# Patient Record
Sex: Male | Born: 1935 | Race: White | Hispanic: No | Smoking: Former smoker
Health system: Southern US, Community
[De-identification: ages and names within clinical notes are randomized; demographics above are authoritative.]

## PROBLEM LIST (undated history)

## (undated) DIAGNOSIS — Z8719 Personal history of other diseases of the digestive system: Secondary | ICD-10-CM

## (undated) DIAGNOSIS — Z87442 Personal history of urinary calculi: Secondary | ICD-10-CM

## (undated) DIAGNOSIS — E7800 Pure hypercholesterolemia, unspecified: Secondary | ICD-10-CM

## (undated) DIAGNOSIS — Z972 Presence of dental prosthetic device (complete) (partial): Secondary | ICD-10-CM

## (undated) DIAGNOSIS — J189 Pneumonia, unspecified organism: Secondary | ICD-10-CM

## (undated) DIAGNOSIS — IMO0002 Reserved for concepts with insufficient information to code with codable children: Secondary | ICD-10-CM

## (undated) DIAGNOSIS — I739 Peripheral vascular disease, unspecified: Secondary | ICD-10-CM

## (undated) DIAGNOSIS — E119 Type 2 diabetes mellitus without complications: Secondary | ICD-10-CM

## (undated) DIAGNOSIS — R519 Headache, unspecified: Secondary | ICD-10-CM

## (undated) DIAGNOSIS — R51 Headache: Secondary | ICD-10-CM

## (undated) DIAGNOSIS — I10 Essential (primary) hypertension: Secondary | ICD-10-CM

## (undated) DIAGNOSIS — K219 Gastro-esophageal reflux disease without esophagitis: Secondary | ICD-10-CM

## (undated) DIAGNOSIS — R42 Dizziness and giddiness: Secondary | ICD-10-CM

## (undated) HISTORY — PX: EYE SURGERY: SHX253

## (undated) HISTORY — PX: CHOLECYSTECTOMY: SHX55

## (undated) HISTORY — DX: Pure hypercholesterolemia, unspecified: E78.00

## (undated) HISTORY — PX: SKIN GRAFT: SHX250

## (undated) HISTORY — PX: DIAGNOSTIC LAPAROSCOPY: SUR761

## (undated) HISTORY — PX: VASCULAR SURGERY: SHX849

## (undated) HISTORY — PX: VEIN SURGERY: SHX48

## (undated) HISTORY — PX: MULTIPLE TOOTH EXTRACTIONS: SHX2053

---

## 2001-03-05 ENCOUNTER — Encounter: Admission: RE | Admit: 2001-03-05 | Discharge: 2001-06-03 | Payer: Self-pay | Admitting: Endocrinology

## 2001-05-15 ENCOUNTER — Ambulatory Visit (HOSPITAL_COMMUNITY): Admission: RE | Admit: 2001-05-15 | Discharge: 2001-05-15 | Payer: Self-pay | Admitting: Internal Medicine

## 2002-07-09 ENCOUNTER — Ambulatory Visit (HOSPITAL_COMMUNITY): Admission: RE | Admit: 2002-07-09 | Discharge: 2002-07-09 | Payer: Self-pay | Admitting: Family Medicine

## 2002-07-09 ENCOUNTER — Encounter: Payer: Self-pay | Admitting: Family Medicine

## 2002-07-14 ENCOUNTER — Observation Stay (HOSPITAL_COMMUNITY): Admission: RE | Admit: 2002-07-14 | Discharge: 2002-07-15 | Payer: Self-pay | Admitting: General Surgery

## 2002-08-07 ENCOUNTER — Ambulatory Visit (HOSPITAL_COMMUNITY): Admission: RE | Admit: 2002-08-07 | Discharge: 2002-08-07 | Payer: Self-pay | Admitting: Family Medicine

## 2002-08-07 ENCOUNTER — Encounter: Payer: Self-pay | Admitting: Family Medicine

## 2003-05-11 ENCOUNTER — Ambulatory Visit (HOSPITAL_COMMUNITY): Admission: RE | Admit: 2003-05-11 | Discharge: 2003-05-11 | Payer: Self-pay | Admitting: Family Medicine

## 2003-05-11 ENCOUNTER — Encounter: Payer: Self-pay | Admitting: Family Medicine

## 2003-05-19 ENCOUNTER — Ambulatory Visit (HOSPITAL_COMMUNITY): Admission: RE | Admit: 2003-05-19 | Discharge: 2003-05-19 | Payer: Self-pay | Admitting: Family Medicine

## 2003-05-19 ENCOUNTER — Encounter: Payer: Self-pay | Admitting: Family Medicine

## 2004-03-22 ENCOUNTER — Ambulatory Visit (HOSPITAL_COMMUNITY): Admission: RE | Admit: 2004-03-22 | Discharge: 2004-03-22 | Payer: Self-pay | Admitting: Family Medicine

## 2004-07-14 ENCOUNTER — Ambulatory Visit (HOSPITAL_COMMUNITY): Admission: RE | Admit: 2004-07-14 | Discharge: 2004-07-14 | Payer: Self-pay | Admitting: Family Medicine

## 2004-07-18 ENCOUNTER — Ambulatory Visit (HOSPITAL_COMMUNITY): Admission: RE | Admit: 2004-07-18 | Discharge: 2004-07-18 | Payer: Self-pay | Admitting: Family Medicine

## 2005-11-07 ENCOUNTER — Ambulatory Visit (HOSPITAL_COMMUNITY): Admission: RE | Admit: 2005-11-07 | Discharge: 2005-11-07 | Payer: Self-pay | Admitting: Family Medicine

## 2006-05-16 ENCOUNTER — Ambulatory Visit (HOSPITAL_COMMUNITY): Admission: RE | Admit: 2006-05-16 | Discharge: 2006-05-16 | Payer: Self-pay | Admitting: Family Medicine

## 2006-06-15 ENCOUNTER — Ambulatory Visit (HOSPITAL_COMMUNITY): Admission: RE | Admit: 2006-06-15 | Discharge: 2006-06-15 | Payer: Self-pay | Admitting: Urology

## 2007-01-30 ENCOUNTER — Ambulatory Visit (HOSPITAL_COMMUNITY): Admission: RE | Admit: 2007-01-30 | Discharge: 2007-01-30 | Payer: Self-pay | Admitting: Family Medicine

## 2008-12-16 ENCOUNTER — Ambulatory Visit (HOSPITAL_COMMUNITY): Admission: RE | Admit: 2008-12-16 | Discharge: 2008-12-16 | Payer: Self-pay | Admitting: Family Medicine

## 2011-05-05 NOTE — H&P (Signed)
[facility]  Patient:    [name], [name] Visit Number: [id number] MRN: [id number]          Service Type: OUT Location: RAD Attending Physician:  [name] Dictated by:   Franky Macho, M.D. Admit Date:  07/09/2002 Discharge Date: 07/09/2002   CC:         [name], M.D.   History and Physical  AGE:  77 years old.  CHIEF COMPLAINT:  Biliary colic secondary to cholelithiasis.  HISTORY OF PRESENT ILLNESS:  The patient is a 77 year old white male who is referred for evaluation and treatment of biliary colic secondary to cholelithiasis.  He has been having right upper quadrant abdominal pain with radiation to the back, nausea, vomiting, and bloating for several months.  It recently has worsened.  No fever, chills, jaundice, or history of peptic ulcer disease are noted.  PAST MEDICAL HISTORY:  Hypertension, non-insulin-dependent diabetes mellitus, psoriasis, coronary artery disease.  PAST SURGICAL HISTORY:  Colonoscopy one year ago which was unremarkable.  CURRENT MEDICATIONS: 1. Naprosyn 500 mg twice a day. 2. Methotrexate. 3. Altace 5 mg p.o. q.d. 4. Propranolol ER 80 mg p.o. q.d. 5. Hydrochlorothiazide 25 mg p.o. q.d. 6. Glucovance 2.5/500 mg p.o. q.d.  ALLERGIES:  PENICILLIN.  REVIEW OF SYSTEMS:  The patient denies any recent chest pain, shortness of breath, leg swelling, CVA, or bleeding disorder.  He does have a recent history of tachycardia, which is being handled by [facility]. He was noted to be in sinus tachycardia at 120 to 130.  PHYSICAL EXAMINATION:  GENERAL:  On physical examination, the patient is a well-developed, well-nourished white male in no acute distress.  VITAL SIGNS:  He is afebrile and pulse is noted to be 120 and regular.  HEENT:  No scleral icterus.  LUNGS:  Lungs are clear to auscultation with equal breath sounds bilaterally.  HEART:  Examination reveals a tachycardic rhythm  and without S3, S4, or murmurs.  ABDOMEN:  The abdomen is soft with tenderness noted in the right upper quadrant to palpation.  No masses or rigidity are noted.  LABORATORY AND ACCESSORY DATA:  Ultrasound of the gallbladder reveals cholelithiasis with gallbladder wall thickening.  A normal common bile duct is noted.  IMPRESSION:  Biliary colic, cholelithiasis.  PLAN:  The patient is scheduled to undergo a laparoscopic cholecystectomy on July 14, 2002; this will be done after his beta blocker medication is adjusted by [facility].  The risks and benefits of the procedure including bleeding, infection, hepatobiliary injury, and the possibility of an open procedure were fully explained to the patient, who gave informed consent. Dictated by:   Franky Macho, M.D. Attending Physician:  [name] DD:  07/10/02 TD:  07/11/02 Job: [id number]

## 2011-05-05 NOTE — Op Note (Signed)
[name], [name]                        ACCOUNT NO.:  [id number]   MEDICAL RECORD NO.:  [id number]                   PATIENT TYPE:  OBV   LOCATION:  A207                                 FACILITY:  [facility]   PHYSICIAN:  Dalia Heading, M.D.               DATE OF BIRTH:  1930-03-02   DATE OF PROCEDURE:  07/14/2002  DATE OF DISCHARGE:  07/15/2002                                 OPERATIVE REPORT   PREOPERATIVE DIAGNOSES:  Cholecystitis, cholelithiasis, methotrexate usage.   POSTOPERATIVE DIAGNOSES:  Cholecystitis, cholelithiasis, methotrexate usage.   PROCEDURE:  Laparoscopic cholecystectomy, liver biopsy.   SURGEON:  Dalia Heading, M.D.   ASSISTANT:  [name]. Leona Carry, M.D.   ANESTHESIA:  General endotracheal.   INDICATIONS FOR PROCEDURE:  The patient is a 77 year old white male who  presents with biliary colic secondary to cholelithiasis.  He also has been  on methotrexate for psoriasis.  The patient now comes to the operating room  for laparoscopic cholecystectomy with liver biopsy.  The risks and benefits  of the procedure including bleeding, infection, hepatobiliary injury, the  possibility of an open procedure were fully explained to the patient, who  gave informed consent.   DESCRIPTION OF PROCEDURE:  The patient was placed in the supine position.  After induction of general endotracheal anesthesia, the abdomen was prepped  and draped using the usual sterile technique with Betadine.  A  supraumbilical incision was made down to the fascia.  The Veress needle was  introduced into the abdominal cavity and confirmation of placement was done  using the saline drop test.  The abdomen was then insufflated to 16 mmHg  pressure.  An 11 mm trocar was introduced into the abdominal cavity under  direct visualization without difficulty.  The patient was placed in reversed  Trendelenburg position.  In addition, the 11 mm trocar was placed in the  epigastric region and 5 mm  trocars were placed in the right upper quadrant  and right flank regions.  The liver was inspected and noted to be within  normal limits.  Two TruCut needle biopsies of the liver were taken around  the gallbladder fossa.  These were sent off to pathology for further  examination due to his methotrexate usage.  Any bleeding was controlled  using Bovie electrocautery.  The gallbladder was then retracted superiorly  and laterally.  It was noted to be distended.  The cystic duct was first  identified.  The junction to the infundibulum fully identified.  Endoclips  were placed proximally and distally on the cystic duct and the cystic duct  was divided.  This was likewise done on the cystic artery.  The gallbladder  was then freed away from the gallbladder fossa using Bovie electrocautery.  The gallbladder was delivered through the epigastric trocar site using an  EndoCatch bag.  The gallbladder fossa was inspected and no abnormal bleeding  or bile leakage  was noted.  Surgicel was placed in the gallbladder fossa.  The subhepatic spaces as well as right hepatic gutter were irrigated with  normal saline.  All fluid and air were then evacuated from the abdominal  cavity prior to removal of the trocars.   All wounds were irrigated with normal saline.  All wounds were injected with  0.5% Sensorcaine.  The supraumbilical fascia was reapproximated using an 0  Vicryl interrupted suture.  All skin incisions were closed using staples.  Betadine ointment and dry sterile dressings were applied.  Needle, sponge,  and instrument count correct at the end of the procedure.  The patient  extubated in the operating room and went back to the recovery room awake and  in stable condition.  Complications were none.   SPECIMENS:  Gallbladder, liver biopsy.   BLOOD LOSS:  Minimal.                                               Dalia Heading, M.D.    MAJ/MEDQ  D:  07/14/2002  T:  07/17/2002  Job:  [id number]   cc:    Jonell Cluck, M.D.

## 2013-06-27 ENCOUNTER — Emergency Department (HOSPITAL_COMMUNITY): Payer: Medicare Other

## 2013-06-27 ENCOUNTER — Encounter (HOSPITAL_COMMUNITY): Payer: Self-pay

## 2013-06-27 ENCOUNTER — Emergency Department (HOSPITAL_COMMUNITY)
Admission: EM | Admit: 2013-06-27 | Discharge: 2013-06-27 | Disposition: A | Discharge status: Home / Self Care | Payer: Medicare Other | Attending: Emergency Medicine | Admitting: Emergency Medicine

## 2013-06-27 DIAGNOSIS — I10 Essential (primary) hypertension: Secondary | ICD-10-CM | POA: Insufficient documentation

## 2013-06-27 DIAGNOSIS — S0003XA Contusion of scalp, initial encounter: Secondary | ICD-10-CM | POA: Insufficient documentation

## 2013-06-27 DIAGNOSIS — S0181XA Laceration without foreign body of other part of head, initial encounter: Secondary | ICD-10-CM

## 2013-06-27 DIAGNOSIS — S0230XA Fracture of orbital floor, unspecified side, initial encounter for closed fracture: Secondary | ICD-10-CM | POA: Insufficient documentation

## 2013-06-27 DIAGNOSIS — Z8739 Personal history of other diseases of the musculoskeletal system and connective tissue: Secondary | ICD-10-CM | POA: Insufficient documentation

## 2013-06-27 DIAGNOSIS — R55 Syncope and collapse: Secondary | ICD-10-CM | POA: Insufficient documentation

## 2013-06-27 DIAGNOSIS — E119 Type 2 diabetes mellitus without complications: Secondary | ICD-10-CM | POA: Insufficient documentation

## 2013-06-27 DIAGNOSIS — R42 Dizziness and giddiness: Secondary | ICD-10-CM | POA: Insufficient documentation

## 2013-06-27 DIAGNOSIS — Z87891 Personal history of nicotine dependence: Secondary | ICD-10-CM | POA: Insufficient documentation

## 2013-06-27 DIAGNOSIS — W19XXXA Unspecified fall, initial encounter: Secondary | ICD-10-CM

## 2013-06-27 DIAGNOSIS — Z7982 Long term (current) use of aspirin: Secondary | ICD-10-CM | POA: Insufficient documentation

## 2013-06-27 DIAGNOSIS — S023XXA Fracture of orbital floor, initial encounter for closed fracture: Secondary | ICD-10-CM

## 2013-06-27 DIAGNOSIS — Z23 Encounter for immunization: Secondary | ICD-10-CM | POA: Insufficient documentation

## 2013-06-27 DIAGNOSIS — E785 Hyperlipidemia, unspecified: Secondary | ICD-10-CM | POA: Insufficient documentation

## 2013-06-27 DIAGNOSIS — Z79899 Other long term (current) drug therapy: Secondary | ICD-10-CM | POA: Insufficient documentation

## 2013-06-27 DIAGNOSIS — Y92009 Unspecified place in unspecified non-institutional (private) residence as the place of occurrence of the external cause: Secondary | ICD-10-CM | POA: Insufficient documentation

## 2013-06-27 DIAGNOSIS — W1809XA Striking against other object with subsequent fall, initial encounter: Secondary | ICD-10-CM | POA: Insufficient documentation

## 2013-06-27 DIAGNOSIS — Y9389 Activity, other specified: Secondary | ICD-10-CM | POA: Insufficient documentation

## 2013-06-27 DIAGNOSIS — S0180XA Unspecified open wound of other part of head, initial encounter: Secondary | ICD-10-CM | POA: Insufficient documentation

## 2013-06-27 DIAGNOSIS — Z880 Allergy status to penicillin: Secondary | ICD-10-CM | POA: Insufficient documentation

## 2013-06-27 HISTORY — DX: Type 2 diabetes mellitus without complications: E11.9

## 2013-06-27 HISTORY — DX: Dizziness and giddiness: R42

## 2013-06-27 HISTORY — DX: Essential (primary) hypertension: I10

## 2013-06-27 HISTORY — DX: Reserved for concepts with insufficient information to code with codable children: IMO0002

## 2013-06-27 MED ORDER — BACITRACIN ZINC 500 UNIT/GM EX OINT
TOPICAL_OINTMENT | CUTANEOUS | Status: AC
  Administered 2013-06-27: 1
  Filled 2013-06-27: qty 0.9

## 2013-06-27 MED ORDER — TETANUS-DIPHTH-ACELL PERTUSSIS 5-2.5-18.5 LF-MCG/0.5 IM SUSP
0.5000 mL | Freq: Once | INTRAMUSCULAR | Status: AC
  Administered 2013-06-27: 0.5 mL via INTRAMUSCULAR
  Filled 2013-06-27: qty 0.5

## 2013-06-27 MED ORDER — LIDOCAINE-EPINEPHRINE (PF) 1 %-1:200000 IJ SOLN
INTRAMUSCULAR | Status: AC
  Administered 2013-06-27: 10 mL
  Filled 2013-06-27: qty 10

## 2013-06-27 NOTE — ED Notes (Signed)
Pt skin cleaned with moist saline soaked gauze. Pt tolerated well. Pt site dressed with telfa and micropore tape.

## 2013-06-27 NOTE — ED Provider Notes (Signed)
History    This chart was scribed for Donnetta Hutching, MD, by Frederik Pear, ED scribe. The patient was seen in room AP[facility]2/AP[facility] and the patient's care was started at 1814.   CSN: [id number] Arrival date & time 06/27/13  1800  First MD Initiated Contact with Patient 06/27/13 1814     Chief Complaint  Patient presents with  . Head Injury  . Fall  . Dizziness  . Near Syncope   (Consider location/radiation/quality/duration/timing/severity/associated sxs/prior Treatment) The history is provided by the patient, medical records and a relative. No language interpreter was used.    HPI Comments: [name] is a 79 y.o. male with a h/o of Vertigo who presents to the Emergency Department complaining of a head injury that resulted in a left forehead laceration with bruising and swelling over the left eye that began at 17:30 while he was working in his yard and bent over to pull a patch of grass and suddenly became dizzy, which caused him to fall and hit his head on the concrete. Denies LOC. He denies neck pain, extremity pain, buccal pain. In ED, the laceration is hemostatic and wrapped with a bandage. Denies taking blood thinners. Reports he takes an aspirin daily.  H/o of DM and hyperlipidemia.   Past Medical History  Diagnosis Date  . Hypertension   . Diabetes mellitus without complication   . DDD (degenerative disc disease)   . Vertigo    Past Surgical History  Procedure Laterality Date  . Cholecystectomy     History reviewed. No pertinent family history. History  Substance Use Topics  . Smoking status: Former Games developer  . Smokeless tobacco: Not on file  . Alcohol Use: No    Review of Systems A complete 10 system review of systems was obtained and all systems are negative except as noted in the HPI and PMH.  Allergies  Penicillins  Home Medications   Current Outpatient Rx  Name  Route  Sig  Dispense  Refill  . Choline Fenofibrate (TRILIPIX PO)   Oral   Take 1 tablet by  mouth daily.         . metFORMIN (GLUCOPHAGE) 500 MG tablet   Oral   Take 500 mg by mouth 2 (two) times daily.         . methotrexate 2.5 MG tablet   Oral   Take 10 mg by mouth once a week. Takes every saturday         . OVER THE COUNTER MEDICATION   Oral   Take 1 tablet by mouth daily.         Marland Kitchen oxyCODONE-acetaminophen (PERCOCET/ROXICET) 5-325 MG per tablet   Oral   Take 1 tablet by mouth every 4 (four) hours as needed for pain.         Marland Kitchen propranolol (INDERAL) 80 MG tablet   Oral   Take 80 mg by mouth daily.         . rosuvastatin (CRESTOR) 10 MG tablet   Oral   Take 10 mg by mouth daily.         Marland Kitchen telmisartan (MICARDIS) 80 MG tablet   Oral   Take 80 mg by mouth daily.          BP 167/79  Pulse 89  Temp(Src) 98.1 F (36.7 C) (Oral)  Resp 20  Ht 6' (1.829 m)  Wt 196 lb (88.905 kg)  BMI 26.58 kg/m2  SpO2 95% Physical Exam  Nursing note and vitals reviewed. Constitutional:  He is oriented to person, place, and time. He appears well-developed and well-nourished.  HENT:  Head: Normocephalic and atraumatic.  5cm Y-shaped laceration to left forehead. Ecchymosis and swelling around the left eye. No left buccal tenderness.  Eyes: Conjunctivae and EOM are normal. Pupils are equal, round, and reactive to light.  Neck: Normal range of motion. Neck supple. No muscular tenderness present.  Neck is non-tender.  Cardiovascular: Normal rate, regular rhythm and normal heart sounds.   Pulmonary/Chest: Effort normal and breath sounds normal.  Abdominal: Soft. Bowel sounds are normal.  Musculoskeletal: Normal range of motion. He exhibits no tenderness.  No extremity tenderness.  Neurological: He is alert and oriented to person, place, and time.  Skin: Skin is warm and dry.  Psychiatric: He has a normal mood and affect.   ED Course  Procedures (including critical care time)  LACERATION REPAIR PROCEDURE NOTE The patient's identification was confirmed and consent  was obtained. This procedure was performed by Donnetta Hutching, MD at 20:03. Site: left forehead Sterile procedures observed: irrigated with normal saline Anesthetic used (type and amt): 7 cc of 1% xylocaine with epinephrine.  Suture type/size: 5/0 nylon Length: 5 cm # of Sutures: 7 Technique: simple interrupted Complexity: complex Antibx ointment applied: Neosporin Tetanus UTD or ordered: Ordered Site anesthetized, irrigated with NS, explored without evidence of foreign body, wound well approximated, site covered with dry, sterile dressing.  Patient tolerated procedure well without complications. Instructions for care discussed verbally and patient provided with additional written instructions for homecare and f/u.  DIAGNOSTIC STUDIES: Oxygen Saturation is 95% on room air, adequate by my interpretation.    COORDINATION OF CARE:  18:18- Discussed planned course of treatment with the patient, including repairing the laceration and maxillofacial and head CTs , who is agreeable at this time.  20:03- Repaired laceration. Pt reports his tetanus is not UTD.  Will order.  20:22- Discussed plan for discharge including suture removal in 7 days and taking NSAIDS as needed for pain.   Labs Reviewed - No data to display Ct Head Wo Contrast  06/27/2013   *RADIOLOGY REPORT*  Clinical Data:  Fall.  Head injury  CT HEAD WITHOUT CONTRAST CT CERVICAL SPINE WITHOUT CONTRAST  Technique:  Multidetector CT imaging of the head and cervical spine was performed following the standard protocol without intravenous contrast.  Multiplanar CT image reconstructions of the cervical spine were also generated.  Comparison:  None  CT HEAD  Findings: Generalized atrophy.  No acute infarct.  Negative for hemorrhage or mass.  Fracture of the left orbital floor.  See separate CT face report. There is fluid in the left maxillary sinus.  IMPRESSION: Generalized atrophy.  No acute intracranial abnormality.  CT CERVICAL SPINE   Findings: There is a markedly depressed fracture of the orbital floor on the left.  There is herniation of orbital fat and a portion of the inferior rectus into the fracture defect.  There is blood filling most of the left maxillary sinus.  There is soft tissue swelling overlying the left eye.  No other fracture identified in the face.  Degenerative changes present in the TMJ bilaterally.  IMPRESSION: Markedly depressed fracture of the left orbital floor with herniation of orbital fat and inferior rectus into the fracture defect.   Original Report Authenticated By: Janeece Riggers, M.D.   Ct Maxillofacial Wo Cm  06/27/2013   *RADIOLOGY REPORT*  Clinical Data:  Fall.  Head injury  CT HEAD WITHOUT CONTRAST CT CERVICAL SPINE WITHOUT CONTRAST  Technique:  Multidetector CT imaging of the head and cervical spine was performed following the standard protocol without intravenous contrast.  Multiplanar CT image reconstructions of the cervical spine were also generated.  Comparison:  None  CT HEAD  Findings: Generalized atrophy.  No acute infarct.  Negative for hemorrhage or mass.  Fracture of the left orbital floor.  See separate CT face report. There is fluid in the left maxillary sinus.  IMPRESSION: Generalized atrophy.  No acute intracranial abnormality.  CT CERVICAL SPINE  Findings: There is a markedly depressed fracture of the orbital floor on the left.  There is herniation of orbital fat and a portion of the inferior rectus into the fracture defect.  There is blood filling most of the left maxillary sinus.  There is soft tissue swelling overlying the left eye.  No other fracture identified in the face.  Degenerative changes present in the TMJ bilaterally.  IMPRESSION: Markedly depressed fracture of the left orbital floor with herniation of orbital fat and inferior rectus into the fracture defect.   Original Report Authenticated By: Janeece Riggers, M.D.   No diagnosis found.  MDM   CT scans of head face and cervical  spine reveal a depressed fracture of the left orbital floor.  There is also herniation of orbital fat and a portion of the inferior rectus into the fracture defect.   Patient has normal extraocular movements. I discussed this scenario with the otolaryngologist on call Dr. Jearld Fenton.  His opinion was that the patient could be seen as an outpatient next week.   Laceration was repaired.  CT findings were discussed with the patient and his son and grandson. He understands to get ENT followup.   Sutures out one week.    I personally performed the services described in this documentation, which was scribed in my presence. The recorded information has been reviewed and is accurate.    Donnetta Hutching, MD 06/28/13 [phone]

## 2013-06-27 NOTE — ED Notes (Signed)
Was outside in the driveway and reached over to pick up a piece of glass, everything started spinning and down I went per pt. Laceration above left eye, bandaged and bleeding controlled. Left eye swollen and black, abrasions to left cheek.

## 2015-07-26 ENCOUNTER — Ambulatory Visit (INDEPENDENT_AMBULATORY_CARE_PROVIDER_SITE_OTHER): Payer: Medicare Other | Admitting: Neurology

## 2015-07-26 ENCOUNTER — Ambulatory Visit (INDEPENDENT_AMBULATORY_CARE_PROVIDER_SITE_OTHER): Payer: Self-pay | Admitting: Neurology

## 2015-07-26 DIAGNOSIS — R200 Anesthesia of skin: Secondary | ICD-10-CM

## 2015-07-26 DIAGNOSIS — M5417 Radiculopathy, lumbosacral region: Secondary | ICD-10-CM | POA: Diagnosis not present

## 2015-07-26 DIAGNOSIS — R269 Unspecified abnormalities of gait and mobility: Secondary | ICD-10-CM

## 2015-07-26 DIAGNOSIS — Z0289 Encounter for other administrative examinations: Secondary | ICD-10-CM

## 2015-07-26 DIAGNOSIS — M5412 Radiculopathy, cervical region: Secondary | ICD-10-CM

## 2015-07-26 DIAGNOSIS — R2689 Other abnormalities of gait and mobility: Secondary | ICD-10-CM | POA: Diagnosis not present

## 2015-07-26 NOTE — Progress Notes (Signed)
Electrodiagnostic study today showed mild length dependent axonal peripheral neuropathy, evidence of chronic bilateral lumbosacral radiculopathy, chronic mild left cervical radiculopathy.

## 2015-07-26 NOTE — Procedures (Signed)
   NCS (NERVE CONDUCTION STUDY) WITH EMG (ELECTROMYOGRAPHY) REPORT   STUDY DATE: August 8th 2016 PATIENT NAME: [name] DOB: 29-Aug-1930 MRN: [id number]    TECHNOLOGIST: Gearldine Shown ELECTROMYOGRAPHER: Levert Feinstein M.D.  CLINICAL INFORMATION:  81 years old male, with gradual onset gait difficulty, sudden worsening since May 2016, known history of cervical myelopathy  FINDINGS: NERVE CONDUCTION STUDY: Right sural, peroneal sensory responses were absent.  Right tibial motor response was absent. Right peroneal to EDB motor response showed severely decreased C map amplitude, with normal distal latency, conduction velocity.  Bilateral ulnar sensory and motor responses were normal. Bilateral median sensory response showed moderately prolonged distal latency, with well-preserved snap amplitude.  Bilateral median motor responses showed moderately prolonged distal latency, was normal C map amplitude, conduction velocity, mild to moderately prolonged F-wave latency.    NEEDLE ELECTROMYOGRAPHY: Selected needle examination was performed at bilateral lower extremity muscles, bilateral lumbar sacral paraspinal muscles, left upper extremities, left cervical paraspinal muscles.   Bilateral tibialis anterior: Increased insertional activity, 1 plus spontaneous activity, enlarge complex motor unit potential, with mildly decreased recruitment patterns.  Left peroneal longus, medial gastrocnemius, tibialis posterior, vastus lateralis: Normal insertion activity, no spontaneous activity, enlarge complex motor unit potential, with mildly decreased recruitment patterns.  Right tibialis posterior, right vastus lateralis: Normal insertion activity, no spontaneous activity, mildly enlarged motor unit potential, with mildly decreased recruitment patterns.  He has increased insertional activity at bilateral lumbosacral paraspinal muscles, at L4, L5, S1. There was 1 plus spontaneous activity at right  L5.  Left pronator teres, biceps, triceps, deltoid, extensor digitorum communis: Normal insertion activity, no spontaneous activity, mildly enlarged complex motor unit potential, with mildly decreased recruitment patterns.      There was no spontaneous activity at left cervical paraspinal muscles, left C5, C6, C7.   IMPRESSION:   This is an abnormal study. 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 There was also evidence of chronic neuropathic changes involving bilateral lumbosacral myotomes, suggestive of chronic bilateral lumbosacral radiculopathy involving bilateral L4-5 S1 myotomes.  There was evidence of chronic left cervical radiculopathy, involving left C5, C6, 7 myotomes.    INTERPRETING PHYSICIAN:   Levert Feinstein M.D. Ph.D. [facility] [address], [address] [place], [place] [address] [phone]

## 2015-07-28 ENCOUNTER — Encounter: Payer: Self-pay | Admitting: Neurology

## 2015-07-28 ENCOUNTER — Ambulatory Visit (INDEPENDENT_AMBULATORY_CARE_PROVIDER_SITE_OTHER): Payer: Medicare Other | Admitting: Neurology

## 2015-07-28 VITALS — BP 169/76 | HR 68 | Ht 72.0 in | Wt 169.5 lb

## 2015-07-28 DIAGNOSIS — R32 Unspecified urinary incontinence: Secondary | ICD-10-CM | POA: Diagnosis not present

## 2015-07-28 DIAGNOSIS — R200 Anesthesia of skin: Secondary | ICD-10-CM | POA: Diagnosis not present

## 2015-07-28 DIAGNOSIS — R269 Unspecified abnormalities of gait and mobility: Secondary | ICD-10-CM

## 2015-07-28 NOTE — Progress Notes (Signed)
PATIENT: [name] DOB: 1930-06-23  Chief Complaint  Patient presents with  . Peripheral Neuropathy    He is here with his grandson, [name].  He is having numbness in his hands and feet.  He is unable to use his hands and has difficulty walking.  They would like to discuss his EMG/NCV results.     HISTORICAL  Quaran [name] is 81 years old ambidextrous male, accompanied by his grandson [name], seen in refer by Dy. Kohut for evaluation of difficulty walking, bilateral hands clumsiness, urinary incontinence   He has past medical history of hypertension, psoriatic arthritis,on chronic methotrexate treatment, hyperlipidemia, diabetes, known history of cervical spondylitic myelopathy, lumbar radiculopathy, was previously evaluated by neurosurgeon Dr. Trey Sailors, surgical decompression was offered  without dedicated ago, but patient decided to wait and see, he presented with gait difficulty severe low back pain then, which has gradually improved,  He was highly functional until around 2 years ago in 2014, he had a gradual onset gait difficulty, much worsened since June of 2016, he noticed dense numbness from waist down, spreading caudally to involve bilateral lower extremity, bilateral foot, worsening low back pain, to the point that he had fell down multiple times, numbness and clumsiness of his hands, dropping things from both hands, worsening urinary urgency, occasionally bowel and bladder incontinence, Electrodiagnostic study in August 2016 showed evidence of chronic bilateral lumbar radiculopathy, evidence of left cervical radiculopathy  He denies swallowing difficulty, mild slurred speech he attributed to his denture, no visual loss no memory loss  Review of system: 14 system review of the system was performed, only abnormality was numbness, weakness, dizziness, unsteady gait frequent falling     Allergies  Allergen Reactions  . Penicillins Other (See Comments)    unknown     HOME MEDICATIONS: Current Outpatient Prescriptions  Medication Sig Dispense Refill  . aspirin 81 MG tablet Take 81 mg by mouth daily.    . folic acid (FOLVITE) 1 MG tablet daily.    . hydrochlorothiazide (HYDRODIURIL) 25 MG tablet as needed.    . metFORMIN (GLUCOPHAGE) 500 MG tablet Take 500 mg by mouth 2 (two) times daily.    . methotrexate 2.5 MG tablet Take 10 mg by mouth once a week. Takes every saturday    . oxyCODONE-acetaminophen (PERCOCET/ROXICET) 5-325 MG per tablet Take 1 tablet by mouth every 4 (four) hours as needed for pain.    Marland Kitchen propranolol (INDERAL) 80 MG tablet Take 80 mg by mouth daily.    . rosuvastatin (CRESTOR) 10 MG tablet Take 10 mg by mouth daily.    Marland Kitchen telmisartan (MICARDIS) 80 MG tablet Take 80 mg by mouth daily.    . TRILIPIX 135 MG capsule      No current facility-administered medications for this visit.    PAST MEDICAL HISTORY: Past Medical History  Diagnosis Date  . Hypertension   . Diabetes mellitus without complication   . DDD (degenerative disc disease)   . Vertigo   . High cholesterol     PAST SURGICAL HISTORY: Past Surgical History  Procedure Laterality Date  . Cholecystectomy    . Skin graft      Left leg  . Vein surgery      FAMILY HISTORY: No family history on file.  SOCIAL HISTORY:  Social History   Social History  . Marital Status: Widowed    Spouse Name: N/A  . Number of Children: 1  . Years of Education: GED   Occupational  History  . Retired    Social History Main Topics  . Smoking status: Former Games developer  . Smokeless tobacco: Not on file     Comment: Quit smoking in 1979  . Alcohol Use: No  . Drug Use: No  . Sexual Activity: Not on file   Other Topics Concern  . Not on file   Social History Narrative   Lives at home with a caregiver.   Right-handed.   Occasional caffeine use.     PHYSICAL EXAM   Filed Vitals:   07/28/15 1216  BP: 169/76  Pulse: 68  Height: 6' (1.829 m)  Weight: 169 lb 8 oz  (76.885 kg)    Not recorded      Body mass index is 22.98 kg/(m^2).  PHYSICAL EXAMNIATION:  Gen: NAD, conversant, well nourised, obese, well groomed                     Cardiovascular: Regular rate rhythm, no peripheral edema, warm, nontender. Eyes: Conjunctivae clear without exudates or hemorrhage Neck: Supple, no carotid bruise. Pulmonary: Clear to auscultation bilaterally   NEUROLOGICAL EXAM:  MENTAL STATUS: Speech:    Speech is normal; fluent and spontaneous with normal comprehension.  Cognition:     Orientation to time, place and person     Normal recent and remote memory     Normal Attention span and concentration     Normal Language, naming, repeating,spontaneous speech     Fund of knowledge   CRANIAL NERVES: CN II: Visual fields are full to confrontation. Pupil equal round reactive to light. CN V: Facial sensation is intact to pinprick in all 3 divisions bilaterally. Corneal responses are intact.  CN VII: Face is symmetric with normal eye closure and smile. CN VIII: Hearing is normal to rubbing fingers CN IX, X: Palate elevates symmetrically. Phonation is normal. CN XI: Head turning and shoulder shrug are intact CN XII: Tongue is midline with normal movements and no atrophy.  MOTOR: Mild upper extremity spasticity, proximal muscle strength is normal, mild bilateral hand grip weakness, 4 plus out of 5, mild to moderate bilateral lower extremity spasticity, mild bilateral hip flexion, mild to moderate bilateral ankle dorsiflexion weakness  REFLEXES: Reflexes are  to  symmetric at the biceps, triceps,  2 at knees, and ankles. Plantar responses areextensor bilaterally.  SENSORY: Intact to light touch, pinprick, position sense, and vibration sense are intact in fingers and toes.  COORDINATION:  There is no dysmetria on finger-to-nose and heel-knee-shin.    GAIT/STANCE: He needs assistance to get up from seated position, wide-based, stiff, very unsteady  gait  DIAGNOSTIC DATA (LABS, IMAGING, TESTING) - I reviewed patient records, labs, notes, testing and imaging myself where available.   ASSESSMENT AND PLAN  [name] is a 81 y.o. male with numbness from with down, progressive gait difficulty, clumsiness of bilateral hands  Potential localization to cervical spondylitic myelopathy, thoracic myelopathy, lumbar radiculopathy  MRI of cervical, thoracic, lumbar spine  Return to clinic in one week   Levert Feinstein, M.D. Ph.D.  [facility] [address] [place], [place] [address] Ph: [phone] Fax: [phone]  CC: To Dr. Darci Needle

## 2015-08-12 ENCOUNTER — Ambulatory Visit
Admission: RE | Admit: 2015-08-12 | Discharge: 2015-08-12 | Disposition: A | Discharge status: Home / Self Care | Payer: Medicare Other | Source: Ambulatory Visit | Attending: Neurology | Admitting: Neurology

## 2015-08-12 DIAGNOSIS — R32 Unspecified urinary incontinence: Secondary | ICD-10-CM

## 2015-08-12 DIAGNOSIS — R200 Anesthesia of skin: Secondary | ICD-10-CM

## 2015-08-12 DIAGNOSIS — R269 Unspecified abnormalities of gait and mobility: Secondary | ICD-10-CM

## 2015-08-16 ENCOUNTER — Other Ambulatory Visit: Payer: Self-pay | Admitting: *Deleted

## 2015-08-16 ENCOUNTER — Telehealth: Payer: Self-pay | Admitting: Neurology

## 2015-08-16 ENCOUNTER — Other Ambulatory Visit: Payer: Self-pay | Admitting: Neurology

## 2015-08-16 DIAGNOSIS — M4802 Spinal stenosis, cervical region: Secondary | ICD-10-CM

## 2015-08-16 DIAGNOSIS — M48061 Spinal stenosis, lumbar region without neurogenic claudication: Secondary | ICD-10-CM

## 2015-08-16 DIAGNOSIS — G9520 Unspecified cord compression: Secondary | ICD-10-CM

## 2015-08-16 NOTE — Telephone Encounter (Signed)
Left message for [name] [phone]) to return my call.  Per Dr. Terrace Arabia, his grandfather will need to see a neurosurgeon.  Need to see if they have someone particular in mind or would [facility] be ok?  Dr. Terrace Arabia still wants him to keep his appoinment here to discuss him MRI on 08/24/15.

## 2015-08-16 NOTE — Telephone Encounter (Signed)
Spoke to [facility] - they would like a referral to [facility].  Faxed over an urgent request.

## 2015-08-16 NOTE — Telephone Encounter (Signed)
I have called, left message at both numbers for discussion.  Please tried to reach patient again, Significant abnormalities at MRI cervical and Lumbar spines. MRI-C, severe canal stenosis with cord compression at C2-3, C4-5. MRI-L, severe spinal stenosis at L3-4, L4-5 level with compression of nerve roots.  Degenerative changes to thoracic spines, no cord compression there.  IMPRESSION: This is an abnormal MRI of the lumbar spine showing the following: 1. Very severe spinal stenosis at L3-L4 could lead to a cauda equina syndrome or compression of any of the exiting or traversing nerve roots 2. Severe spinal stenosis at L4-L5 level compression of the exiting L4 nerve roots and the traversing left L5 nerve root 3. Moderate degenerative changes at L5-S1 with less potential for nerve root compression.  This is an abnormal MRI of the cervical spine without contrast showing the following: 1. There is severe spinal stenosis leading to spinal cord compression at C2-C3 and C4-C5. At C4-C5, there is hyperintense signal within the spinal cord superior to the point of maximum stenosis. At C2-C3, there is a hint of hyperintense signal within the spinal cord adjacent to the point of maximum stenosis. 2. There is pannus at the atlantoaxial joint adjacent to C1-C2 of a degenerative or inflammatory nature. 3. There is moderate to severe degenerative change at every cervical level. There could be right C4 nerve root compression at C3-C4, compression of either of the C5 nerve roots at C4-C5 and the right C6 nerve root at C5-C6.  4. There are extensive bridging osteophytes anteriorly and there appears to be fusion from C4-T1 of a degenerative nature.

## 2015-08-16 NOTE — Progress Notes (Signed)
Urgent neurosurgery referral placed on behalf of Dr. Terrace Arabia for patient with severe cervical spinal stenosis and cord compression at multiple levels. Also, severe lumbar spinal stenosis. MRI of the entire spine from 08/12/2015.

## 2015-08-16 NOTE — Telephone Encounter (Signed)
Spoke to his grandson, [name] (on HIP[employer] at [phone]), who will be bringing him to his appointment on 08/24/15 to further discuss his MRI findings and treatment options.  He is aware of results and verbalized understanding.

## 2015-08-18 NOTE — Telephone Encounter (Signed)
[facility] - patient has an appt with Dr. Dutch Quint on 08/19/15 at 10:45am.

## 2015-08-19 ENCOUNTER — Telehealth: Payer: Self-pay | Admitting: *Deleted

## 2015-08-19 ENCOUNTER — Other Ambulatory Visit: Payer: Self-pay | Admitting: Neurosurgery

## 2015-08-19 NOTE — Telephone Encounter (Signed)
Spoke with [name] (pt's grandson) - Mr. Valletta has surgery scheduled with Dr. Dutch Quint at [facility] on 09/06/15 at 11am. His 08/24/15 follow up appt will be canceled here.

## 2015-08-24 ENCOUNTER — Ambulatory Visit: Payer: Medicare Other | Admitting: Neurology

## 2015-09-02 ENCOUNTER — Encounter (HOSPITAL_COMMUNITY)
Admission: RE | Admit: 2015-09-02 | Discharge: 2015-09-02 | Disposition: A | Discharge status: Home / Self Care | Payer: Medicare Other | Source: Ambulatory Visit | Attending: Neurosurgery | Admitting: Neurosurgery

## 2015-09-02 ENCOUNTER — Encounter (HOSPITAL_COMMUNITY): Payer: Self-pay

## 2015-09-02 DIAGNOSIS — K219 Gastro-esophageal reflux disease without esophagitis: Secondary | ICD-10-CM | POA: Diagnosis not present

## 2015-09-02 DIAGNOSIS — Z01812 Encounter for preprocedural laboratory examination: Secondary | ICD-10-CM | POA: Insufficient documentation

## 2015-09-02 DIAGNOSIS — Z79899 Other long term (current) drug therapy: Secondary | ICD-10-CM | POA: Diagnosis not present

## 2015-09-02 DIAGNOSIS — E119 Type 2 diabetes mellitus without complications: Secondary | ICD-10-CM | POA: Diagnosis not present

## 2015-09-02 DIAGNOSIS — M4800 Spinal stenosis, site unspecified: Secondary | ICD-10-CM | POA: Insufficient documentation

## 2015-09-02 DIAGNOSIS — Z87891 Personal history of nicotine dependence: Secondary | ICD-10-CM | POA: Diagnosis not present

## 2015-09-02 DIAGNOSIS — I4510 Unspecified right bundle-branch block: Secondary | ICD-10-CM | POA: Insufficient documentation

## 2015-09-02 DIAGNOSIS — E785 Hyperlipidemia, unspecified: Secondary | ICD-10-CM | POA: Diagnosis not present

## 2015-09-02 DIAGNOSIS — Z7982 Long term (current) use of aspirin: Secondary | ICD-10-CM | POA: Insufficient documentation

## 2015-09-02 DIAGNOSIS — I10 Essential (primary) hypertension: Secondary | ICD-10-CM | POA: Diagnosis not present

## 2015-09-02 DIAGNOSIS — Z01818 Encounter for other preprocedural examination: Secondary | ICD-10-CM | POA: Diagnosis present

## 2015-09-02 DIAGNOSIS — I739 Peripheral vascular disease, unspecified: Secondary | ICD-10-CM | POA: Diagnosis not present

## 2015-09-02 HISTORY — DX: Personal history of urinary calculi: Z87.442

## 2015-09-02 HISTORY — DX: Headache: R51

## 2015-09-02 HISTORY — DX: Peripheral vascular disease, unspecified: I73.9

## 2015-09-02 HISTORY — DX: Headache, unspecified: R51.9

## 2015-09-02 HISTORY — DX: Presence of dental prosthetic device (complete) (partial): Z97.2

## 2015-09-02 HISTORY — DX: Pneumonia, unspecified organism: J18.9

## 2015-09-02 HISTORY — DX: Gastro-esophageal reflux disease without esophagitis: K21.9

## 2015-09-02 HISTORY — DX: Personal history of other diseases of the digestive system: Z87.19

## 2015-09-02 LAB — BASIC METABOLIC PANEL
Anion gap: 7 (ref 5–15)
BUN: 12 mg/dL (ref 6–20)
CALCIUM: 9.3 mg/dL (ref 8.9–10.3)
CO2: 28 mmol/L (ref 22–32)
CREATININE: 0.86 mg/dL (ref 0.61–1.24)
Chloride: 103 mmol/L (ref 101–111)
Glucose, Bld: 86 mg/dL (ref 65–99)
Potassium: 4.5 mmol/L (ref 3.5–5.1)
SODIUM: 138 mmol/L (ref 135–145)

## 2015-09-02 LAB — CBC WITH DIFFERENTIAL/PLATELET
BASOS PCT: 0 %
Basophils Absolute: 0 10*3/uL (ref 0.0–0.1)
EOS ABS: 0.2 10*3/uL (ref 0.0–0.7)
Eosinophils Relative: 2 %
HCT: 46.6 % (ref 39.0–52.0)
HEMOGLOBIN: 15.6 g/dL (ref 13.0–17.0)
Lymphocytes Relative: 22 %
Lymphs Abs: 2 10*3/uL (ref 0.7–4.0)
MCH: 30.4 pg (ref 26.0–34.0)
MCHC: 33.5 g/dL (ref 30.0–36.0)
MCV: 90.8 fL (ref 78.0–100.0)
MONOS PCT: 7 %
Monocytes Absolute: 0.7 10*3/uL (ref 0.1–1.0)
NEUTROS PCT: 69 %
Neutro Abs: 6.2 10*3/uL (ref 1.7–7.7)
Platelets: 143 10*3/uL — ABNORMAL LOW (ref 150–400)
RBC: 5.13 MIL/uL (ref 4.22–5.81)
RDW: 15 % (ref 11.5–15.5)
WBC: 9.1 10*3/uL (ref 4.0–10.5)

## 2015-09-02 LAB — GLUCOSE, CAPILLARY: GLUCOSE-CAPILLARY: 88 mg/dL (ref 65–99)

## 2015-09-02 LAB — SURGICAL PCR SCREEN
MRSA, PCR: NEGATIVE
STAPHYLOCOCCUS AUREUS: NEGATIVE

## 2015-09-02 NOTE — Progress Notes (Signed)
Patient states he has had ECHO/Stress tests done in the past, most likely over 10 years ago but cannot remember why, where, or when.  He also states he has been told he has an irregular HB but unsure exactly what.  Will ask Colette Ribas, MD PCP if they have any record of these tests and for last office visit note.

## 2015-09-02 NOTE — Pre-Procedure Instructions (Signed)
[name]  09/02/2015      [facility] - [place], [place] - [address] [place] [address] Phone: [phone] Fax: [phone]    Your procedure is scheduled on Monday September 06 2015 at 8am.  Report to [facility] Admitting at 600 A.M.  Call this number if you have problems the morning of surgery:  [phone]   Remember:  Do not eat food or drink liquids after midnight.  Take these medicines the morning of surgery with A SIP OF WATER Propanolol (Inderal).  If needed- Percocet (Oxycodone-acetaminophen)    STOP: ALL Vitamins, Supplements, Effient and Herbal Medications, Fish Oils, Aspirins, NSAIDs (Nonsteroidal Anti-inflammatories such as Ibuprofen, Aleve, or Advil), and Goody's/BC Powders 7 days prior to surgery (today Thursday Sept. 15), until after surgery as directed by your physician. This includes: Aspirin, Folic Acid.  Do not take metformin (diabetes pill) the morning of surgery.  How to Manage Your Diabetes Before Surgery   Why is it important to control my blood sugar before and after surgery?   Improving blood sugar levels before and after surgery helps healing and can limit problems.  A way of improving blood sugar control is eating a healthy diet by:  - Eating less sugar and carbohydrates  - Increasing activity/exercise  - Talk with your doctor about reaching your blood sugar goals  High blood sugars (greater than 180 mg/dL) can raise your risk of infections and slow down your recovery so you will need to focus on controlling your diabetes during the weeks before surgery.  Make sure that the doctor who takes care of your diabetes knows about your planned surgery including the date and location.  How do I manage my blood sugars before surgery?   Check your blood sugar at least 4 times a day, 2 days before surgery to make sure that they are not too high or low.   Check your blood sugar the morning of your  surgery when you wake up and every 2               hours until you get to the Short-Stay unit.  If your blood sugar is less than 70 mg/dL, you will need to treat for low blood sugar by:  Treat a low blood sugar (less than 70 mg/dL) with 1/2 cup of clear juice (cranberry or apple), 4 glucose tablets, OR glucose gel.  Recheck blood sugar in 15 minutes after treatment (to make sure it is greater than 70 mg/dL).  If blood sugar is not greater than 70 mg/dL on re-check, call [phone] for further instructions.   Report your blood sugar to the Short-Stay nurse when you get to Short-Stay.  References:  [facility], 2007 "How to Manage your Diabetes Before and After Surgery".  What do I do about my diabetes medications?   Do not take oral diabetes medicines (pills) the morning of surgery. (Metformin)     Do not wear jewelry.  Do not wear lotions, powders, or perfumes.  You may wear deodorant.  Men may shave face and neck.  Do not bring valuables to the hospital.  [facility] is not responsible for any belongings or valuables.  Contacts, dentures or bridgework may not be worn into surgery.  Leave your suitcase in the car.  After surgery it may be brought to your room.  For patients admitted to the hospital, discharge time will be determined by your treatment  team.  Patients discharged the day of surgery will not be allowed to drive home.   Special instructions:  Please follow these instructions carefully:  1. Shower with CHG Soap the night before surgery and the morning of Surgery. 2. If you choose to wash your hair, wash your hair first as usual with your normal shampoo. 3. After you shampoo, rinse your hair and body thoroughly to remove the Shampoo. 4. Use CHG as you would any other liquid soap. You can apply chg directly to the skin and wash gently with scrungie or a clean washcloth. 5.  Apply the CHG Soap to your body ONLY FROM THE NECK DOWN. Do not use on open wounds or open sores. Avoid contact with your eyes, ears, mouth and genitals (private parts). Wash genitals (private parts) with your normal soap. 6. Wash thoroughly, paying special attention to the area where your surgery will be performed. 7. Thoroughly rinse your body with warm water from the neck down. 8. DO NOT shower/wash with your normal soap after using and rinsing off the CHG Soap. 9. Pat yourself dry with a clean towel.  10. Wear clean pajamas.  11. Place clean sheets on your bed the night of your first shower and do not sleep with pets.  Day of Surgery  Do not apply any lotions/deodorants the morning of surgery. Please wear clean clothes to the hospital/surgery center.    Please read over the following fact sheets that you were given. Pain Booklet, Coughing and Deep Breathing, MRSA Information and Surgical Site Infection Prevention

## 2015-09-03 LAB — HEMOGLOBIN A1C
HEMOGLOBIN A1C: 6.1 % — AB (ref 4.8–5.6)
MEAN PLASMA GLUCOSE: 128 mg/dL

## 2015-09-03 NOTE — Progress Notes (Signed)
Anesthesia Chart Review: Patient is a 81 year old male scheduled for C2-3, C4-5 ACDF, L3-424 decompressive laminectomy 09/06/15 by Dr. Jordan Likes.  History includes former smoker, HTN, DM2, vertigo, severe spinal stenosis, hypercholesterolemia, GERD, PVD with "vein" surgery, cholecystectomy '03, skin graft left leg, psoriatic arthritis (methotrexate), dentures. PCP is Dr. Darci Needle. Neurologist is Dr. Levert Feinstein.  Meds include ASA 81 mg, Folvite, HCTZ, metformin, methotrexate, Percocet, Inderal, Crestor, Micardis.  09/02/15 EKG: SB at 52 bpm, right BBB, left atrial enlargement. No previous tracing in [place] or [address]. Patient reports a prior stress and echo, but likely > 10 years ago.   05/16/06 Carotid duplex: No hemodynamically significant ICA stenosis.  08/12/15 MRI: C-spine: IMPRESSION: This is an abnormal MRI of the cervical spine without contrast showing the following: 1. There is severe spinal stenosis leading to spinal cord compression at C2-C3 and C4-C5. At C4-C5, there is hyperintense signal within the spinal cord superior to the point of maximum stenosis. At C2-C3, there is a hint of hyperintense signal within the spinal cord adjacent to the point of maximum stenosis. 2. There is pannus at the atlantoaxial joint adjacent to C1-C2 of a degenerative or inflammatory nature. 3. There is moderate to severe degenerative change at every cervical level. There could be right C4 nerve root compression at C3-C4, compression of either of the C5 nerve roots at C4-C5 and the right C6 nerve root at C5-C6.  4. There are extensive bridging osteophytes anteriorly and there appears to be fusion from C4-T1 of a degenerative nature. T-spine: IMPRESSION: This is an abnormal MRI of the thoracic spine showing multilevel degenerative changes as detailed above. The spinal cord appears normal and there is no evidence of spinal cord compression, though there is mild spinal stenosis at T7-T8 due to posterior disc  protrusion and endplate degenerative changes and facet hypertrophy with joint effusions. 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 L-spine: IMPRESSION: This is an abnormal MRI of the lumbar spine showing the following: 1. Very severe spinal stenosis at L3-L4 could lead to a cauda equina syndrome or compression of any of the exiting or traversing nerve roots 2. Severe spinal stenosis at L4-L5 level compression of the exiting L4 nerve roots and the traversing left L5 nerve root 3. Moderate degenerative changes at L5-S1 with less potential for nerve root compression.  Preoperative labs noted. A1C 6.1.   Patient with severe cervical and lumbar stenosis. EKG shows right BBB. No comparison currently available. No CV symptoms documented. Further evaluation by his assigned anesthesiologist on the day of surgery, but if no acute changes then I would anticipate that he could proceed as planned.   Velna Ochs [facility] Short Stay Center/Anesthesiology Phone [phone] 09/03/2015 10:07 AM

## 2015-09-06 ENCOUNTER — Inpatient Hospital Stay (HOSPITAL_COMMUNITY)
Admission: RE | Admit: 2015-09-06 | Discharge: 2015-09-27 | DRG: 471 | Disposition: A | Discharge status: Skilled Nursing Facility | Payer: Medicare Other | Source: Ambulatory Visit | Attending: Neurosurgery | Admitting: Neurosurgery

## 2015-09-06 ENCOUNTER — Inpatient Hospital Stay (HOSPITAL_COMMUNITY): Payer: Medicare Other

## 2015-09-06 ENCOUNTER — Encounter (HOSPITAL_COMMUNITY)
Admission: RE | Disposition: A | Discharge status: Skilled Nursing Facility | Payer: Self-pay | Source: Ambulatory Visit | Attending: Neurosurgery

## 2015-09-06 ENCOUNTER — Inpatient Hospital Stay (HOSPITAL_COMMUNITY): Payer: Medicare Other | Admitting: Vascular Surgery

## 2015-09-06 ENCOUNTER — Inpatient Hospital Stay (HOSPITAL_COMMUNITY): Payer: Medicare Other | Admitting: Certified Registered"

## 2015-09-06 ENCOUNTER — Encounter (HOSPITAL_COMMUNITY): Payer: Self-pay | Admitting: *Deleted

## 2015-09-06 DIAGNOSIS — I6523 Occlusion and stenosis of bilateral carotid arteries: Secondary | ICD-10-CM | POA: Diagnosis present

## 2015-09-06 DIAGNOSIS — Z515 Encounter for palliative care: Secondary | ICD-10-CM | POA: Diagnosis not present

## 2015-09-06 DIAGNOSIS — T783XXA Angioneurotic edema, initial encounter: Secondary | ICD-10-CM

## 2015-09-06 DIAGNOSIS — G8190 Hemiplegia, unspecified affecting unspecified side: Secondary | ICD-10-CM | POA: Diagnosis not present

## 2015-09-06 DIAGNOSIS — D696 Thrombocytopenia, unspecified: Secondary | ICD-10-CM | POA: Diagnosis present

## 2015-09-06 DIAGNOSIS — R414 Neurologic neglect syndrome: Secondary | ICD-10-CM | POA: Diagnosis not present

## 2015-09-06 DIAGNOSIS — I6340 Cerebral infarction due to embolism of unspecified cerebral artery: Secondary | ICD-10-CM | POA: Diagnosis not present

## 2015-09-06 DIAGNOSIS — G936 Cerebral edema: Secondary | ICD-10-CM | POA: Diagnosis not present

## 2015-09-06 DIAGNOSIS — Z91048 Other nonmedicinal substance allergy status: Secondary | ICD-10-CM

## 2015-09-06 DIAGNOSIS — Z419 Encounter for procedure for purposes other than remedying health state, unspecified: Secondary | ICD-10-CM

## 2015-09-06 DIAGNOSIS — I9581 Postprocedural hypotension: Secondary | ICD-10-CM | POA: Diagnosis not present

## 2015-09-06 DIAGNOSIS — G9341 Metabolic encephalopathy: Secondary | ICD-10-CM | POA: Diagnosis not present

## 2015-09-06 DIAGNOSIS — I6522 Occlusion and stenosis of left carotid artery: Secondary | ICD-10-CM

## 2015-09-06 DIAGNOSIS — I481 Persistent atrial fibrillation: Secondary | ICD-10-CM | POA: Diagnosis not present

## 2015-09-06 DIAGNOSIS — R29898 Other symptoms and signs involving the musculoskeletal system: Secondary | ICD-10-CM

## 2015-09-06 DIAGNOSIS — R509 Fever, unspecified: Secondary | ICD-10-CM | POA: Diagnosis not present

## 2015-09-06 DIAGNOSIS — Z66 Do not resuscitate: Secondary | ICD-10-CM | POA: Diagnosis not present

## 2015-09-06 DIAGNOSIS — R29810 Facial weakness: Secondary | ICD-10-CM | POA: Diagnosis not present

## 2015-09-06 DIAGNOSIS — Y838 Other surgical procedures as the cause of abnormal reaction of the patient, or of later complication, without mention of misadventure at the time of the procedure: Secondary | ICD-10-CM | POA: Diagnosis not present

## 2015-09-06 DIAGNOSIS — I739 Peripheral vascular disease, unspecified: Secondary | ICD-10-CM | POA: Diagnosis not present

## 2015-09-06 DIAGNOSIS — I959 Hypotension, unspecified: Secondary | ICD-10-CM | POA: Diagnosis not present

## 2015-09-06 DIAGNOSIS — J9600 Acute respiratory failure, unspecified whether with hypoxia or hypercapnia: Secondary | ICD-10-CM | POA: Diagnosis not present

## 2015-09-06 DIAGNOSIS — Z7189 Other specified counseling: Secondary | ICD-10-CM | POA: Insufficient documentation

## 2015-09-06 DIAGNOSIS — E875 Hyperkalemia: Secondary | ICD-10-CM | POA: Diagnosis not present

## 2015-09-06 DIAGNOSIS — Z7982 Long term (current) use of aspirin: Secondary | ICD-10-CM | POA: Diagnosis not present

## 2015-09-06 DIAGNOSIS — Z7984 Long term (current) use of oral hypoglycemic drugs: Secondary | ICD-10-CM

## 2015-09-06 DIAGNOSIS — Z01818 Encounter for other preprocedural examination: Secondary | ICD-10-CM

## 2015-09-06 DIAGNOSIS — B9620 Unspecified Escherichia coli [E. coli] as the cause of diseases classified elsewhere: Secondary | ICD-10-CM | POA: Diagnosis not present

## 2015-09-06 DIAGNOSIS — E1151 Type 2 diabetes mellitus with diabetic peripheral angiopathy without gangrene: Secondary | ICD-10-CM | POA: Diagnosis present

## 2015-09-06 DIAGNOSIS — N390 Urinary tract infection, site not specified: Secondary | ICD-10-CM | POA: Diagnosis not present

## 2015-09-06 DIAGNOSIS — I6789 Other cerebrovascular disease: Secondary | ICD-10-CM | POA: Diagnosis not present

## 2015-09-06 DIAGNOSIS — M4802 Spinal stenosis, cervical region: Principal | ICD-10-CM | POA: Diagnosis present

## 2015-09-06 DIAGNOSIS — K219 Gastro-esophageal reflux disease without esophagitis: Secondary | ICD-10-CM | POA: Diagnosis present

## 2015-09-06 DIAGNOSIS — I119 Hypertensive heart disease without heart failure: Secondary | ICD-10-CM | POA: Diagnosis present

## 2015-09-06 DIAGNOSIS — B370 Candidal stomatitis: Secondary | ICD-10-CM | POA: Diagnosis not present

## 2015-09-06 DIAGNOSIS — J690 Pneumonitis due to inhalation of food and vomit: Secondary | ICD-10-CM | POA: Diagnosis not present

## 2015-09-06 DIAGNOSIS — I63511 Cerebral infarction due to unspecified occlusion or stenosis of right middle cerebral artery: Secondary | ICD-10-CM | POA: Diagnosis not present

## 2015-09-06 DIAGNOSIS — N990 Postprocedural (acute) (chronic) kidney failure: Secondary | ICD-10-CM | POA: Diagnosis not present

## 2015-09-06 DIAGNOSIS — I4891 Unspecified atrial fibrillation: Secondary | ICD-10-CM | POA: Diagnosis not present

## 2015-09-06 DIAGNOSIS — G8194 Hemiplegia, unspecified affecting left nondominant side: Secondary | ICD-10-CM | POA: Diagnosis not present

## 2015-09-06 DIAGNOSIS — Z880 Allergy status to penicillin: Secondary | ICD-10-CM

## 2015-09-06 DIAGNOSIS — T17908A Unspecified foreign body in respiratory tract, part unspecified causing other injury, initial encounter: Secondary | ICD-10-CM

## 2015-09-06 DIAGNOSIS — F17200 Nicotine dependence, unspecified, uncomplicated: Secondary | ICD-10-CM | POA: Insufficient documentation

## 2015-09-06 DIAGNOSIS — R262 Difficulty in walking, not elsewhere classified: Secondary | ICD-10-CM | POA: Diagnosis present

## 2015-09-06 DIAGNOSIS — I639 Cerebral infarction, unspecified: Secondary | ICD-10-CM

## 2015-09-06 DIAGNOSIS — I69322 Dysarthria following cerebral infarction: Secondary | ICD-10-CM | POA: Diagnosis not present

## 2015-09-06 DIAGNOSIS — I6529 Occlusion and stenosis of unspecified carotid artery: Secondary | ICD-10-CM | POA: Insufficient documentation

## 2015-09-06 DIAGNOSIS — I480 Paroxysmal atrial fibrillation: Secondary | ICD-10-CM

## 2015-09-06 DIAGNOSIS — J189 Pneumonia, unspecified organism: Secondary | ICD-10-CM | POA: Diagnosis not present

## 2015-09-06 DIAGNOSIS — E785 Hyperlipidemia, unspecified: Secondary | ICD-10-CM | POA: Diagnosis present

## 2015-09-06 DIAGNOSIS — I63411 Cerebral infarction due to embolism of right middle cerebral artery: Secondary | ICD-10-CM | POA: Diagnosis not present

## 2015-09-06 DIAGNOSIS — H510 Palsy (spasm) of conjugate gaze: Secondary | ICD-10-CM | POA: Diagnosis not present

## 2015-09-06 DIAGNOSIS — E876 Hypokalemia: Secondary | ICD-10-CM

## 2015-09-06 DIAGNOSIS — J9601 Acute respiratory failure with hypoxia: Secondary | ICD-10-CM

## 2015-09-06 DIAGNOSIS — Z79899 Other long term (current) drug therapy: Secondary | ICD-10-CM

## 2015-09-06 DIAGNOSIS — Z79891 Long term (current) use of opiate analgesic: Secondary | ICD-10-CM | POA: Diagnosis not present

## 2015-09-06 DIAGNOSIS — Z4659 Encounter for fitting and adjustment of other gastrointestinal appliance and device: Secondary | ICD-10-CM

## 2015-09-06 DIAGNOSIS — Z931 Gastrostomy status: Secondary | ICD-10-CM

## 2015-09-06 DIAGNOSIS — M4806 Spinal stenosis, lumbar region: Secondary | ICD-10-CM | POA: Diagnosis present

## 2015-09-06 DIAGNOSIS — R1313 Dysphagia, pharyngeal phase: Secondary | ICD-10-CM | POA: Diagnosis not present

## 2015-09-06 DIAGNOSIS — J9690 Respiratory failure, unspecified, unspecified whether with hypoxia or hypercapnia: Secondary | ICD-10-CM

## 2015-09-06 DIAGNOSIS — M48062 Spinal stenosis, lumbar region with neurogenic claudication: Secondary | ICD-10-CM | POA: Diagnosis present

## 2015-09-06 DIAGNOSIS — R1310 Dysphagia, unspecified: Secondary | ICD-10-CM | POA: Insufficient documentation

## 2015-09-06 HISTORY — PX: ANTERIOR CERVICAL DECOMP/DISCECTOMY FUSION: SHX1161

## 2015-09-06 HISTORY — PX: LUMBAR LAMINECTOMY/DECOMPRESSION MICRODISCECTOMY: SHX5026

## 2015-09-06 LAB — CBC
HCT: 42.6 % (ref 39.0–52.0)
Hemoglobin: 14.6 g/dL (ref 13.0–17.0)
MCH: 30.6 pg (ref 26.0–34.0)
MCHC: 34.3 g/dL (ref 30.0–36.0)
MCV: 89.3 fL (ref 78.0–100.0)
PLATELETS: 96 10*3/uL — AB (ref 150–400)
RBC: 4.77 MIL/uL (ref 4.22–5.81)
RDW: 15 % (ref 11.5–15.5)
WBC: 12.5 10*3/uL — AB (ref 4.0–10.5)

## 2015-09-06 LAB — COMPREHENSIVE METABOLIC PANEL
ALT: 12 U/L — AB (ref 17–63)
AST: 22 U/L (ref 15–41)
Albumin: 2.8 g/dL — ABNORMAL LOW (ref 3.5–5.0)
Alkaline Phosphatase: 73 U/L (ref 38–126)
Anion gap: 8 (ref 5–15)
BUN: 11 mg/dL (ref 6–20)
CHLORIDE: 105 mmol/L (ref 101–111)
CO2: 25 mmol/L (ref 22–32)
CREATININE: 1.04 mg/dL (ref 0.61–1.24)
Calcium: 8.6 mg/dL — ABNORMAL LOW (ref 8.9–10.3)
Glucose, Bld: 205 mg/dL — ABNORMAL HIGH (ref 65–99)
POTASSIUM: 4 mmol/L (ref 3.5–5.1)
Sodium: 138 mmol/L (ref 135–145)
TOTAL PROTEIN: 5.4 g/dL — AB (ref 6.5–8.1)
Total Bilirubin: 1.3 mg/dL — ABNORMAL HIGH (ref 0.3–1.2)

## 2015-09-06 LAB — BLOOD GAS, ARTERIAL
ACID-BASE EXCESS: 0.4 mmol/L (ref 0.0–2.0)
BICARBONATE: 24.7 meq/L — AB (ref 20.0–24.0)
Drawn by: 445891
FIO2: 0.3
O2 SAT: 98.1 %
PATIENT TEMPERATURE: 97.2
PO2 ART: 103 mmHg — AB (ref 80.0–100.0)
RATE: 16 resp/min
TCO2: 26 mmol/L (ref 0–100)
pCO2 arterial: 40.1 mmHg (ref 35.0–45.0)
pH, Arterial: 7.402 (ref 7.350–7.450)

## 2015-09-06 LAB — GLUCOSE, CAPILLARY
GLUCOSE-CAPILLARY: 122 mg/dL — AB (ref 65–99)
GLUCOSE-CAPILLARY: 161 mg/dL — AB (ref 65–99)
Glucose-Capillary: 188 mg/dL — ABNORMAL HIGH (ref 65–99)

## 2015-09-06 LAB — LACTIC ACID, PLASMA: Lactic Acid, Venous: 2.5 mmol/L (ref 0.5–2.0)

## 2015-09-06 LAB — TROPONIN I

## 2015-09-06 SURGERY — ANTERIOR CERVICAL DECOMPRESSION/DISCECTOMY FUSION 2 LEVELS
Anesthesia: General | Site: Back

## 2015-09-06 MED ORDER — PROPRANOLOL HCL ER 80 MG PO CP24
80.0000 mg | ORAL_CAPSULE | Freq: Every day | ORAL | Status: DC
  Filled 2015-09-06: qty 1

## 2015-09-06 MED ORDER — OXYCODONE-ACETAMINOPHEN 5-325 MG PO TABS: 1.0000 | ORAL_TABLET | ORAL | Status: DC | PRN

## 2015-09-06 MED ORDER — ASPIRIN 81 MG PO CHEW
81.0000 mg | CHEWABLE_TABLET | Freq: Every day | ORAL | Status: DC
  Administered 2015-09-06: 81 mg
  Filled 2015-09-06: qty 1

## 2015-09-06 MED ORDER — ROSUVASTATIN CALCIUM 10 MG PO TABS
10.0000 mg | ORAL_TABLET | Freq: Every day | ORAL | Status: DC
  Administered 2015-09-07 – 2015-09-27 (×17): 10 mg via ORAL
  Filled 2015-09-06 (×22): qty 1

## 2015-09-06 MED ORDER — HEMOSTATIC AGENTS (NO CHARGE) OPTIME
TOPICAL | Status: DC | PRN
  Administered 2015-09-06: 1 via TOPICAL

## 2015-09-06 MED ORDER — DEXAMETHASONE SODIUM PHOSPHATE 4 MG/ML IJ SOLN
4.0000 mg | Freq: Four times a day (QID) | INTRAMUSCULAR | Status: DC
  Administered 2015-09-06 – 2015-09-09 (×11): 4 mg via INTRAVENOUS
  Filled 2015-09-06 (×11): qty 1

## 2015-09-06 MED ORDER — FOLIC ACID 1 MG PO TABS
1.0000 mg | ORAL_TABLET | Freq: Every day | ORAL | Status: DC
  Administered 2015-09-07 – 2015-09-27 (×17): 1 mg via ORAL
  Filled 2015-09-06 (×19): qty 1

## 2015-09-06 MED ORDER — PHENYLEPHRINE 40 MCG/ML (10ML) SYRINGE FOR IV PUSH (FOR BLOOD PRESSURE SUPPORT)
PREFILLED_SYRINGE | INTRAVENOUS | Status: AC
  Filled 2015-09-06: qty 10

## 2015-09-06 MED ORDER — EPHEDRINE SULFATE 50 MG/ML IJ SOLN
INTRAMUSCULAR | Status: AC
  Filled 2015-09-06: qty 1

## 2015-09-06 MED ORDER — PHENYLEPHRINE HCL 10 MG/ML IJ SOLN: 0.0000 ug/min | INTRAVENOUS | Status: DC

## 2015-09-06 MED ORDER — SODIUM CHLORIDE 0.9 % IV BOLUS (SEPSIS): 500.0000 mL | Freq: Once | INTRAVENOUS | Status: AC

## 2015-09-06 MED ORDER — BUPIVACAINE HCL (PF) 0.25 % IJ SOLN
INTRAMUSCULAR | Status: DC | PRN
  Administered 2015-09-06: 20 mL

## 2015-09-06 MED ORDER — HYDROMORPHONE HCL 1 MG/ML IJ SOLN
0.2500 mg | INTRAMUSCULAR | Status: DC | PRN
  Administered 2015-09-06: 0.5 mg via INTRAVENOUS

## 2015-09-06 MED ORDER — SODIUM CHLORIDE 0.9 % IJ SOLN
3.0000 mL | Freq: Two times a day (BID) | INTRAMUSCULAR | Status: DC
  Administered 2015-09-06 – 2015-09-08 (×3): 3 mL via INTRAVENOUS
  Administered 2015-09-08: 10 mL via INTRAVENOUS
  Administered 2015-09-09 – 2015-09-11 (×6): 3 mL via INTRAVENOUS

## 2015-09-06 MED ORDER — IRBESARTAN 300 MG PO TABS: 300.0000 mg | ORAL_TABLET | Freq: Every day | ORAL | Status: DC

## 2015-09-06 MED ORDER — METFORMIN HCL 500 MG PO TABS: 500.0000 mg | ORAL_TABLET | Freq: Two times a day (BID) | ORAL | Status: DC

## 2015-09-06 MED ORDER — ALBUMIN HUMAN 5 % IV SOLN
INTRAVENOUS | Status: AC
  Administered 2015-09-06: 12.5 g
  Filled 2015-09-06: qty 250

## 2015-09-06 MED ORDER — ASPIRIN EC 81 MG PO TBEC: 81.0000 mg | DELAYED_RELEASE_TABLET | Freq: Every day | ORAL | Status: DC

## 2015-09-06 MED ORDER — STERILE WATER FOR INJECTION IJ SOLN
INTRAMUSCULAR | Status: AC
  Filled 2015-09-06: qty 10

## 2015-09-06 MED ORDER — PANTOPRAZOLE SODIUM 40 MG IV SOLR
40.0000 mg | Freq: Every day | INTRAVENOUS | Status: DC
  Administered 2015-09-06 – 2015-09-26 (×21): 40 mg via INTRAVENOUS
  Filled 2015-09-06 (×21): qty 40

## 2015-09-06 MED ORDER — PROPOFOL 10 MG/ML IV BOLUS
INTRAVENOUS | Status: AC
  Filled 2015-09-06: qty 20

## 2015-09-06 MED ORDER — ATROPINE SULFATE 0.1 MG/ML IJ SOLN
INTRAMUSCULAR | Status: AC
  Filled 2015-09-06: qty 10

## 2015-09-06 MED ORDER — ACETAMINOPHEN 325 MG PO TABS
650.0000 mg | ORAL_TABLET | ORAL | Status: DC | PRN
  Administered 2015-09-07 – 2015-09-26 (×11): 650 mg via ORAL
  Filled 2015-09-06 (×11): qty 2

## 2015-09-06 MED ORDER — ONDANSETRON HCL 4 MG/2ML IJ SOLN
INTRAMUSCULAR | Status: DC | PRN
  Administered 2015-09-06: 4 mg via INTRAVENOUS

## 2015-09-06 MED ORDER — PROPOFOL 10 MG/ML IV BOLUS
INTRAVENOUS | Status: DC | PRN
  Administered 2015-09-06: 100 mg via INTRAVENOUS

## 2015-09-06 MED ORDER — DEXMEDETOMIDINE HCL IN NACL 400 MCG/100ML IV SOLN
INTRAVENOUS | Status: DC | PRN
  Administered 2015-09-06: .4 ug/kg/h via INTRAVENOUS

## 2015-09-06 MED ORDER — MENTHOL 3 MG MT LOZG: 1.0000 | LOZENGE | OROMUCOSAL | Status: DC | PRN

## 2015-09-06 MED ORDER — VECURONIUM BROMIDE 10 MG IV SOLR
INTRAVENOUS | Status: DC | PRN
  Administered 2015-09-06 (×3): 2 mg via INTRAVENOUS

## 2015-09-06 MED ORDER — HYDROCODONE-ACETAMINOPHEN 5-325 MG PO TABS: 1.0000 | ORAL_TABLET | ORAL | Status: DC | PRN

## 2015-09-06 MED ORDER — PHENYLEPHRINE HCL 10 MG/ML IJ SOLN
INTRAMUSCULAR | Status: DC | PRN
  Administered 2015-09-06 (×2): 80 ug via INTRAVENOUS
  Administered 2015-09-06: 40 ug via INTRAVENOUS
  Administered 2015-09-06: 120 ug via INTRAVENOUS
  Administered 2015-09-06 (×7): 80 ug via INTRAVENOUS

## 2015-09-06 MED ORDER — HYDROMORPHONE HCL 1 MG/ML IJ SOLN: 0.5000 mg | INTRAMUSCULAR | Status: DC | PRN

## 2015-09-06 MED ORDER — 0.9 % SODIUM CHLORIDE (POUR BTL) OPTIME
TOPICAL | Status: DC | PRN
  Administered 2015-09-06: 1000 mL

## 2015-09-06 MED ORDER — LIDOCAINE HCL (CARDIAC) 20 MG/ML IV SOLN
INTRAVENOUS | Status: AC
  Filled 2015-09-06: qty 5

## 2015-09-06 MED ORDER — SODIUM CHLORIDE 0.9 % IV BOLUS (SEPSIS)
500.0000 mL | Freq: Once | INTRAVENOUS | Status: AC
  Administered 2015-09-06: 500 mL via INTRAVENOUS

## 2015-09-06 MED ORDER — SUCCINYLCHOLINE CHLORIDE 20 MG/ML IJ SOLN
INTRAMUSCULAR | Status: AC
  Filled 2015-09-06: qty 1

## 2015-09-06 MED ORDER — CYCLOBENZAPRINE HCL 10 MG PO TABS
10.0000 mg | ORAL_TABLET | Freq: Three times a day (TID) | ORAL | Status: DC | PRN
  Administered 2015-09-13: 10 mg via ORAL
  Filled 2015-09-06: qty 1

## 2015-09-06 MED ORDER — VANCOMYCIN HCL IN DEXTROSE 1-5 GM/200ML-% IV SOLN
1000.0000 mg | INTRAVENOUS | Status: AC
  Administered 2015-09-06: 1000 mg via INTRAVENOUS
  Filled 2015-09-06: qty 200

## 2015-09-06 MED ORDER — CHLORHEXIDINE GLUCONATE 0.12% ORAL RINSE (MEDLINE KIT)
15.0000 mL | Freq: Two times a day (BID) | OROMUCOSAL | Status: DC
  Administered 2015-09-06 – 2015-09-08 (×4): 15 mL via OROMUCOSAL

## 2015-09-06 MED ORDER — FAMOTIDINE IN NACL 20-0.9 MG/50ML-% IV SOLN
20.0000 mg | INTRAVENOUS | Status: DC
  Administered 2015-09-06 – 2015-09-08 (×3): 20 mg via INTRAVENOUS
  Filled 2015-09-06 (×3): qty 50

## 2015-09-06 MED ORDER — FENTANYL CITRATE (PF) 100 MCG/2ML IJ SOLN
50.0000 ug | INTRAMUSCULAR | Status: DC | PRN
  Administered 2015-09-07 – 2015-09-12 (×11): 50 ug via INTRAVENOUS
  Filled 2015-09-06 (×9): qty 2

## 2015-09-06 MED ORDER — ROCURONIUM BROMIDE 100 MG/10ML IV SOLN
INTRAVENOUS | Status: DC | PRN
  Administered 2015-09-06: 10 mg via INTRAVENOUS
  Administered 2015-09-06: 40 mg via INTRAVENOUS

## 2015-09-06 MED ORDER — DIPHENHYDRAMINE HCL 50 MG/ML IJ SOLN
25.0000 mg | Freq: Four times a day (QID) | INTRAMUSCULAR | Status: DC
  Administered 2015-09-06 – 2015-09-09 (×11): 25 mg via INTRAVENOUS
  Filled 2015-09-06 (×11): qty 1

## 2015-09-06 MED ORDER — ALBUMIN HUMAN 5 % IV SOLN: 12.5000 g | Freq: Once | INTRAVENOUS | Status: DC

## 2015-09-06 MED ORDER — GLYCOPYRROLATE 0.2 MG/ML IJ SOLN
INTRAMUSCULAR | Status: AC
  Filled 2015-09-06: qty 1

## 2015-09-06 MED ORDER — SODIUM CHLORIDE 0.9 % IV SOLN: 250.0000 mL | INTRAVENOUS | Status: DC

## 2015-09-06 MED ORDER — NEOSTIGMINE METHYLSULFATE 10 MG/10ML IV SOLN
INTRAVENOUS | Status: DC | PRN
  Administered 2015-09-06: 4 mg via INTRAVENOUS

## 2015-09-06 MED ORDER — VECURONIUM BROMIDE 10 MG IV SOLR
INTRAVENOUS | Status: AC
  Filled 2015-09-06: qty 10

## 2015-09-06 MED ORDER — EPHEDRINE SULFATE 50 MG/ML IJ SOLN
INTRAMUSCULAR | Status: DC | PRN
  Administered 2015-09-06: 10 mg via INTRAVENOUS
  Administered 2015-09-06: 5 mg via INTRAVENOUS
  Administered 2015-09-06: 10 mg via INTRAVENOUS
  Administered 2015-09-06: 5 mg via INTRAVENOUS
  Administered 2015-09-06 (×2): 10 mg via INTRAVENOUS

## 2015-09-06 MED ORDER — ANTISEPTIC ORAL RINSE SOLUTION (CORINZ)
7.0000 mL | Freq: Four times a day (QID) | OROMUCOSAL | Status: DC
  Administered 2015-09-06 – 2015-09-08 (×8): 7 mL via OROMUCOSAL

## 2015-09-06 MED ORDER — ALBUMIN HUMAN 5 % IV SOLN
12.5000 g | Freq: Once | INTRAVENOUS | Status: AC
  Administered 2015-09-06: 12.5 g via INTRAVENOUS
  Filled 2015-09-06: qty 250

## 2015-09-06 MED ORDER — GLYCOPYRROLATE 0.2 MG/ML IJ SOLN
INTRAMUSCULAR | Status: AC
  Filled 2015-09-06: qty 2

## 2015-09-06 MED ORDER — FENTANYL CITRATE (PF) 100 MCG/2ML IJ SOLN
50.0000 ug | INTRAMUSCULAR | Status: DC | PRN
  Administered 2015-09-07: 50 ug via INTRAVENOUS
  Filled 2015-09-06 (×3): qty 2

## 2015-09-06 MED ORDER — DEXAMETHASONE SODIUM PHOSPHATE 10 MG/ML IJ SOLN
10.0000 mg | INTRAMUSCULAR | Status: AC
Start: 2015-09-06 — End: 2015-09-06
  Administered 2015-09-06 (×2): 10 mg via INTRAVENOUS
  Filled 2015-09-06: qty 1

## 2015-09-06 MED ORDER — DEXAMETHASONE 4 MG PO TABS: 4.0000 mg | ORAL_TABLET | Freq: Four times a day (QID) | ORAL | Status: DC

## 2015-09-06 MED ORDER — PHENOL 1.4 % MT LIQD: 1.0000 | OROMUCOSAL | Status: DC | PRN

## 2015-09-06 MED ORDER — ONDANSETRON HCL 4 MG/2ML IJ SOLN: 4.0000 mg | INTRAMUSCULAR | Status: DC | PRN

## 2015-09-06 MED ORDER — ACETAMINOPHEN 650 MG RE SUPP
650.0000 mg | RECTAL | Status: DC | PRN
  Administered 2015-09-09 – 2015-09-13 (×4): 650 mg via RECTAL
  Filled 2015-09-06 (×4): qty 1

## 2015-09-06 MED ORDER — DEXMEDETOMIDINE HCL IN NACL 200 MCG/50ML IV SOLN
0.0000 ug/kg/h | INTRAVENOUS | Status: DC
  Administered 2015-09-06: 0.7 ug/kg/h via INTRAVENOUS

## 2015-09-06 MED ORDER — SODIUM CHLORIDE 0.9 % IJ SOLN: 3.0000 mL | INTRAMUSCULAR | Status: DC | PRN

## 2015-09-06 MED ORDER — HYDROCHLOROTHIAZIDE 25 MG PO TABS: 25.0000 mg | ORAL_TABLET | Freq: Every day | ORAL | Status: DC | PRN

## 2015-09-06 MED ORDER — SODIUM CHLORIDE 0.9 % IV BOLUS (SEPSIS)
1000.0000 mL | Freq: Once | INTRAVENOUS | Status: AC
  Administered 2015-09-06: 1000 mL via INTRAVENOUS

## 2015-09-06 MED ORDER — DEXMEDETOMIDINE HCL IN NACL 200 MCG/50ML IV SOLN
INTRAVENOUS | Status: AC
  Filled 2015-09-06: qty 50

## 2015-09-06 MED ORDER — SODIUM CHLORIDE 0.9 % IV SOLN
INTRAVENOUS | Status: DC
  Administered 2015-09-06 – 2015-09-11 (×8): via INTRAVENOUS
  Administered 2015-09-12: 75 mL/h via INTRAVENOUS
  Administered 2015-09-13: 1000 mL via INTRAVENOUS

## 2015-09-06 MED ORDER — ROCURONIUM BROMIDE 50 MG/5ML IV SOLN
INTRAVENOUS | Status: AC
  Filled 2015-09-06: qty 1

## 2015-09-06 MED ORDER — PROMETHAZINE HCL 25 MG/ML IJ SOLN: 6.2500 mg | INTRAMUSCULAR | Status: DC | PRN

## 2015-09-06 MED ORDER — VANCOMYCIN HCL IN DEXTROSE 1-5 GM/200ML-% IV SOLN
1000.0000 mg | Freq: Two times a day (BID) | INTRAVENOUS | Status: DC
  Administered 2015-09-06 – 2015-09-09 (×7): 1000 mg via INTRAVENOUS
  Filled 2015-09-06 (×8): qty 200

## 2015-09-06 MED ORDER — MIDAZOLAM HCL 2 MG/2ML IJ SOLN
1.0000 mg | INTRAMUSCULAR | Status: DC | PRN
  Administered 2015-09-07 – 2015-09-08 (×3): 1 mg via INTRAVENOUS
  Filled 2015-09-06 (×3): qty 2

## 2015-09-06 MED ORDER — FENTANYL CITRATE (PF) 250 MCG/5ML IJ SOLN
INTRAMUSCULAR | Status: AC
  Filled 2015-09-06: qty 5

## 2015-09-06 MED ORDER — MIDAZOLAM HCL 2 MG/2ML IJ SOLN
1.0000 mg | INTRAMUSCULAR | Status: DC | PRN
Start: 2015-09-06 — End: 2015-09-27

## 2015-09-06 MED ORDER — PHENYLEPHRINE HCL 10 MG/ML IJ SOLN
10.0000 mg | INTRAMUSCULAR | Status: DC | PRN
  Administered 2015-09-06: 30 ug/min via INTRAVENOUS

## 2015-09-06 MED ORDER — THROMBIN 20000 UNITS EX SOLR
CUTANEOUS | Status: DC | PRN
  Administered 2015-09-06: 07:00:00 via TOPICAL

## 2015-09-06 MED ORDER — DIPHENHYDRAMINE HCL 50 MG/ML IJ SOLN
INTRAMUSCULAR | Status: AC
  Filled 2015-09-06: qty 1

## 2015-09-06 MED ORDER — HYDROMORPHONE HCL 1 MG/ML IJ SOLN
INTRAMUSCULAR | Status: AC
  Filled 2015-09-06: qty 1

## 2015-09-06 MED ORDER — GLYCOPYRROLATE 0.2 MG/ML IJ SOLN
INTRAMUSCULAR | Status: DC | PRN
  Administered 2015-09-06 (×2): 0.2 mg via INTRAVENOUS
  Administered 2015-09-06: 0.6 mg via INTRAVENOUS
  Administered 2015-09-06: 0.2 mg via INTRAVENOUS

## 2015-09-06 MED ORDER — DIPHENHYDRAMINE HCL 50 MG/ML IJ SOLN
INTRAMUSCULAR | Status: DC | PRN
Start: 2015-09-06 — End: 2015-09-06
  Administered 2015-09-06: 25 mg via INTRAVENOUS

## 2015-09-06 MED ORDER — DEXAMETHASONE SODIUM PHOSPHATE 10 MG/ML IJ SOLN
INTRAMUSCULAR | Status: AC
  Filled 2015-09-06: qty 1

## 2015-09-06 MED ORDER — LIDOCAINE HCL (CARDIAC) 20 MG/ML IV SOLN
INTRAVENOUS | Status: DC | PRN
  Administered 2015-09-06: 100 mg via INTRAVENOUS

## 2015-09-06 MED ORDER — THROMBIN 5000 UNITS EX SOLR
CUTANEOUS | Status: DC | PRN
  Administered 2015-09-06 (×2): 5000 [IU] via TOPICAL

## 2015-09-06 MED ORDER — PHENYLEPHRINE HCL 10 MG/ML IJ SOLN
30.0000 ug/min | INTRAMUSCULAR | Status: DC
  Filled 2015-09-06 (×2): qty 1

## 2015-09-06 MED ORDER — LACTATED RINGERS IV SOLN
INTRAVENOUS | Status: DC | PRN
  Administered 2015-09-06 (×3): via INTRAVENOUS

## 2015-09-06 MED ORDER — SODIUM CHLORIDE 0.9 % IR SOLN
Status: DC | PRN
  Administered 2015-09-06 (×2)

## 2015-09-06 MED ORDER — SODIUM CHLORIDE 0.9 % IJ SOLN
INTRAMUSCULAR | Status: AC
  Filled 2015-09-06: qty 10

## 2015-09-06 MED ORDER — FENTANYL CITRATE (PF) 250 MCG/5ML IJ SOLN
INTRAMUSCULAR | Status: DC | PRN
Start: 2015-09-06 — End: 2015-09-06
  Administered 2015-09-06 (×2): 25 ug via INTRAVENOUS
  Administered 2015-09-06: 100 ug via INTRAVENOUS

## 2015-09-06 MED ORDER — INSULIN ASPART 100 UNIT/ML ~~LOC~~ SOLN
0.0000 [IU] | SUBCUTANEOUS | Status: DC
  Administered 2015-09-06 (×2): 3 [IU] via SUBCUTANEOUS
  Administered 2015-09-07 (×3): 2 [IU] via SUBCUTANEOUS
  Administered 2015-09-07: 3 [IU] via SUBCUTANEOUS
  Administered 2015-09-07 – 2015-09-08 (×3): 2 [IU] via SUBCUTANEOUS
  Administered 2015-09-08: 3 [IU] via SUBCUTANEOUS
  Administered 2015-09-08 – 2015-09-10 (×6): 2 [IU] via SUBCUTANEOUS
  Administered 2015-09-11 (×4): 3 [IU] via SUBCUTANEOUS
  Administered 2015-09-11: 2 [IU] via SUBCUTANEOUS
  Administered 2015-09-11: 3 [IU] via SUBCUTANEOUS
  Administered 2015-09-12 – 2015-09-13 (×3): 2 [IU] via SUBCUTANEOUS
  Administered 2015-09-14: 3 [IU] via SUBCUTANEOUS
  Administered 2015-09-14: 2 [IU] via SUBCUTANEOUS
  Administered 2015-09-14 (×2): 3 [IU] via SUBCUTANEOUS
  Administered 2015-09-15 – 2015-09-16 (×3): 2 [IU] via SUBCUTANEOUS
  Administered 2015-09-16: 3 [IU] via SUBCUTANEOUS
  Administered 2015-09-16: 2 [IU] via SUBCUTANEOUS
  Administered 2015-09-17: 3 [IU] via SUBCUTANEOUS
  Administered 2015-09-17 (×4): 2 [IU] via SUBCUTANEOUS
  Administered 2015-09-18: 3 [IU] via SUBCUTANEOUS
  Administered 2015-09-18 (×3): 2 [IU] via SUBCUTANEOUS
  Administered 2015-09-18: 3 [IU] via SUBCUTANEOUS
  Administered 2015-09-19: 2 [IU] via SUBCUTANEOUS
  Administered 2015-09-19 (×2): 3 [IU] via SUBCUTANEOUS
  Administered 2015-09-19: 2 [IU] via SUBCUTANEOUS
  Administered 2015-09-19: 3 [IU] via SUBCUTANEOUS
  Administered 2015-09-19: 2 [IU] via SUBCUTANEOUS
  Administered 2015-09-20: 3 [IU] via SUBCUTANEOUS
  Administered 2015-09-20 – 2015-09-21 (×6): 2 [IU] via SUBCUTANEOUS
  Administered 2015-09-21 (×2): 3 [IU] via SUBCUTANEOUS
  Administered 2015-09-21: 1 [IU] via SUBCUTANEOUS
  Administered 2015-09-22: 2 [IU] via SUBCUTANEOUS
  Administered 2015-09-22 (×2): 3 [IU] via SUBCUTANEOUS
  Administered 2015-09-22: 2 [IU] via SUBCUTANEOUS
  Administered 2015-09-22 – 2015-09-23 (×4): 3 [IU] via SUBCUTANEOUS
  Administered 2015-09-23: 2 [IU] via SUBCUTANEOUS
  Administered 2015-09-23 (×2): 3 [IU] via SUBCUTANEOUS
  Administered 2015-09-24: 2 [IU] via SUBCUTANEOUS
  Administered 2015-09-24: 5 [IU] via SUBCUTANEOUS
  Administered 2015-09-24 – 2015-09-26 (×6): 3 [IU] via SUBCUTANEOUS
  Administered 2015-09-26: 2 [IU] via SUBCUTANEOUS
  Administered 2015-09-26: 3 [IU] via SUBCUTANEOUS
  Administered 2015-09-26: 5 [IU] via SUBCUTANEOUS
  Administered 2015-09-26 – 2015-09-27 (×3): 3 [IU] via SUBCUTANEOUS
  Administered 2015-09-27: 5 [IU] via SUBCUTANEOUS

## 2015-09-06 SURGICAL SUPPLY — 73 items
APL SKNCLS STERI-STRIP NONHPOA (GAUZE/BANDAGES/DRESSINGS) ×4
BAG DECANTER FOR FLEXI CONT (MISCELLANEOUS) ×8 IMPLANT
BENZOIN TINCTURE PRP APPL 2/3 (GAUZE/BANDAGES/DRESSINGS) ×8 IMPLANT
BIT DRILL 13 (BIT) ×1 IMPLANT
BIT DRILL 13MM (BIT) ×1
BLADE CLIPPER SURG (BLADE) IMPLANT
BRUSH SCRUB EZ PLAIN DRY (MISCELLANEOUS) ×8 IMPLANT
BUR CUTTER 7.0 ROUND (BURR) ×6 IMPLANT
BUR MATCHSTICK NEURO 3.0 LAGG (BURR) ×4 IMPLANT
CAGE PEEK 7X14X11 (Cage) ×4 IMPLANT
CANISTER SUCT 3000ML PPV (MISCELLANEOUS) ×8 IMPLANT
CLOSURE WOUND 1/2 X4 (GAUZE/BANDAGES/DRESSINGS) ×2
DECANTER SPIKE VIAL GLASS SM (MISCELLANEOUS) ×4 IMPLANT
DRAPE C-ARM 42X72 X-RAY (DRAPES) ×8 IMPLANT
DRAPE LAPAROTOMY 100X72 PEDS (DRAPES) ×4 IMPLANT
DRAPE LAPAROTOMY 100X72X124 (DRAPES) ×4 IMPLANT
DRAPE MICROSCOPE LEICA (MISCELLANEOUS) ×8 IMPLANT
DRAPE POUCH INSTRU U-SHP 10X18 (DRAPES) ×8 IMPLANT
DRAPE PROXIMA HALF (DRAPES) ×2 IMPLANT
DRAPE SURG 17X23 STRL (DRAPES) ×8 IMPLANT
DRSG OPSITE POSTOP 3X4 (GAUZE/BANDAGES/DRESSINGS) ×2 IMPLANT
DURAPREP 26ML APPLICATOR (WOUND CARE) ×6 IMPLANT
DURAPREP 6ML APPLICATOR 50/CS (WOUND CARE) ×4 IMPLANT
ELECT COATED BLADE 2.86 ST (ELECTRODE) ×4 IMPLANT
ELECT REM PT RETURN 9FT ADLT (ELECTROSURGICAL) ×8
ELECTRODE REM PT RTRN 9FT ADLT (ELECTROSURGICAL) ×4 IMPLANT
GAUZE SPONGE 4X4 12PLY STRL (GAUZE/BANDAGES/DRESSINGS) ×6 IMPLANT
GAUZE SPONGE 4X4 16PLY XRAY LF (GAUZE/BANDAGES/DRESSINGS) IMPLANT
GLOVE ECLIPSE 9.0 STRL (GLOVE) ×8 IMPLANT
GLOVE EXAM NITRILE LRG STRL (GLOVE) IMPLANT
GLOVE EXAM NITRILE MD LF STRL (GLOVE) IMPLANT
GLOVE EXAM NITRILE XL STR (GLOVE) IMPLANT
GLOVE EXAM NITRILE XS STR PU (GLOVE) IMPLANT
GLOVE INDICATOR 7.0 STRL GRN (GLOVE) ×8 IMPLANT
GOWN STRL REUS W/ TWL LRG LVL3 (GOWN DISPOSABLE) IMPLANT
GOWN STRL REUS W/ TWL XL LVL3 (GOWN DISPOSABLE) ×4 IMPLANT
GOWN STRL REUS W/TWL 2XL LVL3 (GOWN DISPOSABLE) IMPLANT
GOWN STRL REUS W/TWL LRG LVL3 (GOWN DISPOSABLE) ×8
GOWN STRL REUS W/TWL XL LVL3 (GOWN DISPOSABLE) ×8
HALTER HD/CHIN CERV TRACTION D (MISCELLANEOUS) ×4 IMPLANT
HEMOSTAT SURGICEL 2X14 (HEMOSTASIS) IMPLANT
KIT BASIN OR (CUSTOM PROCEDURE TRAY) ×8 IMPLANT
KIT ROOM TURNOVER OR (KITS) ×8 IMPLANT
LIQUID BAND (GAUZE/BANDAGES/DRESSINGS) ×4 IMPLANT
NDL SPNL 20GX3.5 QUINCKE YW (NEEDLE) ×2 IMPLANT
NDL SPNL 22GX3.5 QUINCKE BK (NEEDLE) ×2 IMPLANT
NEEDLE HYPO 22GX1.5 SAFETY (NEEDLE) ×4 IMPLANT
NEEDLE SPNL 20GX3.5 QUINCKE YW (NEEDLE) ×8 IMPLANT
NEEDLE SPNL 22GX3.5 QUINCKE BK (NEEDLE) ×4 IMPLANT
NS IRRIG 1000ML POUR BTL (IV SOLUTION) ×6 IMPLANT
PACK LAMINECTOMY NEURO (CUSTOM PROCEDURE TRAY) ×8 IMPLANT
PAD ARMBOARD 7.5X6 YLW CONV (MISCELLANEOUS) ×24 IMPLANT
PEEK CAGE 8X14X11 (Peek) ×2 IMPLANT
PLATE ELITE VISION 25MM (Plate) ×4 IMPLANT
RUBBERBAND STERILE (MISCELLANEOUS) ×16 IMPLANT
SCREW ST 13X4XST VA NS SPNE (Screw) IMPLANT
SCREW ST 15X4XST VA NS SPNE (Screw) IMPLANT
SCREW ST VAR 4 ATL (Screw) ×32 IMPLANT
SPACER SPNL 11X14X7XPEEK CVD (Cage) IMPLANT
SPCR SPNL 11X14X7XPEEK CVD (Cage) ×2 IMPLANT
SPONGE INTESTINAL PEANUT (DISPOSABLE) ×4 IMPLANT
SPONGE SURGIFOAM ABS GEL SZ50 (HEMOSTASIS) ×8 IMPLANT
STRIP CLOSURE SKIN 1/2X4 (GAUZE/BANDAGES/DRESSINGS) ×6 IMPLANT
SUT VIC AB 2-0 CT1 18 (SUTURE) ×6 IMPLANT
SUT VIC AB 3-0 SH 8-18 (SUTURE) ×8 IMPLANT
SUT VIC AB 4-0 RB1 18 (SUTURE) ×4 IMPLANT
TAPE CLOTH 4X10 WHT NS (GAUZE/BANDAGES/DRESSINGS) ×4 IMPLANT
TAPE CLOTH SURG 4X10 WHT LF (GAUZE/BANDAGES/DRESSINGS) ×2 IMPLANT
TOWEL OR 17X24 6PK STRL BLUE (TOWEL DISPOSABLE) ×8 IMPLANT
TOWEL OR 17X26 10 PK STRL BLUE (TOWEL DISPOSABLE) ×8 IMPLANT
TRAP SPECIMEN MUCOUS 40CC (MISCELLANEOUS) ×4 IMPLANT
TRAY FOLEY CATH 16FRSI W/METER (SET/KITS/TRAYS/PACK) ×2 IMPLANT
WATER STERILE IRR 1000ML POUR (IV SOLUTION) ×8 IMPLANT

## 2015-09-06 NOTE — Consult Note (Addendum)
PULMONARY / CRITICAL CARE MEDICINE   Name: [name] MRN: [id number] DOB: 03-Aug-1930    ADMISSION DATE:  09/06/2015 CONSULTATION DATE:  09/06/15  REFERRING MD :  Jordan Likes  CHIEF COMPLAINT:  UE and LE numbness  INITIAL PRESENTATION:  81 y.o. M taken to OR 9/19 with Dr. Jordan Likes of neurosurgery for anterior discectomy / fusion and lumbar laminectomy.  Post op, remained on the vent.  PCCM called for vent management.    STUDIES:  None.  SIGNIFICANT EVENTS: 9/19 - to OR for C2-C3 and C4-5 anterior anterior cervical discectomy with interbody fusion, anterior instrumentation at C2-3 and C4-5, L3-4 decompressive laminectomy with bilateral L3 and l4 foraminotomies.  Returned to ICU on vent.     HISTORY OF PRESENT ILLNESS:  Pt is encephalopathic; therefore, this HPI is obtained from chart review. [name] is a 81 y.o. M with PMH as outlined below.  He has had progressive bilateral upper extremity and lower extremity numbness for which he was evaluated by Dr. Jordan Likes of neurosurgery.  Workup demonstrated evidence of critical stenosis at C2-3 and C4-5 as well as lumbar stenosis at L3-4 and L4-5.  On 9/19, he was taken to the OR for C2-C3 and C4-5 anterior anterior cervical discectomy with interbody fusion, anterior instrumentation at C2-3 and C4-5, L3-4 decompressive laminectomy with bilateral L3 and l4 foraminotomies.  Following the procedure, he remained on the ventilator and PCCM was consulted for vent management.  Of note, pt was in prone positioning for part of the procedure and upon OR staff turning him supine, they noted some facial and possible tongue edema.  There was a concern for possible angioedema.  In addition, neurosurgery had mentioned that they were concerned for new CVA given that pt was not moving left leg.   PAST MEDICAL HISTORY :   has a past medical history of Hypertension; Diabetes mellitus without complication; DDD (degenerative disc disease); Vertigo; High  cholesterol; Peripheral vascular disease; Pneumonia; History of kidney stones; gallstones; GERD (gastroesophageal reflux disease); Headache; and Wears dentures.  has past surgical history that includes Cholecystectomy; Skin graft; Vein Surgery; Diagnostic laparoscopy; Multiple tooth extractions; Vascular surgery; and Eye surgery (Bilateral). Prior to Admission medications   Medication Sig Start Date End Date Taking? Authorizing Provider  aspirin EC 81 MG tablet Take 81 mg by mouth daily.   Yes Historical Provider, MD  folic acid (FOLVITE) 1 MG tablet Take 1 mg by mouth daily.  06/01/15  Yes Historical Provider, MD  hydrochlorothiazide (HYDRODIURIL) 25 MG tablet Take 25 mg by mouth daily as needed (for leg swelling).  07/14/15  Yes Historical Provider, MD  metFORMIN (GLUCOPHAGE) 500 MG tablet Take 500 mg by mouth 2 (two) times daily.   Yes Historical Provider, MD  methotrexate 2.5 MG tablet Take 20 mg by mouth every Saturday. Takes every saturday   Yes Historical Provider, MD  propranolol (INDERAL) 80 MG tablet Take 80 mg by mouth daily.   Yes Historical Provider, MD  rosuvastatin (CRESTOR) 10 MG tablet Take 10 mg by mouth daily.   Yes Historical Provider, MD  telmisartan (MICARDIS) 80 MG tablet Take 80 mg by mouth daily.   Yes Historical Provider, MD  oxyCODONE-acetaminophen (PERCOCET/ROXICET) 5-325 MG per tablet Take 1 tablet by mouth every 4 (four) hours as needed for pain.    Historical Provider, MD   Allergies  Allergen Reactions  . Penicillins Other (See Comments)    unknown  . Adhesive [Tape] Rash    FAMILY HISTORY:  History reviewed.  No pertinent family history.  SOCIAL HISTORY:  reports that he has quit smoking. He does not have any smokeless tobacco history on file. He reports that he does not drink alcohol or use illicit drugs.  REVIEW OF SYSTEMS:  Unable to obtain as pt is encephalopathic.  SUBJECTIVE:   VITAL SIGNS: Temp:  [97.2 F (36.2 C)-98.1 F (36.7 C)] 97.2 F (36.2  C) (09/19 1500) Pulse Rate:  [52-79] 79 (09/19 1521) Resp:  [9-18] 10 (09/19 1521) BP: (74-183)/(45-79) 98/53 mmHg (09/19 1521) SpO2:  [97 %-100 %] 100 % (09/19 1521) FiO2 (%):  [40 %] 40 % (09/19 1521) Weight:  [85.276 kg (188 lb)] 85.276 kg (188 lb) (09/19 0626) HEMODYNAMICS:   VENTILATOR SETTINGS: Vent Mode:  [-] PRVC FiO2 (%):  [40 %] 40 % Set Rate:  [10 bmp] 10 bmp PEEP:  [5 cmH20] 5 cmH20 Plateau Pressure:  [26 cmH20] 26 cmH20 INTAKE / OUTPUT: Intake/Output      09/18 0701 - 09/19 0700 09/19 0701 - 09/20 0700   I.V. (mL/kg)  2600 (30.5)   Total Intake(mL/kg)  2600 (30.5)   Urine (mL/kg/hr)  515 (0.7)   Drains  100 (0.1)   Blood  150 (0.2)   Total Output   765   Net   +1835          PHYSICAL EXAMINATION: General: Adult male, in NAD. Neuro: Not responsive post op.  HEENT: [place]/AT. Sclerae anicteric, mild tongue edema. Cardiovascular: RRR, no M/R/G.  Lungs: Respirations even and unlabored.  CTA bilaterally, No W/R/R. Abdomen: BS x 4, soft, NT/ND.  Musculoskeletal: No gross deformities, no edema.  Skin: Intact, warm, no rashes.  LABS:  CBC  Recent Labs Lab 09/02/15 1439  WBC 9.1  HGB 15.6  HCT 46.6  PLT 143*   Coag's No results for input(s): APTT, INR in the last 168 hours. BMET  Recent Labs Lab 09/02/15 1439  NA 138  K 4.5  CL 103  CO2 28  BUN 12  CREATININE 0.86  GLUCOSE 86   Electrolytes  Recent Labs Lab 09/02/15 1439  CALCIUM 9.3   Sepsis Markers No results for input(s): LATICACIDVEN, PROCALCITON, O2SATVEN in the last 168 hours. ABG No results for input(s): PHART, PCO2ART, PO2ART in the last 168 hours. Liver Enzymes No results for input(s): AST, ALT, ALKPHOS, BILITOT, ALBUMIN in the last 168 hours. Cardiac Enzymes No results for input(s): TROPONINI, PROBNP in the last 168 hours. Glucose  Recent Labs Lab 09/02/15 1344 09/06/15 0631  GLUCAP 88 122*    Imaging Dg Cervical Spine 1 View  09/06/2015   CLINICAL DATA:  ACDF C2-3  and C4-5  EXAM: DG C-ARM 61-120 MIN; DG CERVICAL SPINE - 1 VIEW  COMPARISON:  None.  FLUOROSCOPY TIME:  10 seconds  One image  FINDINGS: Single lateral intraoperative fluoroscopic image is provided of the cervical spine. Anterior cervical fusion with interbody spacer device at C2-3 and C4-5.  IMPRESSION: ACDF C2-3 and C4-5.   Electronically Signed   By: Elige Ko   On: 09/06/2015 12:43   Dg Lumbar Spine 1 View  09/06/2015   CLINICAL DATA:  Laminectomy.  EXAM: LUMBAR SPINE - 1 VIEW  COMPARISON:  None.  FINDINGS: Metallic patch that lumbar vertebra are numbered with the lowest lumbar shaped vertebra as L5. This may be a transitional vertebra. 11 mm anterolisthesis of L4 on L5 noted. Metallic markers noted posteriorly at the L4-L5 level  IMPRESSION: Metallic marker noted posteriorly at the L4-L5 level.   Electronically  Signed   ByMaisie Fus  Register   On: 09/06/2015 13:28   Dg C-arm 1-60 Min  09/06/2015   CLINICAL DATA:  ACDF C2-3 and C4-5  EXAM: DG C-ARM 61-120 MIN; DG CERVICAL SPINE - 1 VIEW  COMPARISON:  None.  FLUOROSCOPY TIME:  10 seconds  One image  FINDINGS: Single lateral intraoperative fluoroscopic image is provided of the cervical spine. Anterior cervical fusion with interbody spacer device at C2-3 and C4-5.  IMPRESSION: ACDF C2-3 and C4-5.   Electronically Signed   By: Elige Ko   On: 09/06/2015 12:43    ASSESSMENT / PLAN:  NEUROLOGIC A:   Acute metabolic encephalopathy. Critical stenosis at C2-3 and C4-5 as well as lumbar stenosis at L3-4 and L4-5 - s/p C2-C3 and C4-5 anterior anterior cervical discectomy with interbody fusion, anterior instrumentation at C2-3 and C4-5, L3-4 decompressive laminectomy with bilateral L3 and l4 foraminotomies (09/06/15 by Dr. Jordan Likes). Concern for new infarct per neurosurgery. Hx DDD, vertigo, headaches. P:   Sedation:  Fentanyl PRN / Midazolam PRN. RASS goal: 0 to -1. Daily WUA.  Post op care per neurosurgery. CT head STAT. D/c dilaudid. Hold  outapatient percocet.  PULMONARY OETT 9/19 >>> A: VDRF following surgery 9/19. P:   Full mechanical support, wean as able. VAP prevention measures. SBT in AM if able. CXR in AM.  CARDIOVASCULAR A:  Hypotension post op. Hx HTN, HLD, PVD. P:  500cc bolus now. Low dose neosynephrine as needed to maintain SBP > 90. Hold outpatient telmisartan, propranolol. Check lactate, troponin.  RENAL A:   No acute issues - no labs since 9/15. P:   NS @ 100. Correct electrolytes as indicated. CMP now. BMP in AM.  GASTROINTESTINAL A:   GERD. Nutrition. P:  SUP: Pantoprazole. NPO.  HEMATOLOGIC A:   VTE Prophylaxis. P:  SCD's. CBC now and in AM.  INFECTIOUS A:   Surgical prophylaxis. P:   Monitor clinically.  ENDOCRINE A:   DM. P:   SSI. Hold outpatient metformin.   Family updated: None.  Interdisciplinary Family Meeting v Palliative Care Meeting:  Due by: 9/25.   Rutherford Guys, [place] - C  Pulmonary & Critical Care Medicine Pager: [phone]  or [phone] 09/06/2015, 3:42 PM  Attending Note:  81 year old male with PMH above who had a posterior approach laminectomy then post op patient was unresponsive with facial swelling and a rash so decision was made to leave patient intubated.  Admit patient to the ICU and PCCM was consulted for vent management.  Patient continues to have facial swelling on exam with clear lungs.  Will adjust vent for ABG.  Will check CXR.  Concern is for intraop CVA.  Will order a head CT and attempt to minimize sedation.  ?anaphylaxis vs post op from being face down vs angioedema, will continue decadron, start H2 and H1 blockers as well.  The patient is critically ill with multiple organ systems failure and requires high complexity decision making for assessment and support, frequent evaluation and titration of therapies, application of advanced monitoring technologies and extensive interpretation of multiple databases.    Critical Care Time devoted to patient care services described in this note is  35  Minutes. This time reflects time of care of this signee Dr Koren Bound. This critical care time does not reflect procedure time, or teaching time or supervisory time of PA/NP/Med student/Med Resident etc but could involve care discussion time.  Alyson Reedy, M.D. [facility]  Pulmonary/Critical Care Medicine. Pager: [phone]. After hours pager: [phone].

## 2015-09-06 NOTE — Anesthesia Preprocedure Evaluation (Addendum)
Anesthesia Evaluation  Patient identified by MRN, date of birth, ID band Patient awake    Reviewed: Allergy & Precautions, NPO status , Patient's Chart, lab work & pertinent test results  Airway Mallampati: II  TM Distance: <3 FB Neck ROM: Limited    Dental  (+) Edentulous Upper   Pulmonary former smoker,    breath sounds clear to auscultation       Cardiovascular hypertension, + Peripheral Vascular Disease   Rhythm:Regular Rate:Normal     Neuro/Psych    GI/Hepatic GERD  ,  Endo/Other  diabetes, Well Controlled  Renal/GU      Musculoskeletal  (+) Arthritis ,   Abdominal   Peds  Hematology   Anesthesia Other Findings   Reproductive/Obstetrics                           Anesthesia Physical Anesthesia Plan  ASA: III  Anesthesia Plan: General   Post-op Pain Management:    Induction: Intravenous  Airway Management Planned: Oral ETT and Video Laryngoscope Planned  Additional Equipment:   Intra-op Plan:   Post-operative Plan: Extubation in OR and Possible Post-op intubation/ventilation  Informed Consent: I have reviewed the patients History and Physical, chart, labs and discussed the procedure including the risks, benefits and alternatives for the proposed anesthesia with the patient or authorized representative who has indicated his/her understanding and acceptance.   Dental advisory given  Plan Discussed with: CRNA and Surgeon  Anesthesia Plan Comments:         Anesthesia Quick Evaluation

## 2015-09-06 NOTE — Progress Notes (Signed)
eLink Physician-Brief Progress Note Patient Name: [name] DOB: 1930-04-19 MRN: [id number]   Date of Service  09/06/2015  HPI/Events of Note  Oliguria.   eICU Interventions  Will bolus with 0.9 NaCl 1 liter IV over 1 hour now.     Intervention Category Intermediate Interventions: Oliguria - evaluation and management  Sommer,Steven Eugene 09/06/2015, 10:59 PM

## 2015-09-06 NOTE — H&P (Signed)
[name] is an 81 y.o. male.   Chief Complaint: Trouble walking HPI: 81 year old male with progressive bilateral upper extremity and lower extremity numbness. Seizures and weakness. Workup demonstrates evidence of 2 very significant problems. First patient has critical cervical stenosis secondary to broad-based disc herniations and ligamentous buckling at C2-3 and C4-5. Patient also has critical lumbar stenosis at L3-4 and to lesser degree at L4-5. Patient presents now for anterior cervical decompression and fusion followed by lumbar decompression. Past Medical History  Diagnosis Date  . Hypertension   . Diabetes mellitus without complication   . DDD (degenerative disc disease)   . Vertigo   . High cholesterol   . Peripheral vascular disease   . Pneumonia   . History of kidney stones   . Hx of gallstones   . GERD (gastroesophageal reflux disease)   . Headache   . Wears dentures     Past Surgical History  Procedure Laterality Date  . Cholecystectomy    . Skin graft      Left leg  . Vein surgery    . Diagnostic laparoscopy    . Multiple tooth extractions    . Vascular surgery    . Eye surgery Bilateral     History reviewed. No pertinent family history. Social History:  reports that he has quit smoking. He does not have any smokeless tobacco history on file. He reports that he does not drink alcohol or use illicit drugs.  Allergies:  Allergies  Allergen Reactions  . Penicillins Other (See Comments)    unknown  . Adhesive [Tape] Rash    Medications Prior to Admission  Medication Sig Dispense Refill  . aspirin EC 81 MG tablet Take 81 mg by mouth daily.    . folic acid (FOLVITE) 1 MG tablet Take 1 mg by mouth daily.     . hydrochlorothiazide (HYDRODIURIL) 25 MG tablet Take 25 mg by mouth daily as needed (for leg swelling).     . metFORMIN (GLUCOPHAGE) 500 MG tablet Take 500 mg by mouth 2 (two) times daily.    . methotrexate 2.5 MG tablet Take 20 mg by mouth every  Saturday. Takes every saturday    . propranolol (INDERAL) 80 MG tablet Take 80 mg by mouth daily.    . rosuvastatin (CRESTOR) 10 MG tablet Take 10 mg by mouth daily.    Marland Kitchen telmisartan (MICARDIS) 80 MG tablet Take 80 mg by mouth daily.    Marland Kitchen oxyCODONE-acetaminophen (PERCOCET/ROXICET) 5-325 MG per tablet Take 1 tablet by mouth every 4 (four) hours as needed for pain.      Results for orders placed or performed during the hospital encounter of 09/06/15 (from the past 48 hour(s))  Glucose, capillary     Status: Abnormal   Collection Time: 09/06/15  6:31 AM  Result Value Ref Range   Glucose-Capillary 122 (H) 65 - 99 mg/dL   Comment 1 Notify RN    Comment 2 Document in Chart    No results found.  Pertinent items are noted in HPI.  Blood pressure 170/71, pulse 73, temperature 98.1 F (36.7 C), temperature source Oral, resp. rate 18, weight 85.276 kg (188 lb), SpO2 97 %.  Patient is awake and alert. He is oriented and appropriate. He is fluent with intact judgment and insight. Cranial nerve function is intact. Motor examination of the upper extremities reveals distal weakness in both upper extremities with decreased dexterity. Lower extremity strength with bilateral dorsiflexion weakness in plantar flexion weakness. Patient is hyperreflexic  in his upper extremities and lower extremity reflexes are absent. Examination head ears eyes and there is unremarkable. Chest and abdomen are benign. Extremities free from injury deformity. Assessment/Plan  C2-3, C4-5 stenosis with severe cervical myelopathy. Plan C2-3 and C4-5 anterior cervical discectomy and fusion with interbody peek cages, locally harvested autograft, and anterior plate instrumentation. Patient also with critical lumbar stenosis. Plan L3-4 and L4-5 decompressive laminectomy and foraminotomies. Risks and benefits of been explained. Patient wishes to proceed.POOL,HENRY A 09/06/2015, 7:35 AM

## 2015-09-06 NOTE — Progress Notes (Signed)
Orthopedic Tech Progress Note Patient Details:  [name] 06/02/1930 [id number]  Ortho Devices Type of Ortho Device: Soft collar Ortho Device/Splint Interventions: Application   [name] 09/06/2015, 7:08 PM

## 2015-09-06 NOTE — Progress Notes (Signed)
ANTIBIOTIC CONSULT NOTE - INITIAL  Pharmacy Consult for vancomycin Indication: surgical prophylaxis  Allergies  Allergen Reactions  . Penicillins Other (See Comments)    unknown  . Adhesive [Tape] Rash    Patient Measurements: Weight: 188 lb (85.276 kg)   Vital Signs: Temp: 97.2 F (36.2 C) (09/19 1500) Temp Source: Oral (09/19 0626) BP: 98/53 mmHg (09/19 1521) Pulse Rate: 79 (09/19 1521) Intake/Output from previous day:   Intake/Output from this shift: Total I/O In: 2600 [I.V.:2600] Out: 765 [Urine:515; Drains:100; Blood:150]  Labs: No results for input(s): WBC, HGB, PLT, LABCREA, CREATININE in the last 72 hours. Estimated Creatinine Clearance: 73.6 mL/min (by C-G formula based on Cr of 0.86). No results for input(s): VANCOTROUGH, VANCOPEAK, VANCORANDOM, GENTTROUGH, GENTPEAK, GENTRANDOM, TOBRATROUGH, TOBRAPEAK, TOBRARND, AMIKACINPEAK, AMIKACINTROU, AMIKACIN in the last 72 hours.   Microbiology: Recent Results (from the past 720 hour(s))  Surgical pcr screen     Status: None   Collection Time: 09/02/15  2:38 PM  Result Value Ref Range Status   MRSA, PCR NEGATIVE NEGATIVE Final   Staphylococcus aureus NEGATIVE NEGATIVE Final    Comment:        The Xpert SA Assay (FDA approved for NASAL specimens in patients over 77 years of age), is one component of a comprehensive surveillance program.  Test performance has been validated by [facility] for patients greater than or equal to 43 year old. It is not intended to diagnose infection nor to guide or monitor treatment.     Medical History: Past Medical History  Diagnosis Date  . Hypertension   . Diabetes mellitus without complication   . DDD (degenerative disc disease)   . Vertigo   . High cholesterol   . Peripheral vascular disease   . Pneumonia   . History of kidney stones   . Hx of gallstones   . GERD (gastroesophageal reflux disease)   . Headache   . Wears dentures     Assessment: 66 YOM s/p  disectomy and fusion with drain placement. Received pre-op vancomycin 1g IV this morning at 0752. SCr 0.86, CrCl ~70-66mL/min (labs from 9/15)  Goal of Therapy:  Vancomycin trough level 15-20 mcg/ml  Plan:  -vancomycin 1gh  IV q12h starting at 1830 in order to avoid shift change and also have patient receive dose within 12h of pre-op administration -BMET in the morning -follow renal function, clinical progression, LOT  Panfilo Ketchum D. Shawntez Dickison, PharmD, BCPS Clinical Pharmacist Pager: [phone] 09/06/2015 3:33 PM

## 2015-09-06 NOTE — Op Note (Signed)
Date of procedure: 09/06/2015  Date of dictation: same  Service: Neurosurgery  Preoperative diagnosis: C2-3, C4-5 stenosis with myelopathy.  L3-4 stenosis with neurogenic claudication  Postoperative diagnosis: same  Procedure Name: C2-3 and C4-5 anterior cervical discectomy with interbody fusion utilizing interbody peek cage and local autograft. Anterior instrumentation at C2-3 and at C4-5.  L3-4 decompressive laminectomy with bilateral L3 and L4 foraminotomies  Surgeon:Henry A.Pool, M.D.  Asst. Surgeon: Newell Coral (ACDF)  Cabbell ( laminectomy)  Anesthesia: General  Indication:81 year old male with severe quadriparesis. Workup demonstrates evidence of 2 critical problems. The patient has marked cervical stenosis with spinal cord signal abnormality at C2-C3 and C4-5 with autofusion of the cervical spine at C3-4, C5-6, C6-7, C7-T1. The patient also has critical spinal stenosis at L3-4 with complete obliteration of his spinal canal at this level. Patient presents now for 2 level anterior cervical decompression and lumbar decompressive surgery.  Operative note:after induction, patient positioned supine with neck slightly extended and held in place with halter traction. Anterior cervical region is prepped and draped sterilely. Incision made overlying C4. This carried down sharply to the platysma. Platysma was divided vertically and dissection proceeding along the medial border of the sternocleidomastoid muscle and carotid sheath. Trachea and esophagus were mobilized and retracted towards the left. Prevertebral fascia was stripped off the anterior spinal column. Longus Coyle muscles elevated bilaterally. C4-5 disc space was identified with fluoroscopy. Large anterior osteophytes were partially resected. Bone was saved for use in later autografting. Disc space was incised. Discectomy was then performed using pituitary rongeurs, Kerrison rongeurs, high-speed drill, and various curettes. Discectomy was  carried down to the posterior annulus. Microscope then brought to the field using the discectomy. Her many aspects annulus and osteophytes removed using a high-speed drill down to the level of the posterior longitudinal ligament. Posterior longitudinal ligament was elevated and resected piecemeal fashion using Kerrison rongeurs. Underlying thecal sac was identified. Wide central decompression was then performed undercutting the bodies of C4 and C5. Decompression proceeded and regional foramen. Wide anterior foraminotomies were performed on course exiting C5 nerve roots bilaterally. At this point very thorough decompression had been achieved. 8 mm Medtronic anatomic peek cage was packed with locally harvested autograft and impacted into place. 25 mm Atlantis anterior cervical plate was then placed over the C4 and C5 levels. This isn't cancer fluoroscopic guidance utilizing 15 mm variable angle screws to regional levels. All 4 screws and the final tightening and found to be solid with the bone. Locking screws engaged both levels. Final images reveal good position of the cage of proper operative level with normal alignment of the spine. Procedure was then repeated at C2-3 again without complication. Good decompression was achieved. Interbody fusion and anterior instrumentation uncomplicated. Longus and irrigated them I solution. Gelfoam was placed topically for hemostasis. Medium Hemovac drain was left in the prevertebral space. Wounds and closed in layers with Vicryl sutures. Steri-Strips and sterile dressing were applied.  No apparent complications.  Patient's neck was secured in an Aspen cervical collar. He was gently turned prone on the Wilson frame and properly padded. Lumbar region prepped and draped sterilely. Incision made overlying L3-4. Dissection performed. Retractor placed. Fluoroscopy used. Limb was confirmed. Decompressive laminectomies and performed using R Rogers Kerrison rongeurs a high-speed drill  to remove the entire lamina of L3 and medial aspect the L3-4 facet joints bilaterally and the superior aspect the L4 lamina. We will frame elevated and resected the skull fashion. The lateral gutters undercut. Foraminotomies performed on the course  exiting L3 and L4 nerve roots bilaterally. At this point very thorough decompression and then achieved. There is no evidence of injury thecal sac and nerve roots. Gelfoam was placed topically for hemostasis which centigrade. Microscope and retractor system were removed. Using irrigated one final time. Gelfoam was placed topically for hemostasis. Wounds and closed in layers with Vicryl sutures. Steri-Strips and sterile dressing were applied. No apparent complications. Patient tolerated the procedure well and he returns to the recovery room postop.

## 2015-09-06 NOTE — Transfer of Care (Signed)
Immediate Anesthesia Transfer of Care Note  Patient: [name]  Procedure(s) Performed: Procedure(s): Anterior Cervical Decompression Fusion Cervical two,Cervical three,Cervical four-,Cervical five  (N/A) Lumbar three-four  decompresssive laminectomy   (N/A)  Patient Location: PACU  Anesthesia Type:General  Level of Consciousness: sedated and unresponsive  Airway & Oxygen Therapy: Patient placed on Ventilator (see vital sign flow sheet for setting)  Post-op Assessment: Report given to RN and Post -op Vital signs reviewed and stable  Post vital signs: Reviewed and stable  Last Vitals:  Filed Vitals:   09/06/15 1303  BP:   Pulse:   Temp: 36.2 C  Resp:     Complications: adverse drug reaction

## 2015-09-06 NOTE — Progress Notes (Signed)
Utilization review completed.  

## 2015-09-06 NOTE — Brief Op Note (Signed)
09/06/2015  12:40 PM  PATIENT:  [name]  81 y.o. male  PRE-OPERATIVE DIAGNOSIS:  stenosis  POST-OPERATIVE DIAGNOSIS:  stenosis  PROCEDURE:  Procedure(s): Anterior Cervical Decompression Fusion Cervical two,Cervical three,Cervical four-,Cervical five  (N/A) Lumbar three-four  decompresssive laminectomy   (N/A)  SURGEON:  Surgeon(s) and Role:    * Julio Sicks, MD - Primary    * Shirlean Kelly, MD - Assisting    * Coletta Memos, MD - Assisting  PHYSICIAN ASSISTANT:   ASSISTANTS:    ANESTHESIA:   general  EBL:  Total I/O In: 2000 [I.V.:2000] Out: 650 [Urine:500; Blood:150]  BLOOD ADMINISTERED:none  DRAINS: (med) Hemovact drain(s) in the prevertebral space with  Suction Open   LOCAL MEDICATIONS USED:  MARCAINE     SPECIMEN:  No Specimen  DISPOSITION OF SPECIMEN:  N/A  COUNTS:  YES  TOURNIQUET:  * No tourniquets in log *  DICTATION: .Dragon Dictation  PLAN OF CARE: Admit to inpatient   PATIENT DISPOSITION:  PACU - hemodynamically stable.   Delay start of Pharmacological VTE agent (>24hrs) due to surgical blood loss or risk of bleeding: yes

## 2015-09-06 NOTE — Progress Notes (Addendum)
Pt continues to have low urine output, greatest output 15cc/hr. Talk to [facility], Critical Care RN. Discussed previous urine output per report, fluids received, and continued low urine output. Will mention to MD in box.  Will continue to monitor.   ~2258: Talk to Dr. Arsenio Loader about decrease urine output. Discussed pt's case. Order for NS bolus

## 2015-09-06 NOTE — Progress Notes (Signed)
Patient with significant left hemiparesis. Follow-up head CT scan demonstrates subtle changes worrisome for possible MCA distribution infarct versus hypoperfusion. Now awake and following commands on the right side. Appears aware. Still intubated. Plan to try to drive up his blood pressure with fluid. Continue aspirin. Patient not a candidate for thrombolytics secondary to recent surgery.

## 2015-09-06 NOTE — Anesthesia Procedure Notes (Signed)
Procedure Name: Intubation Date/Time: 09/06/2015 7:58 AM Performed by: Jerilee Hoh Pre-anesthesia Checklist: Patient identified, Emergency Drugs available, Suction available and Patient being monitored Patient Re-evaluated:Patient Re-evaluated prior to inductionOxygen Delivery Method: Circle system utilized Preoxygenation: Pre-oxygenation with 100% oxygen Intubation Type: IV induction Ventilation: Mask ventilation without difficulty and Oral airway inserted - appropriate to patient size Grade View: Grade I Tube type: Oral Tube size: 7.5 mm Number of attempts: 1 Airway Equipment and Method: Stylet and Video-laryngoscopy Placement Confirmation: ETT inserted through vocal cords under direct vision,  positive ETCO2 and breath sounds checked- equal and bilateral Secured at: 22 cm Tube secured with: Tape Dental Injury: Teeth and Oropharynx as per pre-operative assessment  Comments: Smooth induction and easy mask airway. Head/neck remained neutral throughout intubation. Grade I view. AOI.

## 2015-09-07 ENCOUNTER — Inpatient Hospital Stay (HOSPITAL_COMMUNITY): Payer: Medicare Other

## 2015-09-07 ENCOUNTER — Encounter (HOSPITAL_COMMUNITY): Payer: Self-pay | Admitting: Neurosurgery

## 2015-09-07 DIAGNOSIS — I6340 Cerebral infarction due to embolism of unspecified cerebral artery: Secondary | ICD-10-CM | POA: Insufficient documentation

## 2015-09-07 DIAGNOSIS — I63511 Cerebral infarction due to unspecified occlusion or stenosis of right middle cerebral artery: Secondary | ICD-10-CM | POA: Insufficient documentation

## 2015-09-07 DIAGNOSIS — I63411 Cerebral infarction due to embolism of right middle cerebral artery: Secondary | ICD-10-CM

## 2015-09-07 DIAGNOSIS — I6789 Other cerebrovascular disease: Secondary | ICD-10-CM

## 2015-09-07 LAB — GLUCOSE, CAPILLARY
GLUCOSE-CAPILLARY: 124 mg/dL — AB (ref 65–99)
GLUCOSE-CAPILLARY: 138 mg/dL — AB (ref 65–99)
Glucose-Capillary: 126 mg/dL — ABNORMAL HIGH (ref 65–99)
Glucose-Capillary: 146 mg/dL — ABNORMAL HIGH (ref 65–99)
Glucose-Capillary: 151 mg/dL — ABNORMAL HIGH (ref 65–99)
Glucose-Capillary: 130 mg/dL — ABNORMAL HIGH (ref 65–99)

## 2015-09-07 LAB — CBC
HCT: 36.3 % — ABNORMAL LOW (ref 39.0–52.0)
Hemoglobin: 11.9 g/dL — ABNORMAL LOW (ref 13.0–17.0)
MCH: 29.5 pg (ref 26.0–34.0)
MCHC: 32.8 g/dL (ref 30.0–36.0)
MCV: 89.9 fL (ref 78.0–100.0)
PLATELETS: 99 10*3/uL — AB (ref 150–400)
RBC: 4.04 MIL/uL — ABNORMAL LOW (ref 4.22–5.81)
RDW: 15.2 % (ref 11.5–15.5)
WBC: 10.2 10*3/uL (ref 4.0–10.5)

## 2015-09-07 LAB — BASIC METABOLIC PANEL
Anion gap: 8 (ref 5–15)
BUN: 14 mg/dL (ref 6–20)
CALCIUM: 8 mg/dL — AB (ref 8.9–10.3)
CHLORIDE: 104 mmol/L (ref 101–111)
CO2: 24 mmol/L (ref 22–32)
CREATININE: 1.05 mg/dL (ref 0.61–1.24)
GFR calc Af Amer: >60 mL/min (ref >60)
GFR calc non Af Amer: >60 mL/min (ref >60)
Glucose, Bld: 139 mg/dL — ABNORMAL HIGH (ref 65–99)
Potassium: 4 mmol/L (ref 3.5–5.1)
SODIUM: 136 mmol/L (ref 135–145)

## 2015-09-07 LAB — MAGNESIUM: MAGNESIUM: 1.3 mg/dL — AB (ref 1.7–2.4)

## 2015-09-07 LAB — PHOSPHORUS: Phosphorus: 2.8 mg/dL (ref 2.5–4.6)

## 2015-09-07 MED ORDER — ASPIRIN 300 MG RE SUPP
300.0000 mg | Freq: Every day | RECTAL | Status: DC
  Administered 2015-09-07 – 2015-09-10 (×4): 300 mg via RECTAL
  Filled 2015-09-07 (×4): qty 1

## 2015-09-07 NOTE — Anesthesia Postprocedure Evaluation (Signed)
  Anesthesia Post-op Note  Patient: [name]  Procedure(s) Performed: Procedure(s): Anterior Cervical Decompression Fusion Cervical two,Cervical three,Cervical four-,Cervical five  (N/A) Lumbar three-four  decompresssive laminectomy   (N/A)  Patient Location: NICU  Anesthesia Type:General  Level of Consciousness: sedated, lethargic and responds to stimulation  Airway and Oxygen Therapy: Patient placed on Ventilator (see vital sign flow sheet for setting) and likely has had periop CVA, c minimal movement L side  Post-op Pain: none  Post-op Assessment: Post-op Vital signs reviewed LLE Motor Response: Non-purposeful movement   RLE Motor Response: Purposeful movement        Post-op Vital Signs: stable  Last Vitals:  Filed Vitals:   09/07/15 0811  BP: 149/75  Pulse: 99  Temp:   Resp: 22    Complications: Periop stroke

## 2015-09-07 NOTE — Progress Notes (Signed)
Stable overnight. Remains on ventilator. Sedated.  Awakens to voice. Follows commands briskly with his right upper and right lower extremity. Spastic left hemiparesis but now with some weak voluntary movement. Dressing clean and dry. Chest clear. Abdomen soft.  Perioperative right hemispheric stroke, unclear whether secondary to hypoperfusion or embolism. MRI scan pending for this morning. Continue aspirin. Blood pressure in except range currently. Hopefully patient will be weaned to extubation this morning.

## 2015-09-07 NOTE — Consult Note (Signed)
PULMONARY / CRITICAL CARE MEDICINE   Name: [name] MRN: [id number] DOB: [birth year]    ADMISSION DATE:  09/06/2015 CONSULTATION DATE:  09/06/15  REFERRING MD :  Jordan Likes  CHIEF COMPLAINT:  UE and LE numbness  INITIAL PRESENTATION:  81 y.o. M taken to OR 9/19 with Dr. Jordan Likes of neurosurgery for anterior discectomy / fusion and lumbar laminectomy.  Post op, remained on the vent.  PCCM called for vent management.    STUDIES:  CT head (9/19)- Suggestive of rt MCA infarct.  SIGNIFICANT EVENTS: 9/19 - to OR for C2-C3 and C4-5 anterior anterior cervical discectomy with interbody fusion, anterior instrumentation at C2-3 and C4-5, L3-4 decompressive laminectomy with bilateral L3 and l4 foraminotomies.  Returned to ICU on vent.   HISTORY OF PRESENT ILLNESS:  Pt is encephalopathic; therefore, this HPI is obtained from chart review. [name] is a 81 y.o. M with PMH as outlined below.  He has had progressive bilateral upper extremity and lower extremity numbness for which he was evaluated by Dr. Jordan Likes of neurosurgery.  Workup demonstrated evidence of critical stenosis at C2-3 and C4-5 as well as lumbar stenosis at L3-4 and L4-5.  On 9/19, he was taken to the OR for C2-C3 and C4-5 anterior anterior cervical discectomy with interbody fusion, anterior instrumentation at C2-3 and C4-5, L3-4 decompressive laminectomy with bilateral L3 and l4 foraminotomies.  Following the procedure, he remained on the ventilator and PCCM was consulted for vent management.  Of note, pt was in prone positioning for part of the procedure and upon OR staff turning him supine, they noted some facial and possible tongue edema.  There was a concern for possible angioedema.  In addition, neurosurgery had mentioned that they were concerned for new CVA given that pt was not moving left leg.  PAST MEDICAL HISTORY :   has a past medical history of Hypertension; Diabetes mellitus without complication; DDD (degenerative disc  disease); Vertigo; High cholesterol; Peripheral vascular disease; Pneumonia; History of kidney stones; gallstones; GERD (gastroesophageal reflux disease); Headache; and Wears dentures.  has past surgical history that includes Cholecystectomy; Skin graft; Vein Surgery; Diagnostic laparoscopy; Multiple tooth extractions; Vascular surgery; and Eye surgery (Bilateral). Prior to Admission medications   Medication Sig Start Date End Date Taking? Authorizing Illiana Losurdo  aspirin EC 81 MG tablet Take 81 mg by mouth daily.   Yes Historical Boluwatife Mutchler, MD  folic acid (FOLVITE) 1 MG tablet Take 1 mg by mouth daily.  06/01/15  Yes Historical Christiaan Strebeck, MD  hydrochlorothiazide (HYDRODIURIL) 25 MG tablet Take 25 mg by mouth daily as needed (for leg swelling).  07/14/15  Yes Historical Herley Bernardini, MD  metFORMIN (GLUCOPHAGE) 500 MG tablet Take 500 mg by mouth 2 (two) times daily.   Yes Historical Sabah Zucco, MD  methotrexate 2.5 MG tablet Take 20 mg by mouth every Saturday. Takes every saturday   Yes Historical Leasa Kincannon, MD  propranolol (INDERAL) 80 MG tablet Take 80 mg by mouth daily.   Yes Historical Torrell Krutz, MD  rosuvastatin (CRESTOR) 10 MG tablet Take 10 mg by mouth daily.   Yes Historical Toshie Demelo, MD  telmisartan (MICARDIS) 80 MG tablet Take 80 mg by mouth daily.   Yes Historical Devontae Casasola, MD  oxyCODONE-acetaminophen (PERCOCET/ROXICET) 5-325 MG per tablet Take 1 tablet by mouth every 4 (four) hours as needed for pain.    Historical Leeann Bady, MD   Allergies  Allergen Reactions  . Penicillins Other (See Comments)    unknown  . Adhesive [Tape] Rash    FAMILY  HISTORY:  History reviewed. No pertinent family history.  SOCIAL HISTORY:  reports that he has quit smoking. He does not have any smokeless tobacco history on file. He reports that he does not drink alcohol or use illicit drugs.  REVIEW OF SYSTEMS:  Unable to obtain as pt is encephalopathic.  SUBJECTIVE: More awake today. Doing well on a weaning trial.    VITAL SIGNS: Temp:  [96.4 F (35.8 C)-98.8 F (37.1 C)] 98.7 F (37.1 C) (09/20 0811) Pulse Rate:  [51-112] 112 (09/20 0900) Resp:  [9-25] 20 (09/20 0900) BP: (74-149)/(44-95) 149/75 mmHg (09/20 0900) SpO2:  [98 %-100 %] 100 % (09/20 0900) FiO2 (%):  [30 %-40 %] 30 % (09/20 0900) Weight:  [196 lb 10.4 oz (89.2 kg)] 196 lb 10.4 oz (89.2 kg) (09/20 0600) HEMODYNAMICS:   VENTILATOR SETTINGS: Vent Mode:  [-] PSV;CPAP FiO2 (%):  [30 %-40 %] 30 % Set Rate:  [10 bmp-16 bmp] 16 bmp Vt Set:  [550 mL] 550 mL PEEP:  [5 cmH20] 5 cmH20 Plateau Pressure:  [18 cmH20-26 cmH20] 20 cmH20 INTAKE / OUTPUT: Intake/Output      09/19 0701 - 09/20 0700 09/20 0701 - 09/21 0700   I.V. (mL/kg) 4507.5 (50.5) 300 (3.4)   IV Piggyback 700    Total Intake(mL/kg) 5207.5 (58.4) 300 (3.4)   Urine (mL/kg/hr) 803 (0.4)    Drains 200 (0.1)    Blood 150 (0.1)    Total Output 1153     Net +4054.5 +300          PHYSICAL EXAMINATION: General: Adult male, in NAD. Neuro: Awake, Responds to commands. Cannot move left side. HEENT: PERRLA, EOMI.. No significant edema of tongue noted Cardiovascular: RRR, no M/R/G.  Lungs: Respirations even and unlabored.  CTA bilaterally, No W/R/R. Abdomen: BS x 4, soft, NT/ND.  Musculoskeletal: No gross deformities, no edema.  Skin: Intact, warm, no rashes.  LABS:  CBC  Recent Labs Lab 09/02/15 1439 09/06/15 1635 09/07/15 0233  WBC 9.1 12.5* 10.2  HGB 15.6 14.6 11.9*  HCT 46.6 42.6 36.3*  PLT 143* 96* 99*   Coag's No results for input(s): APTT, INR in the last 168 hours. BMET  Recent Labs Lab 09/02/15 1439 09/06/15 1635 09/07/15 0233  NA 138 138 136  K 4.5 4.0 4.0  CL 103 105 104  CO2 28 25 24   BUN 12 11 14   CREATININE 0.86 1.04 1.05  GLUCOSE 86 205* 139*   Electrolytes  Recent Labs Lab 09/02/15 1439 09/06/15 1635 09/07/15 0233  CALCIUM 9.3 8.6* 8.0*  MG  --   --  1.3*  PHOS  --   --  2.8   Sepsis Markers  Recent Labs Lab 09/06/15 1625   LATICACIDVEN 2.5*   ABG  Recent Labs Lab 09/06/15 1600  PHART 7.402  PCO2ART 40.1  PO2ART 103*   Liver Enzymes  Recent Labs Lab 09/06/15 1635  AST 22  ALT 12*  ALKPHOS 73  BILITOT 1.3*  ALBUMIN 2.8*   Cardiac Enzymes  Recent Labs Lab 09/06/15 1635  TROPONINI <0.03   Glucose  Recent Labs Lab 09/06/15 0631 09/06/15 1651 09/06/15 1939 09/07/15 0011 09/07/15 0422 09/07/15 0737  GLUCAP 122* 188* 161* 138* 126* 146*    Imaging Dg Cervical Spine 1 View  09/06/2015   CLINICAL DATA:  ACDF C2-3 and C4-5  EXAM: DG C-ARM 61-120 MIN; DG CERVICAL SPINE - 1 VIEW  COMPARISON:  None.  FLUOROSCOPY TIME:  10 seconds  One image  FINDINGS: Single lateral  intraoperative fluoroscopic image is provided of the cervical spine. Anterior cervical fusion with interbody spacer device at C2-3 and C4-5.  IMPRESSION: ACDF C2-3 and C4-5.   Electronically Signed   By: Elige Ko   On: 09/06/2015 12:43   Ct Head Wo Contrast  09/06/2015   CLINICAL DATA:  Left-sided weakness following cervical surgery  EXAM: CT HEAD WITHOUT CONTRAST  TECHNIQUE: Contiguous axial images were obtained from the base of the skull through the vertex without intravenous contrast.  COMPARISON:  06/27/2013  FINDINGS: The bony calvarium is intact. No gross soft tissue abnormality is seen. Mild atrophic changes are noted. There is some generalized decreased attenuation identified in the distribution of the right middle cerebral artery suggestive of a early ischemic event. MRI may be helpful further evaluation. No other focal areas of ischemia or hemorrhage are noted.  IMPRESSION: Chronic atrophic changes.  Generalized decreased attenuation in the distribution of the right middle cerebral artery suggestive of early ischemia. MRI may be helpful for further evaluation.  These results were called by telephone at the time of interpretation on 09/06/2015 at 5:34 pm to [facility], the pts nurse, who verbally acknowledged these results.    Electronically Signed   By: Alcide Clever M.D.   On: 09/06/2015 17:37   Dg Lumbar Spine 1 View  09/06/2015   CLINICAL DATA:  Laminectomy.  EXAM: LUMBAR SPINE - 1 VIEW  COMPARISON:  None.  FINDINGS: Metallic patch that lumbar vertebra are numbered with the lowest lumbar shaped vertebra as L5. This may be a transitional vertebra. 11 mm anterolisthesis of L4 on L5 noted. Metallic markers noted posteriorly at the L4-L5 level  IMPRESSION: Metallic marker noted posteriorly at the L4-L5 level.   Electronically Signed   By: Maisie Fus  Register   On: 09/06/2015 13:28   Portable Chest Xray  09/07/2015   CLINICAL DATA:  Respiratory failure, ventilatory support  EXAM: PORTABLE CHEST - 1 VIEW  COMPARISON:  09/06/2015  FINDINGS: Endotracheal tube 7.8 cm above the carina. NG tube enters the stomach with the tip not visualized. Increased left basilar opacity/atelectasis. Right lung remains clear. No enlarging effusion or pneumothorax. Degenerative changes of the spine. Atherosclerosis of the aorta.  IMPRESSION: Stable support apparatus.  Increased left basilar atelectasis   Electronically Signed   By: Judie Petit.  Shick M.D.   On: 09/07/2015 08:20   Dg Chest Port 1 View  09/06/2015   CLINICAL DATA:  Intubation orogastric tube placement  EXAM: PORTABLE CHEST - 1 VIEW  COMPARISON:  08/01/2010  FINDINGS: Endotracheal tube is 3.9 cm above the carina. Orogastric tube crosses the gastroesophageal junction with the. Mild left base opacity suggesting small effusion. Heart size and vascular pattern normal. Right lung clear. Mild atelectasis left lung base.  IMPRESSION: Lines and tubes as described above.   Electronically Signed   By: Esperanza Heir M.D.   On: 09/06/2015 16:45   Dg C-arm 1-60 Min  09/06/2015   CLINICAL DATA:  ACDF C2-3 and C4-5  EXAM: DG C-ARM 61-120 MIN; DG CERVICAL SPINE - 1 VIEW  COMPARISON:  None.  FLUOROSCOPY TIME:  10 seconds  One image  FINDINGS: Single lateral intraoperative fluoroscopic image is provided of the  cervical spine. Anterior cervical fusion with interbody spacer device at C2-3 and C4-5.  IMPRESSION: ACDF C2-3 and C4-5.   Electronically Signed   By: Elige Ko   On: 09/06/2015 12:43    ASSESSMENT / PLAN:  NEUROLOGIC A:   Acute metabolic encephalopathy. Critical stenosis at C2-3  and C4-5 as well as lumbar stenosis at L3-4 and L4-5 - s/p C2-C3 and C4-5 anterior anterior cervical discectomy with interbody fusion, anterior instrumentation at C2-3 and C4-5, L3-4 decompressive laminectomy with bilateral L3 and l4 foraminotomies (09/06/15 by Dr. Jordan Likes). Concern for new infarct per neurosurgery. Hx DDD, vertigo, headaches. P:   Sedation:  Fentanyl PRN / Midazolam PRN. RASS goal: 0 to -1. Daily WUA.  Post op care per neurosurgery. D/c dilaudid. Hold outapatient percocet.  PULMONARY OETT 9/19 >>> A: VDRF following surgery 9/19. ? Angioedema after surgery P:   Full mechanical support, wean as able. VAP prevention measures. Continue decadron and H1, H2 blockers. Advance ET tube by 2 cm Doing well on SBT. May be able to extubate after MRI/MRA of brain.  CARDIOVASCULAR A:  Hypotension post op. Hx HTN, HLD, PVD. P:  Responded to fluid bolus.  RENAL A:   No acute issues - no labs since 9/15. P:   NS @ 150. Correct electrolytes as indicated.  GASTROINTESTINAL A:   GERD. Nutrition. P:  SUP: Pantoprazole. NPO. Tube feeds later today if not extubated.   HEMATOLOGIC A:   VTE Prophylaxis. P:  SCD's. CBC now and in AM.  INFECTIOUS A:   Surgical prophylaxis. P:   Monitor clinically.  ENDOCRINE A:   DM. P:   SSI. Hold outpatient metformin.  Family updated: None.  Interdisciplinary Family Meeting v Palliative Care Meeting:  Due by: 9/25.  Critical care time- 35 mins.  Chilton Greathouse MD [facility] Pager [phone] If no answer or after 3pm call: [phone] 09/07/2015, 9:43 AM

## 2015-09-07 NOTE — Consult Note (Signed)
Referring Physician: Dr Annette Stable, neurosurgery    Chief Complaint: left hemiparesis, CT brain suggestive of right cortical infarct  HPI:                                                                                                                                         [name] is an 81 y.o. male with a past medical history significant for HTN, DM, hypercholesterolemia, PAD, GERD, who underwent C2-3 and C4-5 anterior cervical discectomy with interbody fusion as well as L3-4 decompressive laminectomy with bilateral L3 and L4 foraminotomies on 9/19, and few hours  postoperatively was noted to have significant left hemiparesis that prompted CT brain which demonstrated findings concerning for acute ischemia in the right MCA territory. Neurology consulted for further evaluation. Patient reportedly hypotensive during surgery.  At this moment, he is intubated on the vent, able to follow commands, with a right gaze preference and dense left hemiparesis.  Date last known well: 09/06/15 Time last known well: unable to determine. tPA Given: no, major surgery 9/19, unknown time LKW    Past Medical History  Diagnosis Date  . Hypertension   . Diabetes mellitus without complication   . DDD (degenerative disc disease)   . Vertigo   . High cholesterol   . Peripheral vascular disease   . Pneumonia   . History of kidney stones   . Hx of gallstones   . GERD (gastroesophageal reflux disease)   . Headache   . Wears dentures     Past Surgical History  Procedure Laterality Date  . Cholecystectomy    . Skin graft      Left leg  . Vein surgery    . Diagnostic laparoscopy    . Multiple tooth extractions    . Vascular surgery    . Eye surgery Bilateral     History reviewed. No pertinent family history. Social History:  reports that he has quit smoking. He does not have any smokeless tobacco history on file. He reports that he does not drink alcohol or use illicit drugs. Family history: unable  to obtain due to mental status Allergies:  Allergies  Allergen Reactions  . Penicillins Other (See Comments)    unknown  . Adhesive [Tape] Rash    Medications:  Scheduled: . antiseptic oral rinse  7 mL Mouth Rinse QID  . aspirin  81 mg Per Tube Daily  . chlorhexidine gluconate  15 mL Mouth Rinse BID  . dexamethasone  4 mg Intravenous 4 times per day   Or  . dexamethasone  4 mg Oral 4 times per day  . diphenhydrAMINE  25 mg Intravenous Q6H  . famotidine (PEPCID) IV  20 mg Intravenous Q24H  . folic acid  1 mg Oral Daily  . HYDROmorphone      . insulin aspart  0-15 Units Subcutaneous 6 times per day  . pantoprazole (PROTONIX) IV  40 mg Intravenous QHS  . rosuvastatin  10 mg Oral Daily  . sodium chloride  3 mL Intravenous Q12H  . vancomycin  1,000 mg Intravenous Q12H    ROS: unable to obtain due to mental status                                                                                                                                      History obtained from chart review  Physical exam:  Constitutional: well developed, intubated on the vent, off sedatives. Blood pressure 130/60, pulse 94, temperature 98.4 F (36.9 C), temperature source Oral, resp. rate 22, weight 85.276 kg (188 lb), SpO2 100 %. Eyes: no jaundice or exophthalmos.  Head: normocephalic. Neck: supple, no bruits, no JVD. Cardiac: no murmurs. Lungs: clear. Abdomen: soft, no tender, no mass. Extremities: no edema, clubbing, or cyanosis.  Skin: no rash Neurologic Examination:                                                                                                      General: Mental Status: Intubated on the vent, but able to open his eyes on commands and follow simple commands. Cranial Nerves: Pupils 4 mm bilaterally, reactive to light. Right gaze preference. No obvious face  weakness. Tongue: intubated. Motor: Significant left hemiparesis Tone: decreased in the left Sensory: reacts to pain Deep Tendon Reflexes:  1+ all over Plantars: Right: downgoing   Left: downgoing Cerebellar: Unable to test due to mental status Gait: Unable to test due to mental status    Results for orders placed or performed during the hospital encounter of 09/06/15 (from the past 48 hour(s))  Glucose, capillary     Status: Abnormal   Collection Time: 09/06/15  6:31 AM  Result Value Ref Range   Glucose-Capillary 122 (H) 65 - 99 mg/dL   Comment 1 Notify RN  Comment 2 Document in Chart   Draw ABG 1 hour after initiation of ventilator     Status: Abnormal   Collection Time: 09/06/15  4:00 PM  Result Value Ref Range   FIO2 0.30    LHR 16 resp/min   pH, Arterial 7.402 7.350 - 7.450   pCO2 arterial 40.1 35.0 - 45.0 mmHg   pO2, Arterial 103 (H) 80.0 - 100.0 mmHg   Bicarbonate 24.7 (H) 20.0 - 24.0 mEq/L   TCO2 26.0 0 - 100 mmol/L   Acid-Base Excess 0.4 0.0 - 2.0 mmol/L   O2 Saturation 98.1 %   Patient temperature 97.2    Collection site LEFT RADIAL    Drawn by [id number]    Sample type ARTERIAL DRAW    Allens test (pass/fail) PASS PASS  Lactic acid, plasma     Status: Abnormal   Collection Time: 09/06/15  4:25 PM  Result Value Ref Range   Lactic Acid, Venous 2.5 (HH) 0.5 - 2.0 mmol/L    Comment: CRITICAL RESULT CALLED TO, READ BACK BY AND VERIFIED WITH: S CORBERTSON,RN [id number] 09/06/2015 WBOND   Comprehensive metabolic panel     Status: Abnormal   Collection Time: 09/06/15  4:35 PM  Result Value Ref Range   Sodium 138 135 - 145 mmol/L   Potassium 4.0 3.5 - 5.1 mmol/L   Chloride 105 101 - 111 mmol/L   CO2 25 22 - 32 mmol/L   Glucose, Bld 205 (H) 65 - 99 mg/dL   BUN 11 6 - 20 mg/dL   Creatinine, Ser 1.04 0.61 - 1.24 mg/dL   Calcium 8.6 (L) 8.9 - 10.3 mg/dL   Total Protein 5.4 (L) 6.5 - 8.1 g/dL   Albumin 2.8 (L) 3.5 - 5.0 g/dL   AST 22 15 - 41 U/L   ALT 12 (L) 17 - 63  U/L   Alkaline Phosphatase 73 38 - 126 U/L   Total Bilirubin 1.3 (H) 0.3 - 1.2 mg/dL   GFR calc non Af Amer >60 >60 mL/min   GFR calc Af Amer >60 >60 mL/min    Comment: (NOTE) The eGFR has been calculated using the CKD EPI equation. This calculation has not been validated in all clinical situations. eGFR's persistently <60 mL/min signify possible Chronic Kidney Disease.    Anion gap 8 5 - 15  CBC     Status: Abnormal   Collection Time: 09/06/15  4:35 PM  Result Value Ref Range   WBC 12.5 (H) 4.0 - 10.5 K/uL   RBC 4.77 4.22 - 5.81 MIL/uL   Hemoglobin 14.6 13.0 - 17.0 g/dL   HCT 42.6 39.0 - 52.0 %   MCV 89.3 78.0 - 100.0 fL   MCH 30.6 26.0 - 34.0 pg   MCHC 34.3 30.0 - 36.0 g/dL   RDW 15.0 11.5 - 15.5 %   Platelets 96 (L) 150 - 400 K/uL    Comment: REPEATED TO VERIFY PLATELET COUNT CONFIRMED BY SMEAR   Troponin I     Status: None   Collection Time: 09/06/15  4:35 PM  Result Value Ref Range   Troponin I <0.03 <0.031 ng/mL    Comment:        NO INDICATION OF MYOCARDIAL INJURY.   Glucose, capillary     Status: Abnormal   Collection Time: 09/06/15  4:51 PM  Result Value Ref Range   Glucose-Capillary 188 (H) 65 - 99 mg/dL  Glucose, capillary     Status: Abnormal   Collection Time: 09/06/15  7:39  PM  Result Value Ref Range   Glucose-Capillary 161 (H) 65 - 99 mg/dL   Dg Cervical Spine 1 View  09/06/2015   CLINICAL DATA:  ACDF C2-3 and C4-5  EXAM: DG C-ARM 61-120 MIN; DG CERVICAL SPINE - 1 VIEW  COMPARISON:  None.  FLUOROSCOPY TIME:  10 seconds  One image  FINDINGS: Single lateral intraoperative fluoroscopic image is provided of the cervical spine. Anterior cervical fusion with interbody spacer device at C2-3 and C4-5.  IMPRESSION: ACDF C2-3 and C4-5.   Electronically Signed   By: Kathreen Devoid   On: 09/06/2015 12:43   Ct Head Wo Contrast  09/06/2015   CLINICAL DATA:  Left-sided weakness following cervical surgery  EXAM: CT HEAD WITHOUT CONTRAST  TECHNIQUE: Contiguous axial images  were obtained from the base of the skull through the vertex without intravenous contrast.  COMPARISON:  06/27/2013  FINDINGS: The bony calvarium is intact. No gross soft tissue abnormality is seen. Mild atrophic changes are noted. There is some generalized decreased attenuation identified in the distribution of the right middle cerebral artery suggestive of a early ischemic event. MRI may be helpful further evaluation. No other focal areas of ischemia or hemorrhage are noted.  IMPRESSION: Chronic atrophic changes.  Generalized decreased attenuation in the distribution of the right middle cerebral artery suggestive of early ischemia. MRI may be helpful for further evaluation.  These results were called by telephone at the time of interpretation on 09/06/2015 at 5:34 pm to [facility], the pts nurse, who verbally acknowledged these results.   Electronically Signed   By: Inez Catalina M.D.   On: 09/06/2015 17:37   Dg Lumbar Spine 1 View  09/06/2015   CLINICAL DATA:  Laminectomy.  EXAM: LUMBAR SPINE - 1 VIEW  COMPARISON:  None.  FINDINGS: Metallic patch that lumbar vertebra are numbered with the lowest lumbar shaped vertebra as L5. This may be a transitional vertebra. 11 mm anterolisthesis of L4 on L5 noted. Metallic markers noted posteriorly at the L4-L5 level  IMPRESSION: Metallic marker noted posteriorly at the L4-L5 level.   Electronically Signed   By: Marcello Moores  Register   On: 09/06/2015 13:28   Dg Chest Port 1 View  09/06/2015   CLINICAL DATA:  Intubation orogastric tube placement  EXAM: PORTABLE CHEST - 1 VIEW  COMPARISON:  08/01/2010  FINDINGS: Endotracheal tube is 3.9 cm above the carina. Orogastric tube crosses the gastroesophageal junction with the. Mild left base opacity suggesting small effusion. Heart size and vascular pattern normal. Right lung clear. Mild atelectasis left lung base.  IMPRESSION: Lines and tubes as described above.   Electronically Signed   By: Skipper Cliche M.D.   On: 09/06/2015 16:45    Dg C-arm 1-60 Min  09/06/2015   CLINICAL DATA:  ACDF C2-3 and C4-5  EXAM: DG C-ARM 61-120 MIN; DG CERVICAL SPINE - 1 VIEW  COMPARISON:  None.  FLUOROSCOPY TIME:  10 seconds  One image  FINDINGS: Single lateral intraoperative fluoroscopic image is provided of the cervical spine. Anterior cervical fusion with interbody spacer device at C2-3 and C4-5.  IMPRESSION: ACDF C2-3 and C4-5.   Electronically Signed   By: Kathreen Devoid   On: 09/06/2015 12:43    Assessment: 81 y.o. male with new onset right gaze preference with left hemiparesis, and CT brain with findings suggestive of acute ischemia involving right hemisphere. Concern for right MCA distribution infarct. Patient is not a candidate for thrombolysis or endovascular intervention due to unknown last seen well  and major surgery on 9/19. Stroke work up as delineated below. Stroke team will follow up tomorrow.   Stroke Risk Factors - age, HTN, DM, hypercholesterolemia  Plan: 1. HgbA1c, fasting lipid panel 2. MRI, MRA  of the brain without contrast 3. Echocardiogram 4. Carotid dopplers 5. Prophylactic therapy-aspirin 6. Risk factor modification 7. Telemetry monitoring 8. Frequent neuro checks 9. PT/OT SLP 10. NPO   Dorian Pod ,MD [facility] [phone]  09/07/2015, 12:14 AM

## 2015-09-07 NOTE — Progress Notes (Signed)
STROKE TEAM PROGRESS NOTE  HPI [name] is an 81 y.o. male with a past medical history significant for HTN, DM, hypercholesterolemia, PAD, GERD, who underwent C2-3 and C4-5 anterior cervical discectomy with interbody fusion as well as L3-4 decompressive laminectomy with bilateral L3 and L4 foraminotomies on 9/19, and few hours postoperatively was noted to have significant left hemiparesis that prompted CT brain which demonstrated findings concerning for acute ischemia in the right MCA territory. Neurology consulted for further evaluation. Patient reportedly hypotensive during surgery.  At this moment, he is intubated on the vent, able to follow commands, with a right gaze preference and dense left hemiparesis.  Date last known well: 09/06/15 Time last known well: unable to determine. tPA Given: no, major surgery 9/19, unknown time LKW      SUBJECTIVE (INTERVAL HISTORY) His RN is at the bedside.  Overall he feels his condition is stable.     OBJECTIVE Temp:  [96.4 F (35.8 C)-98.8 F (37.1 C)] 98.8 F (37.1 C) (09/20 0400) Pulse Rate:  [51-112] 112 (09/20 0600) Cardiac Rhythm:  [-] Normal sinus rhythm (09/19 1930) Resp:  [9-25] 24 (09/20 0600) BP: (74-146)/(44-95) 146/61 mmHg (09/20 0600) SpO2:  [98 %-100 %] 100 % (09/20 0600) FiO2 (%):  [30 %-40 %] 30 % (09/20 0341) Weight:  [89.2 kg (196 lb 10.4 oz)] 89.2 kg (196 lb 10.4 oz) (09/20 0600)     CBC:  Recent Labs Lab 09/02/15 1439 09/06/15 1635 09/07/15 0233  WBC 9.1 12.5* 10.2  NEUTROABS 6.2  --   --   HGB 15.6 14.6 11.9*  HCT 46.6 42.6 36.3*  MCV 90.8 89.3 89.9  PLT 143* 96* 99*    Basic Metabolic Panel:  Recent Labs Lab 09/06/15 1635 09/07/15 0233  NA 138 136  K 4.0 4.0  CL 105 104  CO2 25 24  GLUCOSE 205* 139*  BUN 11 14  CREATININE 1.04 1.05  CALCIUM 8.6* 8.0*  MG  --  1.3*  PHOS  --  2.8    Lipid Panel: No results found for: CHOL, TRIG, HDL, CHOLHDL, VLDL, LDLCALC HgbA1c:  Lab Results   Component Value Date   HGBA1C 6.1* 09/02/2015   Urine Drug Screen: No results found for: LABOPIA, COCAINSCRNUR, LABBENZ, AMPHETMU, THCU, LABBARB    IMAGING  Dg Cervical Spine 1 View 09/06/2015    ACDF C2-3 and C4-5  Ct Head Wo Contrast 09/06/2015   Chronic atrophic changes.  Generalized decreased attenuation in the distribution of the right middle cerebral artery suggestive of early ischemia. MRI may be helpful for further evaluation.      Dg Lumbar Spine 1 View 09/06/2015    Metallic marker noted posteriorly at the L4-L5 level.     Dg Chest Port 1 View 09/06/2015    Lines and tubes as described above.    Dg C-arm 1-60 Min 09/06/2015    ACDF C2-3 and C4-5.      PHYSICAL EXAM Elderly Caucasian male in mild distress. Has a cervical spine collar in place.intubated. . Afebrile. Head is nontraumatic. Neck is supple without bruit.    Cardiac exam no murmur or gallop. Lungs are clear to auscultation. Distal pulses are well felt. Neurological Exam :  Drowsy but opens eyes to commands. Right gaze preference but able to follow gaze to the left past midline. Blinks to threat on the right but not on the left. Pupils 3 mm irregular reactive. Conjunctivae reflexes are present. Left lower facial weakness. Tongue midline. Moves right upper and  lower extremity purposefully well against gravity without weakness. Left upper extremity is plegic. Left lower extremity can withdraw against gravity. Left hemi-neglect present. Decreased sensation on the left hemibody. Tone is decreased on the left. Left plantar upgoing right downgoing.  ASSESSMENT/PLAN [name] is a 81 y.o. male with history of HTN, DM, hypercholesterolemia, PAD, GERD, who underwent C2-3 and C4-5 anterior cervical discectomy with interbody fusion as well as L3-4 decompressive laminectomy with bilateral L3 and L4 foraminotomies on 9/19  presenting with left hemiparesis.  He did not receive IV t-PA due to recent  surgery.  Stroke:  Non-dominant infarct possibly embolic embolic from an unknown source.  Resultant left hemiparesis  MRI  pending  MRA  head and neck - pending  Head CT - Generalized decreased attenuation in the distribution of the right middle cerebral artery suggestive of early ischemia.  Carotid Doppler deferred dur to neck collar  2D EchoLeft ventricle: The cavity size was normal. There was moderate concentric hypertrophy. Systolic function was normal. The estimated ejection fraction was in the range of 60% to 65%. Wall motion was normal; there were no regional wall motion abnormalities. - Aortic valve: Moderate diffuse thickening and calcification, consistent with sclerosis.  LDL pending  HgbA1c pending  VTE prophylaxis SCDs  Diet NPO time specified  aspirin 81 mg orally every day prior to admission, now on aspirin 300 mg suppository daily  Ongoing aggressive stroke risk factor management  Therapy recommendations: Pending  Disposition: Pending  Hypertension  Stable  Permissive hypertension (OK if < 220/120) but gradually normalize in 5-7 days  Hyperlipidemia  Home meds: Crestor 10 mg daily  resumed in hospital  LDL pending, goal < 70  Continue statin at discharge  Diabetes  HgbA1c 6.1, goal < 7.0  Controlled  Other Stroke Risk Factors  Advanced age  Cigarette smoker, quit smoking    Other Active Problems  Low magnesium level  Low calcium level  Mild thrombocytopenia  Status post lumbar and cervical surgeries 09/06/2015  Hospital day # 1 I have personally examined this patient, reviewed notes, independently viewed imaging studies, participated in medical decision making and plan of care. I have made any additions or clarifications directly to the above note. Agree with note above. Patient has had a large right hemispheric infarct following recent cervical spine surgery etiology likely related to right carotid  injury/manipulation during the surgery. Plan to check MRI scan of the brain with MRA of the brain and neck to look for carotid dissection/thrombus. Patient unfortunately is not a candidate for anticoagulation at the present time given recent major spine surgery.  This patient is critically ill and at significant risk of neurological worsening, death and care requires constant monitoring of vital signs, hemodynamics,respiratory and cardiac monitoring, extensive review of multiple databases, frequent neurological assessment, discussion with family, other specialists and medical decision making of high complexity.I have made any additions or clarifications directly to the above note.This critical care time does not reflect procedure time, or teaching time or supervisory time of PA/NP/Med Resident etc but could involve care discussion time.  I spent 30 minutes of neurocritical care time  in the care of  this patient.   Delia Heady, MD Medical Director [facility] Pager: [phone] 09/07/2015 1:23 PM     To contact Stroke Continuity provider, please refer to [email or web address]. After hours, contact General Neurology

## 2015-09-07 NOTE — Progress Notes (Signed)
  Echocardiogram 2D Echocardiogram has been performed.  [name] 09/07/2015, 11:13 AM

## 2015-09-07 NOTE — Progress Notes (Signed)
Advanced ET tube 2 cm per MD order. 25 at lip.

## 2015-09-07 NOTE — Progress Notes (Signed)
Initial Nutrition Assessment  DOCUMENTATION CODES:   Not applicable  INTERVENTION:  If pt remains intubated for >/= 48 hours, recommend starting nutrition. Recommend Vital AF 1.2 formula @ 25 ml/hr via OGT and increase by 10 ml every 4 hours to goal rate of 65 ml/hr which provides 1872 kcal, 117 grams of protein, and 1264 ml of H2O.    RD to continue to monitor.   NUTRITION DIAGNOSIS:   Inadequate oral intake related to inability to eat as evidenced by NPO status.  GOAL:   Patient will meet greater than or equal to 90% of their needs  MONITOR:   Vent status, Weight trends, Labs, I & O's  REASON FOR ASSESSMENT:   Ventilator, Malnutrition Screening Tool    ASSESSMENT:   81 year old male with PMH DM, HTN, GERD who had a posterior approach laminectomy then post op patient was unresponsive with facial swelling and a rash so decision was made to leave patient intubated.   PROCEDURE: (9/19): Anterior Cervical Decompression Fusion Cervical two,Cervical three,Cervical four-,Cervical five (N/A) Lumbar three-four decompresssive laminectomy (N/A)  Patient is currently intubated on ventilator support MV: 10.3 L/min Temp (24hrs), Avg:98.3 F (36.8 C), Min:96.4 F (35.8 C), Max:100.1 F (37.8 C)  Propofol: none  Family member at bedside. He reports pt was eating well PTA with no other difficulties. Weight has been stable. Nutrition-Focused physical exam completed. Pt with no observed significant fat or muscle mass loss.   Labs and medications reviewed.   Diet Order:  Diet NPO time specified  Skin:   (Incision on back and neck, non-pitting LE edema)  Last BM:  PTA  Height:   Ht Readings from Last 1 Encounters:  09/02/15  (1.854 m)    Weight:   Wt Readings from Last 1 Encounters:  09/07/15 196 lb 10.4 oz (89.2 kg)    Ideal Body Weight:  83.6 kg  BMI:  Body mass index is 25.95 kg/(m^2).  Estimated Nutritional Needs:   Kcal:  1822  Protein:  110-120  grams  Fluid:  >/=1.8 L/day  EDUCATION NEEDS:   No education needs identified at this time  Roslyn Smiling, MS, RD, LDN Pager # [phone] After hours/ weekend pager # [phone]

## 2015-09-08 ENCOUNTER — Inpatient Hospital Stay (HOSPITAL_COMMUNITY): Payer: Medicare Other

## 2015-09-08 DIAGNOSIS — M4802 Spinal stenosis, cervical region: Secondary | ICD-10-CM

## 2015-09-08 LAB — HEMOGLOBIN A1C
HEMOGLOBIN A1C: 6.1 % — AB (ref 4.8–5.6)
MEAN PLASMA GLUCOSE: 128 mg/dL

## 2015-09-08 LAB — BASIC METABOLIC PANEL
Anion gap: 4 — ABNORMAL LOW (ref 5–15)
BUN: 22 mg/dL — AB (ref 6–20)
CALCIUM: 8 mg/dL — AB (ref 8.9–10.3)
CO2: 25 mmol/L (ref 22–32)
CREATININE: 1.08 mg/dL (ref 0.61–1.24)
Chloride: 107 mmol/L (ref 101–111)
GFR calc Af Amer: >60 mL/min (ref >60)
GFR calc non Af Amer: >60 mL/min (ref >60)
GLUCOSE: 144 mg/dL — AB (ref 65–99)
Potassium: 4 mmol/L (ref 3.5–5.1)
Sodium: 136 mmol/L (ref 135–145)

## 2015-09-08 LAB — LIPID PANEL
Cholesterol: 76 mg/dL (ref 0–200)
HDL: 24 mg/dL — ABNORMAL LOW (ref >40)
LDL CALC: 41 mg/dL (ref 0–99)
Total CHOL/HDL Ratio: 3.2 RATIO
Triglycerides: 53 mg/dL (ref <150)
VLDL: 11 mg/dL (ref 0–40)

## 2015-09-08 LAB — GLUCOSE, CAPILLARY
GLUCOSE-CAPILLARY: 110 mg/dL — AB (ref 65–99)
GLUCOSE-CAPILLARY: 116 mg/dL — AB (ref 65–99)
GLUCOSE-CAPILLARY: 147 mg/dL — AB (ref 65–99)
Glucose-Capillary: 200 mg/dL — ABNORMAL HIGH (ref 65–99)
Glucose-Capillary: 117 mg/dL — ABNORMAL HIGH (ref 65–99)
Glucose-Capillary: 140 mg/dL — ABNORMAL HIGH (ref 65–99)
Glucose-Capillary: 128 mg/dL — ABNORMAL HIGH (ref 65–99)
Glucose-Capillary: 111 mg/dL — ABNORMAL HIGH (ref 65–99)

## 2015-09-08 LAB — CBC
HEMATOCRIT: 32 % — AB (ref 39.0–52.0)
Hemoglobin: 10.7 g/dL — ABNORMAL LOW (ref 13.0–17.0)
MCH: 29.8 pg (ref 26.0–34.0)
MCHC: 33.4 g/dL (ref 30.0–36.0)
MCV: 89.1 fL (ref 78.0–100.0)
Platelets: 92 10*3/uL — ABNORMAL LOW (ref 150–400)
RBC: 3.59 MIL/uL — ABNORMAL LOW (ref 4.22–5.81)
RDW: 15.3 % (ref 11.5–15.5)
WBC: 8.5 10*3/uL (ref 4.0–10.5)

## 2015-09-08 MED ORDER — GADOBENATE DIMEGLUMINE 529 MG/ML IV SOLN
20.0000 mL | Freq: Once | INTRAVENOUS | Status: AC
  Administered 2015-09-08: 20 mL via INTRAVENOUS

## 2015-09-08 NOTE — Progress Notes (Signed)
~  0315: Called MRI to discuss possible schedule. States that MRI has received multiple stat orders t/o shift, unsure if pt will be able to get done this shift. Will call back with update. ~0540: MRI called, discussed possible times to get pt down. Decided to allow dayshift staff to call and discuss possible times after they get in.

## 2015-09-08 NOTE — Procedures (Signed)
Extubation Procedure Note  Patient Details:   Name: DEONTA BOMBERGER DOB: 04-Aug-1930 MRN: [id number]   Airway Documentation:     Evaluation  O2 sats: stable throughout Complications: No apparent complications Patient did tolerate procedure well. Bilateral Breath Sounds: Clear Suctioning: Airway Yes  Vitals stable throughout, positive cuff leak. Nurse at bedside.  [place] R Groves 09/08/2015, 2:22 PM

## 2015-09-08 NOTE — Progress Notes (Signed)
Transported pt successfully on vent to MRI. Stable throughout.

## 2015-09-08 NOTE — Progress Notes (Signed)
PULMONARY / CRITICAL CARE MEDICINE   Name: [name] MRN: [id number] DOB: Sep 29, 1930    ADMISSION DATE:  09/06/2015 CONSULTATION DATE:  09/06/15  REFERRING MD :  Jordan Likes  CHIEF COMPLAINT:  UE and LE numbness  INITIAL PRESENTATION:  81 y.o. M taken to OR 9/19 with Dr. Jordan Likes of neurosurgery for anterior discectomy / fusion and lumbar laminectomy.  Post op, remained on the vent.  PCCM called for vent management.    STUDIES:  CT head (9/19)- Suggestive of rt MCA infarct.  SIGNIFICANT EVENTS: 9/19 - to OR for C2-C3 and C4-5 anterior anterior cervical discectomy with interbody fusion, anterior instrumentation at C2-3 and C4-5, L3-4 decompressive laminectomy with bilateral L3 and l4 foraminotomies.  Returned to ICU on vent.   HISTORY OF PRESENT ILLNESS:  Pt is encephalopathic; therefore, this HPI is obtained from chart review. [name] is a 81 y.o. M with PMH as outlined below.  He has had progressive bilateral upper extremity and lower extremity numbness for which he was evaluated by Dr. Jordan Likes of neurosurgery.  Workup demonstrated evidence of critical stenosis at C2-3 and C4-5 as well as lumbar stenosis at L3-4 and L4-5.  On 9/19, he was taken to the OR for C2-C3 and C4-5 anterior anterior cervical discectomy with interbody fusion, anterior instrumentation at C2-3 and C4-5, L3-4 decompressive laminectomy with bilateral L3 and l4 foraminotomies.  Following the procedure, he remained on the ventilator and PCCM was consulted for vent management.  Of note, pt was in prone positioning for part of the procedure and upon OR staff turning him supine, they noted some facial and possible tongue edema.  There was a concern for possible angioedema.  In addition, neurosurgery had mentioned that they were concerned for new CVA given that pt was not moving left leg.  PAST MEDICAL HISTORY :   has a past medical history of Hypertension; Diabetes mellitus without complication; DDD (degenerative disc  disease); Vertigo; High cholesterol; Peripheral vascular disease; Pneumonia; History of kidney stones; gallstones; GERD (gastroesophageal reflux disease); Headache; and Wears dentures.  has past surgical history that includes Cholecystectomy; Skin graft; Vein Surgery; Diagnostic laparoscopy; Multiple tooth extractions; Vascular surgery; Eye surgery (Bilateral); Anterior cervical decomp/discectomy fusion (N/A, 09/06/2015); and Lumbar laminectomy/decompression microdiscectomy (N/A, 09/06/2015). Prior to Admission medications   Medication Sig Start Date End Date Taking? Authorizing Provider  aspirin EC 81 MG tablet Take 81 mg by mouth daily.   Yes Historical Provider, MD  folic acid (FOLVITE) 1 MG tablet Take 1 mg by mouth daily.  06/01/15  Yes Historical Provider, MD  hydrochlorothiazide (HYDRODIURIL) 25 MG tablet Take 25 mg by mouth daily as needed (for leg swelling).  07/14/15  Yes Historical Provider, MD  metFORMIN (GLUCOPHAGE) 500 MG tablet Take 500 mg by mouth 2 (two) times daily.   Yes Historical Provider, MD  methotrexate 2.5 MG tablet Take 20 mg by mouth every Saturday. Takes every saturday   Yes Historical Provider, MD  propranolol (INDERAL) 80 MG tablet Take 80 mg by mouth daily.   Yes Historical Provider, MD  rosuvastatin (CRESTOR) 10 MG tablet Take 10 mg by mouth daily.   Yes Historical Provider, MD  telmisartan (MICARDIS) 80 MG tablet Take 80 mg by mouth daily.   Yes Historical Provider, MD  oxyCODONE-acetaminophen (PERCOCET/ROXICET) 5-325 MG per tablet Take 1 tablet by mouth every 4 (four) hours as needed for pain.    Historical Provider, MD   Allergies  Allergen Reactions  . Penicillins Other (See Comments)  unknown  . Adhesive [Tape] Rash    FAMILY HISTORY:  History reviewed. No pertinent family history.  SOCIAL HISTORY:  reports that he has quit smoking. He does not have any smokeless tobacco history on file. He reports that he does not drink alcohol or use illicit  drugs.  REVIEW OF SYSTEMS:  Unable to obtain as pt is encephalopathic.  SUBJECTIVE: Doing well on a weaning trial.   VITAL SIGNS: Temp:  [98.7 F (37.1 C)-102 F (38.9 C)] 99.1 F (37.3 C) (09/21 0400) Pulse Rate:  [62-131] 93 (09/21 0739) Resp:  [13-31] 30 (09/21 0739) BP: (101-167)/(45-82) 129/54 mmHg (09/21 0739) SpO2:  [95 %-100 %] 100 % (09/21 0739) FiO2 (%):  [30 %] 30 % (09/21 0739) Weight:  [199 lb 15.3 oz (90.7 kg)] 199 lb 15.3 oz (90.7 kg) (09/21 0400) HEMODYNAMICS:   VENTILATOR SETTINGS: Vent Mode:  [-] PSV;CPAP FiO2 (%):  [30 %] 30 % Set Rate:  [16 bmp] 16 bmp Vt Set:  [550 mL] 550 mL PEEP:  [5 cmH20] 5 cmH20 Pressure Support:  [5 cmH20] 5 cmH20 Plateau Pressure:  [22 cmH20-25 cmH20] 23 cmH20 INTAKE / OUTPUT: Intake/Output      09/20 0701 - 09/21 0700 09/21 0701 - 09/22 0700   I.V. (mL/kg) 3000 (33.1)    IV Piggyback 450    Total Intake(mL/kg) 3450 (38)    Urine (mL/kg/hr) 1360 (0.6)    Drains     Blood     Total Output 1360     Net +2090            PHYSICAL EXAMINATION: General: Adult male, in no distress Neuro: Awake, Responds to commands. Cannot move left side. HEENT: PERRLA, EOMI.. No significant edema of tongue noted Cardiovascular: RRR, no M/R/G.  Lungs: Respirations even and unlabored.  CTA bilaterally, No W/R/R. Abdomen: BS x 4, soft, NT/ND.  Musculoskeletal: No gross deformities, no edema.  Skin: Intact, warm, no rashes.  LABS:  CBC  Recent Labs Lab 09/06/15 1635 09/07/15 0233 09/08/15 0236  WBC 12.5* 10.2 8.5  HGB 14.6 11.9* 10.7*  HCT 42.6 36.3* 32.0*  PLT 96* 99* 92*   Coag's No results for input(s): APTT, INR in the last 168 hours. BMET  Recent Labs Lab 09/06/15 1635 09/07/15 0233 09/08/15 0236  NA 138 136 136  K 4.0 4.0 4.0  CL 105 104 107  CO2 BUN 11 14 22*  CREATININE 1.04 1.05 1.08  GLUCOSE 205* 139* 144*   Electrolytes  Recent Labs Lab 09/06/15 1635 09/07/15 0233 09/08/15 0236  CALCIUM  8.6* 8.0* 8.0*  MG  --  1.3*  --   PHOS  --  2.8  --    Sepsis Markers  Recent Labs Lab 09/06/15 1625  LATICACIDVEN 2.5*   ABG  Recent Labs Lab 09/06/15 1600  PHART 7.402  PCO2ART 40.1  PO2ART 103*   Liver Enzymes  Recent Labs Lab 09/06/15 1635  AST 22  ALT 12*  ALKPHOS 73  BILITOT 1.3*  ALBUMIN 2.8*   Cardiac Enzymes  Recent Labs Lab 09/06/15 1635  TROPONINI <0.03   Glucose  Recent Labs Lab 09/07/15 0737 09/07/15 1156 09/07/15 1608 09/07/15 2002 09/08/15 0003 09/08/15 0412  GLUCAP 146* 151* 124* 130* 110* 117*    Imaging No results found.  ASSESSMENT / PLAN:  NEUROLOGIC A:   Acute metabolic encephalopathy. Critical stenosis at C2-3 and C4-5 as well as lumbar stenosis at L3-4 and L4-5 - s/p C2-C3 and C4-5 anterior  anterior cervical discectomy with interbody fusion, anterior instrumentation at C2-3 and C4-5, L3-4 decompressive laminectomy with bilateral L3 and l4 foraminotomies (09/06/15 by Dr. Jordan Likes). Concern for new infarct per neurosurgery. Hx DDD, vertigo, headaches. P:   Sedation:  Fentanyl PRN / Midazolam PRN. RASS goal: 0 to -1. Daily WUA.  Post op care per neurosurgery. D/c dilaudid. Hold outapatient percocet.  PULMONARY OETT 9/19 >>> A: VDRF following surgery 9/19. ? Angioedema after surgery P:   Full mechanical support, wean as able. VAP prevention measures. Continue decadron and H1, H2 blockers. Doing well on SBT. Plan on extubation after MRI/MRA of brain.  CARDIOVASCULAR A:  Hypotension post op. Hx HTN, HLD, PVD. P:  Responded to fluid bolus.  RENAL A:   No acute issues - no labs since 9/15. P:   NS @ 150. Correct electrolytes as indicated.  GASTROINTESTINAL A:   GERD. Nutrition. P:  SUP: Pantoprazole. NPO. Tube feeds later today if not extubated.   HEMATOLOGIC A:   VTE Prophylaxis. P:  SCD's. CBC now and in AM.  INFECTIOUS A:   Surgical prophylaxis. P:   Monitor clinically.  ENDOCRINE A:    DM. P:   SSI. Hold outpatient metformin.  Family updated: None.  Interdisciplinary Family Meeting v Palliative Care Meeting:  Due by: 9/25.  Critical care time- 35 mins.  Chilton Greathouse MD  Pulmonary and Critical Care Pager [phone] If no answer or after 3pm call: [phone] 09/08/2015, 7:56 AM

## 2015-09-08 NOTE — Progress Notes (Signed)
STROKE TEAM PROGRESS NOTE  HPI [name] is an 81 y.o. male with a past medical history significant for HTN, DM, hypercholesterolemia, PAD, GERD, who underwent C2-3 and C4-5 anterior cervical discectomy with interbody fusion as well as L3-4 decompressive laminectomy with bilateral L3 and L4 foraminotomies on 9/19, and few hours postoperatively was noted to have significant left hemiparesis that prompted CT brain which demonstrated findings concerning for acute ischemia in the right MCA territory. Neurology consulted for further evaluation. Patient reportedly hypotensive during surgery.  At this moment, he is intubated on the vent, able to follow commands, with a right gaze preference and dense left hemiparesis.  Date last known well: 09/06/15 Time last known well: unable to determine. tPA Given: no, major surgery 9/19, unknown time LKW      SUBJECTIVE (INTERVAL HISTORY) His RN and nephew are at the bedside.  Overall he feels his condition is stable.  He was extubated this morning and is breathing fine. MRI scan of the brain completed this morning shows a moderate-sized right MCA infarct. MRA of the neck shows 60% proximal right ICA origin stenosis. He continues to have left hemiplegia and neglect   OBJECTIVE Temp:  [98.3 F (36.8 C)-102 F (38.9 C)] 98.3 F (36.8 C) (09/21 1152) Pulse Rate:  [32-131] 100 (09/21 1300) Cardiac Rhythm:  [-] Normal sinus rhythm (09/21 0800) Resp:  [16-31] 31 (09/21 1300) BP: (101-170)/(45-81) 170/81 mmHg (09/21 1300) SpO2:  [64 %-100 %] 98 % (09/21 1300) FiO2 (%):  [30 %] 30 % (09/21 1300) Weight:  [199 lb 15.3 oz (90.7 kg)] 199 lb 15.3 oz (90.7 kg) (09/21 0400)     CBC:  Recent Labs Lab 09/02/15 1439  09/07/15 0233 09/08/15 0236  WBC 9.1  < > 10.2 8.5  NEUTROABS 6.2  --   --   --   HGB 15.6  < > 11.9* 10.7*  HCT 46.6  < > 36.3* 32.0*  MCV 90.8  < > 89.9 89.1  PLT 143*  < > 99* 92*  < > = values in this interval not  displayed.  Basic Metabolic Panel:   Recent Labs Lab 09/07/15 0233 09/08/15 0236  NA 136 136  K 4.0 4.0  CL 104 107  CO2 24 25  GLUCOSE 139* 144*  BUN 14 22*  CREATININE 1.05 1.08  CALCIUM 8.0* 8.0*  MG 1.3*  --   PHOS 2.8  --     Lipid Panel:     Component Value Date/Time   CHOL 76 09/08/2015 0236   TRIG 53 09/08/2015 0236   HDL 24* 09/08/2015 0236   CHOLHDL 3.2 09/08/2015 0236   VLDL 11 09/08/2015 0236   LDLCALC 41 09/08/2015 0236   HgbA1c:  Lab Results  Component Value Date   HGBA1C 6.1* 09/07/2015   Urine Drug Screen: No results found for: LABOPIA, COCAINSCRNUR, LABBENZ, AMPHETMU, THCU, LABBARB    IMAGING  Dg Cervical Spine 1 View 09/06/2015    ACDF C2-3 and C4-5  Ct Head Wo Contrast 09/06/2015   Chronic atrophic changes.  Generalized decreased attenuation in the distribution of the right middle cerebral artery suggestive of early ischemia. MRI may be helpful for further evaluation.      Dg Lumbar Spine 1 View 09/06/2015    Metallic marker noted posteriorly at the L4-L5 level.     Dg Chest Port 1 View 09/06/2015    Lines and tubes as described above.    Dg C-arm 1-60 Min 09/06/2015    ACDF  C2-3 and C4-5.      PHYSICAL EXAM Elderly Caucasian male in mild distress.   . Afebrile. Head is nontraumatic. Neck is supple without bruit.    Cardiac exam no murmur or gallop. Lungs are clear to auscultation. Distal pulses are well felt. Neurological Exam :  Awake alert. Speech is soft but clear and no dysarthria Right gaze preference but able to follow gaze to the left past midline. Blinks to threat on the right but not on the left. Pupils 3 mm irregular reactive. Corneal reflexes are present. Left lower facial weakness. Tongue midline. Moves right upper and lower extremity purposefully well against gravity without weakness. Left upper extremity is plegic. Left lower extremity can withdraw against gravity. Left hemi-neglect present. Unable to recognize his own  left hand Decreased sensation on the left hemibody. Tone is decreased on the left. Left plantar upgoing right downgoing.  ASSESSMENT/PLAN Mr. HABEEB PUERTAS is a 81 y.o. male with history of HTN, DM, hypercholesterolemia, PAD, GERD, who underwent C2-3 and C4-5 anterior cervical discectomy with interbody fusion as well as L3-4 decompressive laminectomy with bilateral L3 and L4 foraminotomies on 9/19  presenting with left hemiparesis.  He did not receive IV t-PA due to recent surgery.  Stroke:  Scattered right MCA, right PCA as well as left MCA and right superior cerebellar artery infarcts of embolic etiology without definite identified source  .  Resultant left hemiparesis, left hemi-neglect and left gaze palsy MRI   Large right MCA and partial right PCA infarcts with mild petechial hemorrhage, cytotoxic edema, and mild intracranial mass effect with leftward midline shift of 3 mm. Scattered small left MCA and occasional right SCA acute infarcts  MRA  head and neck - pending  Head CT - Generalized decreased attenuation in the distribution of the right middle cerebral artery suggestive of early ischemia.  Carotid Doppler  pending  2D EchoLeft ventricle: The cavity size was normal. There was moderate concentric hypertrophy. Systolic function was normal. The estimated ejection fraction was in the range of 60% to 65%. Wall motion was normal; there were no regional wall motion abnormalities. - Aortic valve: Moderate diffuse thickening and calcification, consistent with sclerosis.  LDL 41 mg%  HgbA1c 6.1 %  VTE prophylaxis SCDs Diet NPO time specified  aspirin 81 mg orally every day prior to admission, now on aspirin 300 mg suppository daily  Ongoing aggressive stroke risk factor management  Therapy recommendations: Pending  Disposition: Pending  Hypertension  Stable  Permissive hypertension (OK if < 220/120) but gradually normalize in 5-7  days  Hyperlipidemia  Home meds: Crestor 10 mg daily  resumed in hospital  LDL 41, goal < 70  Continue statin at discharge  Diabetes  HgbA1c 6.1, goal < 7.0  Controlled  Other Stroke Risk Factors  Advanced age  Cigarette smoker, quit smoking    Other Active Problems  Low magnesium level  Low calcium level  Mild thrombocytopenia  Status post lumbar and cervical surgeries 09/06/2015  Hospital day # 2 I have personally examined this patient, reviewed notes, independently viewed imaging studies, participated in medical decision making and plan of care. I have made any additions or clarifications directly to the above note. Agree with note above. Patient has had a large right hemispheric infarct following recent cervical spine surgery etiology likely likely embolic given strokes in multiple vascular distributions..  Patient unfortunately is not a candidate for anticoagulation at the present time given recent major spine surgery. Hence we will not pursue further  workup with TEE and loop recorder at the present time This patient is critically ill and at significant risk of neurological worsening, death and care requires constant monitoring of vital signs,. Discussed with patient and family and answered questions hemodynamics,respiratory and cardiac monitoring, extensive review of multiple databases, frequent neurological assessment, discussion with family, other specialists and medical decision making of high complexity.I have made any additions or clarifications directly to the above note.This critical care time does not reflect procedure time, or teaching time or supervisory time of PA/NP/Med Resident etc but could involve care discussion time.  I spent 30 minutes of neurocritical care time  in the care of  this patient.   Delia Heady, MD Medical Director [facility] Pager: [phone] 09/08/2015 4:05 PM     To contact Stroke Continuity provider, please refer  to [email or web address]. After hours, contact General Neurology

## 2015-09-08 NOTE — Progress Notes (Signed)
Postop day 2. Remains intubated and sedated on ventilator. MRI scan of brain still pending.  Patient will awaken to voice. Appears aware. Follows commands with right side readily. Dense left hemiparesis unchanged. Dressings clean and dry.  Perioperative right hemispheric stroke. MRI scan for day. Wean them to extubation once MRI scan done area and rehabilitation efforts when extubated.

## 2015-09-09 ENCOUNTER — Inpatient Hospital Stay (HOSPITAL_COMMUNITY): Payer: Medicare Other

## 2015-09-09 LAB — BASIC METABOLIC PANEL
Anion gap: 3 — ABNORMAL LOW (ref 5–15)
BUN: 20 mg/dL (ref 6–20)
CO2: 26 mmol/L (ref 22–32)
CREATININE: 0.82 mg/dL (ref 0.61–1.24)
Calcium: 8.1 mg/dL — ABNORMAL LOW (ref 8.9–10.3)
Chloride: 109 mmol/L (ref 101–111)
GFR calc Af Amer: >60 mL/min (ref >60)
GLUCOSE: 129 mg/dL — AB (ref 65–99)
POTASSIUM: 3.8 mmol/L (ref 3.5–5.1)
Sodium: 138 mmol/L (ref 135–145)

## 2015-09-09 LAB — VANCOMYCIN, TROUGH: VANCOMYCIN TR: 24 ug/mL — AB (ref 10.0–20.0)

## 2015-09-09 LAB — CBC
HEMATOCRIT: 32.8 % — AB (ref 39.0–52.0)
HEMOGLOBIN: 11.1 g/dL — AB (ref 13.0–17.0)
MCH: 30 pg (ref 26.0–34.0)
MCHC: 33.8 g/dL (ref 30.0–36.0)
MCV: 88.6 fL (ref 78.0–100.0)
PLATELETS: 83 10*3/uL — AB (ref 150–400)
RBC: 3.7 MIL/uL — ABNORMAL LOW (ref 4.22–5.81)
RDW: 14.8 % (ref 11.5–15.5)
WBC: 6.3 10*3/uL (ref 4.0–10.5)

## 2015-09-09 LAB — GLUCOSE, CAPILLARY
GLUCOSE-CAPILLARY: 147 mg/dL — AB (ref 65–99)
Glucose-Capillary: 133 mg/dL — ABNORMAL HIGH (ref 65–99)
Glucose-Capillary: 137 mg/dL — ABNORMAL HIGH (ref 65–99)
Glucose-Capillary: 121 mg/dL — ABNORMAL HIGH (ref 65–99)
Glucose-Capillary: 119 mg/dL — ABNORMAL HIGH (ref 65–99)

## 2015-09-09 MED ORDER — VANCOMYCIN HCL 10 G IV SOLR
1250.0000 mg | INTRAVENOUS | Status: DC
  Administered 2015-09-10: 1250 mg via INTRAVENOUS
  Filled 2015-09-09 (×2): qty 1250

## 2015-09-09 NOTE — Evaluation (Signed)
Physical Therapy Evaluation Patient Details Name: [name] MRN: [id number] DOB: 1930/06/17 Today's Date: 09/09/2015   History of Present Illness  pt is an 81 y/o male admitted with servere stenosis in cervical and lumbar areas with assoc bil UE and LE's, s/p C23 and C45 fusions and decompressions AND one level lumbar lami and foraminectomy.  After sx noted pt with significant L hemiparesis with some inattension and R gaze preference.  MRI shows R large MCA and smaller PCA infarcts; ?L infarct  Clinical Impression  Pt admitted with/for cervical/lumbar surgeries which were complicated by stroke with L hemiparesis.  Pt currently limited functionally due to the problems listed. ( See problems list.)   Pt will benefit from PT to maximize function and safety in order to get ready for next venue listed below.     Follow Up Recommendations CIR    Equipment Recommendations  Other (comment) (TBA further)    Recommendations for Other Services Rehab consult     Precautions / Restrictions Precautions Precautions: Fall      Mobility  Bed Mobility Overal bed mobility: Needs Assistance Bed Mobility: Rolling;Sidelying to Sit;Sit to Sidelying Rolling: Max assist Sidelying to sit: Max assist     Sit to sidelying: Max assist General bed mobility comments: cues and physical assist throughout the movement  Transfers Overall transfer level: Needs assistance                  Ambulation/Gait                Stairs            Wheelchair Mobility    Modified Rankin (Stroke Patients Only) Modified Rankin (Stroke Patients Only) Pre-Morbid Rankin Score: Moderate disability Modified Rankin: Severe disability     Balance Overall balance assessment: Needs assistance Sitting-balance support: Single extremity supported;Feet supported Sitting balance-Leahy Scale: Poor Sitting balance - Comments: uncoordinated/ataxic truncal movements/corrections.  Tends to fall  posteriorly without assist                                     Pertinent Vitals/Pain Pain Assessment: Faces Faces Pain Scale: Hurts little more Pain Location: neck Pain Descriptors / Indicators: Grimacing;Guarding Pain Intervention(s): Limited activity within patient's tolerance;Monitored during session;Repositioned    Home Living Family/patient expects to be discharged to:: Private residence Living Arrangements: Alone;Non-relatives/Friends Available Help at Discharge: Family;Friend(s);Available PRN/intermittently (alone ?5-6 hours per day.) Type of Home: House       Home Layout: One level Home Equipment: None      Prior Function Level of Independence: Independent with assistive device(s)               Hand Dominance        Extremity/Trunk Assessment   Upper Extremity Assessment:  (L UE Brunstrum 1, weak shrup, some adduction)           Lower Extremity Assessment: LLE deficits/detail   LLE Deficits / Details: some movement in synergy, gross extension 3-/5     Communication   Communication: No difficulties (watery voice)  Cognition Arousal/Alertness: Awake/alert Behavior During Therapy: Flat affect Overall Cognitive Status: Impaired/Different from baseline Area of Impairment: Attention;Following commands;Safety/judgement;Awareness;Orientation Orientation Level: Situation;Time Current Attention Level: Sustained   Following Commands: Follows one step commands with increased time Safety/Judgement: Decreased awareness of safety Awareness: Intellectual   General Comments: Unable to help pt realize that "Chanetta Marshall" is not in the room with [place].  General Comments General comments (skin integrity, edema, etc.): Right gaze, but can track weakly L across midline and turn his head to focus on objects left.  Mild inattension Left side.  VSS.  Not managing secretions and phlegm well.    Exercises        Assessment/Plan    PT Assessment Patient  needs continued PT services  PT Diagnosis Generalized weakness;Acute pain (L hemiparesis)   PT Problem List Decreased strength;Decreased activity tolerance;Decreased balance;Decreased mobility;Decreased coordination;Decreased knowledge of use of DME;Decreased knowledge of precautions;Pain  PT Treatment Interventions DME instruction;Functional mobility training;Therapeutic activities;Balance training;Neuromuscular re-education;Patient/family education   PT Goals (Current goals can be found in the Care Plan section) Acute Rehab PT Goals Patient Stated Goal: no goals stated PT Goal Formulation: With patient Time For Goal Achievement: 09/23/15 Potential to Achieve Goals: Good    Frequency Min 5X/week   Barriers to discharge        Co-evaluation               End of Session Equipment Utilized During Treatment: Oxygen Activity Tolerance: Patient tolerated treatment well;Patient limited by pain;Patient limited by fatigue Patient left: in bed;with call bell/phone within reach Nurse Communication: Mobility status         Time: 9604-5409 PT Time Calculation (min) (ACUTE ONLY): 41 min   Charges:   PT Evaluation $Initial PT Evaluation Tier I: 1 Procedure PT Treatments $Therapeutic Activity: 23-37 mins   PT G Codes:        Mottinger, Eliseo Gum 09/09/2015, 3:44 PM 09/09/2015  Spencer Bing, PT [phone]  (pager)

## 2015-09-09 NOTE — Progress Notes (Signed)
Postoperative day 3. Patient tolerating extubation well. Awakens easily. Answer simple questions. Denies pain. Patient still with dense left hemiparesis. Right upper and right lower extremity strength good. Wounds clean and dry.  MRI scan demonstrates large right hemispheric ischemic insult which is probably embolic in nature. Continue current supportive care. Begin to mobilize.

## 2015-09-09 NOTE — Progress Notes (Signed)
ANTIBIOTIC CONSULT NOTE - Follow up Pharmacy Consult for vancomycin Indication: surgical prophylaxis  Allergies  Allergen Reactions  . Penicillins Other (See Comments)    unknown  . Adhesive [Tape] Rash    Patient Measurements: Weight: 199 lb 15.3 oz (90.7 kg)   Vital Signs: Temp: 99 F (37.2 C) (09/22 1600) BP: 164/84 mmHg (09/22 1900) Pulse Rate: 84 (09/22 1900) Intake/Output from previous day: 09/21 0701 - 09/22 0700 In: 3900 [I.V.:3450; IV Piggyback:450] Out: 2650 [Urine:2650] Intake/Output from this shift: Total I/O In: 75 [I.V.:75] Out: 400 [Urine:400]  Labs:  Recent Labs  09/07/15 0233 09/08/15 0236 09/09/15 0242  WBC 10.2 8.5 6.3  HGB 11.9* 10.7* 11.1*  PLT 99* 92* 83*  CREATININE 1.05 1.08 0.82   Estimated Creatinine Clearance: 77.1 mL/min (by C-G formula based on Cr of 0.82).  Recent Labs  09/09/15 1843  VANCOTROUGH 24*     Microbiology: Recent Results (from the past 720 hour(s))  Surgical pcr screen     Status: None   Collection Time: 09/02/15  2:38 PM  Result Value Ref Range Status   MRSA, PCR NEGATIVE NEGATIVE Final   Staphylococcus aureus NEGATIVE NEGATIVE Final    Comment:        The Xpert SA Assay (FDA approved for NASAL specimens in patients over 24 years of age), is one component of a comprehensive surveillance program.  Test performance has been validated by [facility] for patients greater than or equal to 65 year old. It is not intended to diagnose infection nor to guide or monitor treatment.     Medical History: Past Medical History  Diagnosis Date  . Hypertension   . Diabetes mellitus without complication   . DDD (degenerative disc disease)   . Vertigo   . High cholesterol   . Peripheral vascular disease   . Pneumonia   . History of kidney stones   . Hx of gallstones   . GERD (gastroesophageal reflux disease)   . Headache   . Wears dentures     Assessment: 81 YOM s/p disectomy and fusion with drain  placement.  SCr 0.82, CrCl ~70 mL/min. Vancomycin Trough = 24 mcg/ml on Vanc 1gm q12h, goal 10-15 for surgical ppx.  Already received 6pm dose tonight. I will decrease vanc to 1250 mg IV q24h starting 6pm 9/23.   Goal of Therapy:  Vancomycin trough = 10-15 mcg/ml for surgical prophylaxis  Plan:  - Will decrease vanc to 1250 mg IV q24h starting 6pm 9/23. -follow renal function, clinical progression, LOT  Noah Delaine, RPh Clinical Pharmacist Pager: [phone] 09/09/2015 8:26 PM

## 2015-09-09 NOTE — Progress Notes (Signed)
Rehab Admissions Coordinator Note:  Patient was screened by Trish Mage for appropriateness for an Inpatient Acute Rehab Consult.  At this time, we are recommending Inpatient Rehab consult.  Trish Mage 09/09/2015, 4:19 PM  I can be reached at [phone].

## 2015-09-09 NOTE — Progress Notes (Signed)
PULMONARY / CRITICAL CARE MEDICINE   Name: [name] MRN: [id number] DOB: 1930-08-29    ADMISSION DATE:  09/06/2015 CONSULTATION DATE:  09/06/15  REFERRING MD :  Jordan Likes  CHIEF COMPLAINT:  UE and LE numbness  INITIAL PRESENTATION:  81 y.o. M taken to OR 9/19 with Dr. Jordan Likes of neurosurgery for anterior discectomy / fusion and lumbar laminectomy.  Post op, remained on the vent.  PCCM called for vent management.    STUDIES:  CT head (9/19)- Suggestive of rt MCA infarct. MRU (9/21)- Large MCA and small PCA infarct.  SIGNIFICANT EVENTS: 9/19 - to OR for C2-C3 and C4-5 anterior anterior cervical discectomy with interbody fusion, anterior instrumentation at C2-3 and C4-5, L3-4 decompressive laminectomy with bilateral L3 and l4 foraminotomies.  Returned to ICU on vent. 9/21- Extubated.  HISTORY OF PRESENT ILLNESS:  Pt is encephalopathic; therefore, this HPI is obtained from chart review. [name] is a 81 y.o. M with PMH as outlined below.  He has had progressive bilateral upper extremity and lower extremity numbness for which he was evaluated by Dr. Jordan Likes of neurosurgery.  Workup demonstrated evidence of critical stenosis at C2-3 and C4-5 as well as lumbar stenosis at L3-4 and L4-5.  On 9/19, he was taken to the OR for C2-C3 and C4-5 anterior anterior cervical discectomy with interbody fusion, anterior instrumentation at C2-3 and C4-5, L3-4 decompressive laminectomy with bilateral L3 and l4 foraminotomies.  Following the procedure, he remained on the ventilator and PCCM was consulted for vent management.  Of note, pt was in prone positioning for part of the procedure and upon OR staff turning him supine, they noted some facial and possible tongue edema.  There was a concern for possible angioedema.  In addition, neurosurgery had mentioned that they were concerned for new CVA given that pt was not moving left leg.  PAST MEDICAL HISTORY :   has a past medical history of Hypertension;  Diabetes mellitus without complication; DDD (degenerative disc disease); Vertigo; High cholesterol; Peripheral vascular disease; Pneumonia; History of kidney stones; gallstones; GERD (gastroesophageal reflux disease); Headache; and Wears dentures.  has past surgical history that includes Cholecystectomy; Skin graft; Vein Surgery; Diagnostic laparoscopy; Multiple tooth extractions; Vascular surgery; Eye surgery (Bilateral); Anterior cervical decomp/discectomy fusion (N/A, 09/06/2015); and Lumbar laminectomy/decompression microdiscectomy (N/A, 09/06/2015). Prior to Admission medications   Medication Sig Start Date End Date Taking? Authorizing Provider  aspirin EC 81 MG tablet Take 81 mg by mouth daily.   Yes Historical Provider, MD  folic acid (FOLVITE) 1 MG tablet Take 1 mg by mouth daily.  06/01/15  Yes Historical Provider, MD  hydrochlorothiazide (HYDRODIURIL) 25 MG tablet Take 25 mg by mouth daily as needed (for leg swelling).  07/14/15  Yes Historical Provider, MD  metFORMIN (GLUCOPHAGE) 500 MG tablet Take 500 mg by mouth 2 (two) times daily.   Yes Historical Provider, MD  methotrexate 2.5 MG tablet Take 20 mg by mouth every Saturday. Takes every saturday   Yes Historical Provider, MD  propranolol (INDERAL) 80 MG tablet Take 80 mg by mouth daily.   Yes Historical Provider, MD  rosuvastatin (CRESTOR) 10 MG tablet Take 10 mg by mouth daily.   Yes Historical Provider, MD  telmisartan (MICARDIS) 80 MG tablet Take 80 mg by mouth daily.   Yes Historical Provider, MD  oxyCODONE-acetaminophen (PERCOCET/ROXICET) 5-325 MG per tablet Take 1 tablet by mouth every 4 (four) hours as needed for pain.    Historical Provider, MD   Allergies  Allergen  Reactions  . Penicillins Other (See Comments)    unknown  . Adhesive [Tape] Rash    FAMILY HISTORY:  History reviewed. No pertinent family history.  SOCIAL HISTORY:  reports that he has quit smoking. He does not have any smokeless tobacco history on file. He  reports that he does not drink alcohol or use illicit drugs.  REVIEW OF SYSTEMS:  Unable to obtain as pt is encephalopathic.  SUBJECTIVE: More awake today.  VITAL SIGNS: Temp:  [97.7 F (36.5 C)-99.4 F (37.4 C)] 98.1 F (36.7 C) (09/22 0330) Pulse Rate:  [56-110] 71 (09/22 0900) Resp:  [17-38] 27 (09/22 0900) BP: (132-178)/(61-106) 163/81 mmHg (09/22 0900) SpO2:  [91 %-98 %] 95 % (09/22 0900) FiO2 (%):  [30 %] 30 % (09/21 1300) HEMODYNAMICS:   VENTILATOR SETTINGS: Vent Mode:  [-] PSV;CPAP FiO2 (%):  [30 %] 30 % PEEP:  [5 cmH20] 5 cmH20 Pressure Support:  [5 cmH20] 5 cmH20 INTAKE / OUTPUT: Intake/Output      09/21 0701 - 09/22 0700 09/22 0701 - 09/23 0700   I.V. (mL/kg) 3450 (38) 300 (3.3)   IV Piggyback 450    Total Intake(mL/kg) 3900 (43) 300 (3.3)   Urine (mL/kg/hr) 2650 (1.2)    Total Output 2650     Net +1250 +300          PHYSICAL EXAMINATION: General: Adult male, in no distress Neuro: Awake, Responds to commands. Cannot move left side. HEENT: PERRLA, EOMI. Cardiovascular: RRR, no M/R/G.  Lungs: Respirations even and unlabored.  CTA bilaterally, No W/R/R. Abdomen: BS x 4, soft, NT/ND.  Musculoskeletal: No gross deformities, no edema.  Skin: Intact, warm, no rashes.  LABS:  CBC  Recent Labs Lab 09/07/15 0233 09/08/15 0236 09/09/15 0242  WBC 10.2 8.5 6.3  HGB 11.9* 10.7* 11.1*  HCT 36.3* 32.0* 32.8*  PLT 99* 92* 83*   Coag's No results for input(s): APTT, INR in the last 168 hours. BMET  Recent Labs Lab 09/07/15 0233 09/08/15 0236 09/09/15 0242  NA 136 136 138  K 4.0 4.0 3.8  CL 104 107 109  CO2 BUN 14 22* 20  CREATININE 1.05 1.08 0.82  GLUCOSE 139* 144* 129*   Electrolytes  Recent Labs Lab 09/07/15 0233 09/08/15 0236 09/09/15 0242  CALCIUM 8.0* 8.0* 8.1*  MG 1.3*  --   --   PHOS 2.8  --   --    Sepsis Markers  Recent Labs Lab 09/06/15 1625  LATICACIDVEN 2.5*   ABG  Recent Labs Lab 09/06/15 1600  PHART  7.402  PCO2ART 40.1  PO2ART 103*   Liver Enzymes  Recent Labs Lab 09/06/15 1635  AST 22  ALT 12*  ALKPHOS 73  BILITOT 1.3*  ALBUMIN 2.8*   Cardiac Enzymes  Recent Labs Lab 09/06/15 1635  TROPONINI <0.03   Glucose  Recent Labs Lab 09/08/15 1151 09/08/15 1615 09/08/15 1932 09/08/15 2312 09/09/15 0323 09/09/15 0751  GLUCAP 128* 111* 147* 116* 133* 137*    Imaging CXR (9/22) Stable bibasilar opacities.   ASSESSMENT / PLAN:  NEUROLOGIC A:   Acute metabolic encephalopathy. Critical stenosis at C2-3 and C4-5 as well as lumbar stenosis at L3-4 and L4-5 - s/p C2-C3 and C4-5 anterior anterior cervical discectomy with interbody fusion, anterior instrumentation at C2-3 and C4-5, L3-4 decompressive laminectomy with bilateral L3 and l4 foraminotomies (09/06/15 by Dr. Jordan Likes). Concern for new infarct per neurosurgery. Hx DDD, vertigo, headaches. P:   Post op care per neurosurgery.  PULMONARY OETT 9/19 > 9/21 A: VDRF following surgery 9/19. ? Angioedema after surgery P:   Wean down Fio2 as tolerated. D/C steroids and H1, H2 blockers.  Incentive spirometry.  CARDIOVASCULAR A:  Hypotension post op. Hx HTN, HLD, PVD. P:  Stable. Reduce NS to 80 cc/hr  RENAL A:   No acute issues - no labs since 9/15. P:   Correct electrolytes as indicated.   GASTROINTESTINAL A:   GERD. Nutrition. P:  SUP: Pantoprazole. NPO. Speech eval.  HEMATOLOGIC A:   VTE Prophylaxis. P:  SCD's. CBC now and in AM.  INFECTIOUS A:   Surgical prophylaxis. P:   Monitor clinically.  ENDOCRINE A:   DM. P:   SSI. Hold outpatient metformin.  Family updated: None.  Interdisciplinary Family Meeting v Palliative Care Meeting:  Due by: 9/25.  Critical care time- 35 mins.  Chilton Greathouse MD [facility] Pager [phone] If no answer or after 3pm call: [phone] 09/09/2015, 9:24 AM

## 2015-09-09 NOTE — Evaluation (Signed)
Clinical/Bedside Swallow Evaluation Patient Details  Name: [name] MRN: [id number] Date of Birth: 1930-01-09  Today's Date: 09/09/2015 Time: SLP Start Time (ACUTE ONLY): 1426 SLP Stop Time (ACUTE ONLY): 1436 SLP Time Calculation (min) (ACUTE ONLY): 10 min  Past Medical History:  Past Medical History  Diagnosis Date  . Hypertension   . Diabetes mellitus without complication   . DDD (degenerative disc disease)   . Vertigo   . High cholesterol   . Peripheral vascular disease   . Pneumonia   . History of kidney stones   . Hx of gallstones   . GERD (gastroesophageal reflux disease)   . Headache   . Wears dentures    Past Surgical History:  Past Surgical History  Procedure Laterality Date  . Cholecystectomy    . Skin graft      Left leg  . Vein surgery    . Diagnostic laparoscopy    . Multiple tooth extractions    . Vascular surgery    . Eye surgery Bilateral   . Anterior cervical decomp/discectomy fusion N/A 09/06/2015    Procedure: Anterior Cervical Decompression Fusion Cervical two,Cervical three,Cervical four-,Cervical five ;  Surgeon: Julio Sicks, MD;  Location: [facility];  Service: Neurosurgery;  Laterality: N/A;  . Lumbar laminectomy/decompression microdiscectomy N/A 09/06/2015    Procedure: Lumbar three-four  decompresssive laminectomy  ;  Surgeon: Julio Sicks, MD;  Location: [facility];  Service: Neurosurgery;  Laterality: N/A;   HPI:  [name] is an 81 y.o. male with a past medical history significant for HTN, DM, hypercholesterolemia, PAD, GERD, who underwent C2-3 and C4-5 anterior cervical discectomy with interbody fusion as well as L3-4 decompressive laminectomy with bilateral L3 and L4 foraminotomies on 9/19, and few hours postoperatively was noted to have significant left hemiparesis that prompted CT brain which demonstrated findings concerning for acute ischemia in the right MCA territory. Pt remained on the vent post-operatively 9/19-9/21.    Assessment / Plan / Recommendation Clinical Impression  Pt is not yet ready for PO diet. He has multiple swallows, wet vocal quality, and immediate, wet coughing with all consistencies tested. Question impact of possible edema from recent surgery in addition to acute CVA. Will f/u on next date to determine PO readiness versus likely need for more objective testing.  Also recommend MD consider SLP cognitive-linguistic evaluation for pt with acute CVA - please order if agree.    Aspiration Risk  Severe    Diet Recommendation NPO   Medication Administration: Via alternative means    Other  Recommendations Oral Care Recommendations: Oral care QID   Follow Up Recommendations       Frequency and Duration min 3x week  1 week   Pertinent Vitals/Pain n/a    SLP Swallow Goals     Swallow Study Prior Functional Status       General Date of Onset: 09/06/15 Other Pertinent Information: [name] is an 81 y.o. male with a past medical history significant for HTN, DM, hypercholesterolemia, PAD, GERD, who underwent C2-3 and C4-5 anterior cervical discectomy with interbody fusion as well as L3-4 decompressive laminectomy with bilateral L3 and L4 foraminotomies on 9/19, and few hours postoperatively was noted to have significant left hemiparesis that prompted CT brain which demonstrated findings concerning for acute ischemia in the right MCA territory. Pt remained on the vent post-operatively 9/19-9/21. Type of Study: Bedside swallow evaluation Previous Swallow Assessment: none in chart Diet Prior to this Study: NPO Temperature Spikes Noted:  Yes (99.4) Respiratory Status: Supplemental O2 delivered via (comment) ([place]) History of Recent Intubation: Yes Length of Intubations (days): 3 days Date extubated: 09/08/15 Behavior/Cognition: Alert;Cooperative;Requires cueing Oral Cavity - Dentition: Missing dentition Self-Feeding Abilities: Needs assist Patient Positioning: Upright in  bed Baseline Vocal Quality: Normal Volitional Cough: Congested    Oral/Motor/Sensory Function Overall Oral Motor/Sensory Function:  (difficulty following commands to assess)   Ice Chips Ice chips: Not tested   Thin Liquid Thin Liquid: Impaired Presentation: Straw Pharyngeal  Phase Impairments: Suspected delayed Swallow;Multiple swallows;Wet Vocal Quality;Cough - Immediate    Nectar Thick Nectar Thick Liquid: Impaired Presentation: Straw Pharyngeal Phase Impairments: Suspected delayed Swallow;Multiple swallows;Cough - Delayed;Wet Vocal Quality   Honey Thick Honey Thick Liquid: Not tested   Puree Puree: Impaired Presentation: Spoon Pharyngeal Phase Impairments: Suspected delayed Swallow;Multiple swallows;Wet Vocal Quality;Cough - Delayed   Solid   Solid: Not tested      Maxcine Ham, M.A. CCC-SLP [phone]  Maxcine Ham 09/09/2015,2:50 PM

## 2015-09-09 NOTE — Progress Notes (Signed)
STROKE TEAM PROGRESS NOTE  HPI [name] is an 81 y.o. male with a past medical history significant for HTN, DM, hypercholesterolemia, PAD, GERD, who underwent C2-3 and C4-5 anterior cervical discectomy with interbody fusion as well as L3-4 decompressive laminectomy with bilateral L3 and L4 foraminotomies on 9/19, and few hours postoperatively was noted to have significant left hemiparesis that prompted CT brain which demonstrated findings concerning for acute ischemia in the right MCA territory. Neurology consulted for further evaluation. Patient reportedly hypotensive during surgery.  At this moment, he is intubated on the vent, able to follow commands, with a right gaze preference and dense left hemiparesis.  Date last known well: 09/06/15 Time last known well: unable to determine. tPA Given: no, major surgery 9/19, unknown time LKW      SUBJECTIVE (INTERVAL HISTORY) His RN is at the bedside.  Overall he feels his condition is stable.   Marland Kitchen He continues to have left hemiplegia and neglect   OBJECTIVE Temp:  [97 F (36.1 C)-99.4 F (37.4 C)] 97 F (36.1 C) (09/22 0800) Pulse Rate:  [56-110] 81 (09/22 1400) Cardiac Rhythm:  [-] Normal sinus rhythm (09/22 0800) Resp:  [17-38] 31 (09/22 1400) BP: (132-179)/(61-106) 177/76 mmHg (09/22 1400) SpO2:  [91 %-98 %] 91 % (09/22 1400)     CBC:  Recent Labs Lab 09/02/15 1439  09/08/15 0236 09/09/15 0242  WBC 9.1  < > 8.5 6.3  NEUTROABS 6.2  --   --   --   HGB 15.6  < > 10.7* 11.1*  HCT 46.6  < > 32.0* 32.8*  MCV 90.8  < > 89.1 88.6  PLT 143*  < > 92* 83*  < > = values in this interval not displayed.  Basic Metabolic Panel:   Recent Labs Lab 09/07/15 0233 09/08/15 0236 09/09/15 0242  NA 136 136 138  K 4.0 4.0 3.8  CL 104 107 109  CO2 GLUCOSE 139* 144* 129*  BUN 14 22* 20  CREATININE 1.05 1.08 0.82  CALCIUM 8.0* 8.0* 8.1*  MG 1.3*  --   --   PHOS 2.8  --   --     Lipid Panel:     Component Value  Date/Time   CHOL 76 09/08/2015 0236   TRIG 53 09/08/2015 0236   HDL 24* 09/08/2015 0236   CHOLHDL 3.2 09/08/2015 0236   VLDL 11 09/08/2015 0236   LDLCALC 41 09/08/2015 0236   HgbA1c:  Lab Results  Component Value Date   HGBA1C 6.1* 09/07/2015   Urine Drug Screen: No results found for: LABOPIA, COCAINSCRNUR, LABBENZ, AMPHETMU, THCU, LABBARB    IMAGING  Dg Cervical Spine 1 View 09/06/2015    ACDF C2-3 and C4-5  Ct Head Wo Contrast 09/06/2015   Chronic atrophic changes.  Generalized decreased attenuation in the distribution of the right middle cerebral artery suggestive of early ischemia. MRI may be helpful for further evaluation.      Dg Lumbar Spine 1 View 09/06/2015    Metallic marker noted posteriorly at the L4-L5 level.     Dg Chest Port 1 View 09/06/2015    Lines and tubes as described above.    Dg C-arm 1-60 Min 09/06/2015    ACDF C2-3 and C4-5.      PHYSICAL EXAM Elderly Caucasian male in mild distress.   . Afebrile. Head is nontraumatic. Neck is supple without bruit.    Cardiac exam no murmur or gallop. Lungs are clear to auscultation. Distal  pulses are well felt. Neurological Exam :  Awake alert. Speech is soft but clear and no dysarthria Right gaze preference but able to follow gaze to the left past midline. Blinks to threat on the right but not on the left. Pupils 3 mm irregular reactive. Corneal reflexes are present. Left lower facial weakness. Tongue midline. Moves right upper and lower extremity purposefully well against gravity without weakness. Left upper extremity is plegic. Left lower extremity can withdraw against gravity. Left hemi-neglect present. Unable to recognize his own left hand Decreased sensation on the left hemibody. Tone is decreased on the left. Left plantar upgoing right downgoing.  ASSESSMENT/PLAN [name] is a 81 y.o. male with history of HTN, DM, hypercholesterolemia, PAD, GERD, who underwent C2-3 and C4-5 anterior cervical  discectomy with interbody fusion as well as L3-4 decompressive laminectomy with bilateral L3 and L4 foraminotomies on 9/19  presenting with left hemiparesis.  He did not receive IV t-PA due to recent surgery.  Stroke:  Scattered right MCA, right PCA as well as left MCA and right superior cerebellar artery infarcts of embolic etiology without definite identified source  .  Resultant left hemiparesis, left hemi-neglect and left gaze palsy MRI   Large right MCA and partial right PCA infarcts with mild petechial hemorrhage, cytotoxic edema, and mild intracranial mass effect with leftward midline shift of 3 mm. Scattered small left MCA and occasional right SCA acute infarcts MRA  head and neck - 1. 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 2. Scattered small left MCA and occasional right SCA acute infarcts. 3. Bilateral ICA origin stenosis of up to 60% in the neck. No emergent large vessel occlusion. No intracranial stenosis or MCA  branch occlusion identified.  Head CT - Generalized decreased attenuation in the distribution of the right middle cerebral artery suggestive of early ischemia. Carotid Doppler  Findings consistent with 40 - 59 percent stenosis involving the  right internal carotid artery and the left internal carotid  artery. - Right vertebral artery not insonated. Patient unable to move head  due to spinal surgery. Left vertebral artery is antegrade.    2D EchoLeft ventricle: The cavity size was normal. There was moderate concentric hypertrophy. Systolic function was normal. The estimated ejection fraction was in the range of 60% to 65%. Wall motion was normal; there were no regional wall motion abnormalities. - Aortic valve: Moderate diffuse thickening and calcification, consistent with sclerosis.  LDL 41 mg%  HgbA1c 6.1 %  VTE  prophylaxis SCDs Diet NPO time specified  aspirin 81 mg orally every day prior to admission, now on aspirin 300 mg suppository daily  Ongoing aggressive stroke risk factor management  Therapy recommendations: Pending  Disposition: Pending  Hypertension  Stable  Permissive hypertension (OK if < 220/120) but gradually normalize in 5-7 days  Hyperlipidemia  Home meds: Crestor 10 mg daily  resumed in hospital  LDL 41, goal < 70  Continue statin at discharge  Diabetes  HgbA1c 6.1, goal < 7.0  Controlled  Other Stroke Risk Factors  Advanced age  Cigarette smoker, quit smoking    Other Active Problems  Low magnesium level  Low calcium level  Mild thrombocytopenia  Status post lumbar and cervical surgeries 09/06/2015  Hospital day # 3 I have personally examined this patient, reviewed notes, independently viewed imaging studies, participated in medical decision making and plan of care. I  have made any additions or clarifications directly to the above note. Agree with note above. Patient has had a large right hemispheric infarct following recent cervical spine surgery etiology likely likely embolic given strokes in multiple vascular distributions..  Patient unfortunately is not a candidate for anticoagulation at the present time given recent major spine surgery. Hence we will not pursue further workup with TEE and loop recorder at the present time  Mobilize out of bed. Physical occupational therapy consult. Speech therapy swallow eval. Transfer to neurology floor or the next 1-2 days. Delia Heady, MD Medical Director [facility] Pager: [phone] 09/09/2015 2:14 PM     To contact Stroke Continuity provider, please refer to [email or web address]. After hours, contact General Neurology

## 2015-09-09 NOTE — Care Management Note (Signed)
Case Management Note  Patient Details  Name: AKITO BOOMHOWER MRN: [id number] Date of Birth: 25-May-1930  Subjective/Objective: Pt s/p ACDF on 09/06/15.  PTA, pt resided at home with family members.                     Action/Plan: Will follow for discharge planning as pt progresses.  PT/OT and ST consults pending.    Expected Discharge Date:                  Expected Discharge Plan:  Home w Home Health Services  In-House Referral:     Discharge planning Services  CM Consult  Post Acute Care Choice:    Choice offered to:     DME Arranged:    DME Agency:     HH Arranged:    HH Agency:     Status of Service:  In process, will continue to follow  Medicare Important Message Given:    Date Medicare IM Given:    Medicare IM give by:    Date Additional Medicare IM Given:    Additional Medicare Important Message give by:     If discussed at Long Length of Stay Meetings, dates discussed:    Additional Comments:  Quintella Baton, RN, BSN  Trauma/Neuro ICU Case Manager [phone]

## 2015-09-10 ENCOUNTER — Inpatient Hospital Stay (HOSPITAL_COMMUNITY): Payer: Medicare Other

## 2015-09-10 DIAGNOSIS — G8190 Hemiplegia, unspecified affecting unspecified side: Secondary | ICD-10-CM

## 2015-09-10 DIAGNOSIS — I639 Cerebral infarction, unspecified: Secondary | ICD-10-CM

## 2015-09-10 DIAGNOSIS — I6529 Occlusion and stenosis of unspecified carotid artery: Secondary | ICD-10-CM | POA: Insufficient documentation

## 2015-09-10 DIAGNOSIS — E785 Hyperlipidemia, unspecified: Secondary | ICD-10-CM | POA: Insufficient documentation

## 2015-09-10 DIAGNOSIS — M4802 Spinal stenosis, cervical region: Principal | ICD-10-CM

## 2015-09-10 DIAGNOSIS — Z720 Tobacco use: Secondary | ICD-10-CM

## 2015-09-10 DIAGNOSIS — F17200 Nicotine dependence, unspecified, uncomplicated: Secondary | ICD-10-CM | POA: Insufficient documentation

## 2015-09-10 DIAGNOSIS — I6523 Occlusion and stenosis of bilateral carotid arteries: Secondary | ICD-10-CM

## 2015-09-10 LAB — GLUCOSE, CAPILLARY
GLUCOSE-CAPILLARY: 123 mg/dL — AB (ref 65–99)
GLUCOSE-CAPILLARY: 95 mg/dL (ref 65–99)
GLUCOSE-CAPILLARY: 90 mg/dL (ref 65–99)
GLUCOSE-CAPILLARY: 90 mg/dL (ref 65–99)
GLUCOSE-CAPILLARY: 94 mg/dL (ref 65–99)
Glucose-Capillary: 119 mg/dL — ABNORMAL HIGH (ref 65–99)
Glucose-Capillary: 160 mg/dL — ABNORMAL HIGH (ref 65–99)

## 2015-09-10 LAB — BASIC METABOLIC PANEL
ANION GAP: 6 (ref 5–15)
BUN: 18 mg/dL (ref 6–20)
CHLORIDE: 105 mmol/L (ref 101–111)
CO2: 28 mmol/L (ref 22–32)
CREATININE: 0.69 mg/dL (ref 0.61–1.24)
Calcium: 8.1 mg/dL — ABNORMAL LOW (ref 8.9–10.3)
GFR calc non Af Amer: >60 mL/min (ref >60)
GLUCOSE: 123 mg/dL — AB (ref 65–99)
Potassium: 3.3 mmol/L — ABNORMAL LOW (ref 3.5–5.1)
Sodium: 139 mmol/L (ref 135–145)

## 2015-09-10 LAB — CBC
HEMATOCRIT: 34.5 % — AB (ref 39.0–52.0)
HEMOGLOBIN: 12 g/dL — AB (ref 13.0–17.0)
MCH: 30.5 pg (ref 26.0–34.0)
MCHC: 34.8 g/dL (ref 30.0–36.0)
MCV: 87.6 fL (ref 78.0–100.0)
Platelets: 102 10*3/uL — ABNORMAL LOW (ref 150–400)
RBC: 3.94 MIL/uL — ABNORMAL LOW (ref 4.22–5.81)
RDW: 14.6 % (ref 11.5–15.5)
WBC: 7.7 10*3/uL (ref 4.0–10.5)

## 2015-09-10 MED ORDER — IOHEXOL 300 MG/ML  SOLN
50.0000 mL | Freq: Once | INTRAMUSCULAR | Status: DC | PRN
  Administered 2015-09-10: 20 mL
  Filled 2015-09-10: qty 50

## 2015-09-10 MED ORDER — CLOPIDOGREL BISULFATE 75 MG PO TABS
75.0000 mg | ORAL_TABLET | Freq: Every day | ORAL | Status: DC
  Administered 2015-09-10 – 2015-09-19 (×8): 75 mg via ORAL
  Filled 2015-09-10 (×9): qty 1

## 2015-09-10 MED ORDER — POTASSIUM CHLORIDE 10 MEQ/100ML IV SOLN
10.0000 meq | INTRAVENOUS | Status: AC
  Administered 2015-09-10 (×4): 10 meq via INTRAVENOUS
  Filled 2015-09-10 (×4): qty 100

## 2015-09-10 MED ORDER — PRO-STAT SUGAR FREE PO LIQD
30.0000 mL | Freq: Two times a day (BID) | ORAL | Status: DC
  Administered 2015-09-10 – 2015-09-27 (×29): 30 mL
  Filled 2015-09-10 (×29): qty 30

## 2015-09-10 MED ORDER — STROKE: EARLY STAGES OF RECOVERY BOOK
Freq: Once | Status: AC
  Administered 2015-09-10: 05:00:00
  Filled 2015-09-10: qty 1

## 2015-09-10 MED ORDER — LIDOCAINE VISCOUS 2 % MT SOLN
OROMUCOSAL | Status: AC
  Filled 2015-09-10: qty 15

## 2015-09-10 MED ORDER — JEVITY 1.2 CAL PO LIQD
1000.0000 mL | ORAL | Status: DC
  Administered 2015-09-10 – 2015-09-11 (×2): 1000 mL
  Administered 2015-09-13: 20 mL/h
  Administered 2015-09-17 – 2015-09-23 (×7): 1000 mL
  Filled 2015-09-10 (×21): qty 1000

## 2015-09-10 MED ORDER — ASPIRIN EC 81 MG PO TBEC
81.0000 mg | DELAYED_RELEASE_TABLET | Freq: Every day | ORAL | Status: DC
  Administered 2015-09-11 – 2015-09-22 (×10): 81 mg via ORAL
  Filled 2015-09-10 (×10): qty 1

## 2015-09-10 MED ORDER — HEPARIN SODIUM (PORCINE) 5000 UNIT/ML IJ SOLN
5000.0000 [IU] | Freq: Three times a day (TID) | INTRAMUSCULAR | Status: DC
  Administered 2015-09-10 – 2015-09-25 (×45): 5000 [IU] via SUBCUTANEOUS
  Filled 2015-09-10 (×45): qty 1

## 2015-09-10 NOTE — Consult Note (Signed)
Physical Medicine and Rehabilitation Consult  Reason for Consult: Left sided weakness, left gaze palsy, dysphagia.  Referring Physician:  Dr. Jordan Likes   HPI: [name] is a 81 y.o. male with history of HTN, DM type2, PVD, progressive BUE and BLE weakness due to cervical myelopathy C2-C3 , C4-5 and critical lumbar stenosis L3/4. He was admitted on 09/06/15 for ACDF C2/3 and C4/5 and C3/4 decompressive laminectomy by Dr.Kritzer. Peri-operatively with hypotension and patient noted to have LLE weakness during positioning.  CCM consulted for vent management and concerns of angioedema.  Few hours post op, patient noted to have dense left hemiparesis and right gaze preference.  CT head with concerns for acute R-MCA ischemia and Neurology consulted for input.  MRI/MRA brain done revealing large R-MCA infarct with R-MCA/PCA watershed involvement extending to lateral occipital lobe with mild petechial hemorrhage and edema.  Scattered small L-MCA and R-SCA infarcts, bilateral ICA origin stenosis of 60%.  Carotid dopplers with 40-59% B-ICA stenosis.  2D echo with EF 60-65%. AV sclerosis and no wall abnormality. Dr. Pearlean Brownie felt that patient with embolic stroke but not a candidate for anticoagulation therefore TEE and loop recorder not indicated at this time. He tolerated extubation on 09/21 and PT/OT evaluations done yesterday.  NPO recommended due to wet voice and question of edema from recent surgery. Patient with resultant dense left hemiparesis with left neglect, ataxia, right gaze preference, left gaze palsy and dysphagia.  CIR recommended by MD and Rehab team.   Review of Systems  Unable to perform ROS: mental acuity  HENT: Negative for hearing loss.   Eyes: Negative for blurred vision and double vision.  Respiratory: Negative for cough and shortness of breath.   Cardiovascular: Negative for chest pain and palpitations.  Gastrointestinal: Negative for abdominal pain.  Musculoskeletal: Negative  for back pain.  Neurological: Positive for dizziness (on and off). Negative for headaches.  Psychiatric/Behavioral: Positive for memory loss.      Past Medical History  Diagnosis Date  . Hypertension   . Diabetes mellitus without complication   . DDD (degenerative disc disease)   . Vertigo   . High cholesterol   . Peripheral vascular disease   . Pneumonia   . History of kidney stones   . Hx of gallstones   . GERD (gastroesophageal reflux disease)   . Headache   . Wears dentures      Past Surgical History  Procedure Laterality Date  . Cholecystectomy    . Skin graft      Left leg  . Vein surgery    . Diagnostic laparoscopy    . Multiple tooth extractions    . Vascular surgery    . Eye surgery Bilateral   . Anterior cervical decomp/discectomy fusion N/A 09/06/2015    Procedure: Anterior Cervical Decompression Fusion Cervical two,Cervical three,Cervical four-,Cervical five ;  Surgeon: Julio Sicks, MD;  Location: [facility];  Service: Neurosurgery;  Laterality: N/A;  . Lumbar laminectomy/decompression microdiscectomy N/A 09/06/2015    Procedure: Lumbar three-four  decompresssive laminectomy  ;  Surgeon: Julio Sicks, MD;  Location: [facility];  Service: Neurosurgery;  Laterality: N/A;     History reviewed. No pertinent family history.    Social History:  Lives alone.  Retired from [employer] in mid 90s.  Nephew (EMT) lives next door and was assisting with ADL as well as house work occasionally? Has been using walker past 6 months with limited mobility.  He reports that he has quit  smoking. He does not have any smokeless tobacco history on file. He reports that he does not drink alcohol or use illicit drugs.    Allergies  Allergen Reactions  . Penicillins Other (See Comments)    unknown  . Adhesive [Tape] Rash    Medications Prior to Admission  Medication Sig Dispense Refill  . aspirin EC 81 MG tablet Take 81 mg by mouth daily.    . folic acid (FOLVITE) 1 MG tablet Take 1  mg by mouth daily.     . hydrochlorothiazide (HYDRODIURIL) 25 MG tablet Take 25 mg by mouth daily as needed (for leg swelling).     . metFORMIN (GLUCOPHAGE) 500 MG tablet Take 500 mg by mouth 2 (two) times daily.    . methotrexate 2.5 MG tablet Take 20 mg by mouth every Saturday. Takes every saturday    . propranolol (INNOPRAN XL) 80 MG 24 hr capsule Take 80 mg by mouth at bedtime.    . rosuvastatin (CRESTOR) 10 MG tablet Take 10 mg by mouth daily.    Marland Kitchen telmisartan (MICARDIS) 80 MG tablet Take 80 mg by mouth daily.    Marland Kitchen oxyCODONE-acetaminophen (PERCOCET/ROXICET) 5-325 MG per tablet Take 1 tablet by mouth every 4 (four) hours as needed for pain.      Home: Home Living Family/patient expects to be discharged to:: Private residence Living Arrangements: Alone, Non-relatives/Friends Available Help at Discharge: Family, Friend(s), Available PRN/intermittently (alone ?5-6 hours per day.) Type of Home: House Home Layout: One level Bathroom Shower/Tub: Engineer, manufacturing systems: Standard Home Equipment: None  Functional History: Prior Function Level of Independence: Independent with assistive device(s) Functional Status:  Mobility: Bed Mobility Overal bed mobility: Needs Assistance Bed Mobility: Rolling, Sidelying to Sit, Sit to Sidelying Rolling: Max assist Sidelying to sit: Max assist Sit to sidelying: Max assist General bed mobility comments: cues and physical assist throughout the movement Transfers Overall transfer level: Needs assistance      ADL:    Cognition: Cognition Overall Cognitive Status: Impaired/Different from baseline Orientation Level: Oriented to person, Oriented to place Cognition Arousal/Alertness: Awake/alert Behavior During Therapy: Flat affect Overall Cognitive Status: Impaired/Different from baseline Area of Impairment: Attention, Following commands, Safety/judgement, Awareness, Orientation Orientation Level: Situation, Time Current Attention  Level: Sustained Following Commands: Follows one step commands with increased time Safety/Judgement: Decreased awareness of safety Awareness: Intellectual General Comments: Unable to help pt realize that "Chanetta Marshall" is not in the room with [place].  Blood pressure 158/72, pulse 75, temperature 97.9 F (36.6 C), temperature source Oral, resp. rate 24, weight 90.7 kg (199 lb 15.3 oz), SpO2 90 %. Physical Exam  Nursing note and vitals reviewed. Constitutional: He appears well-developed and well-nourished. Cervical collar and nasal cannula in place.  HENT:  Head: Normocephalic and atraumatic.  Eyes: Conjunctivae are normal. Pupils are equal, round, and reactive to light.  Neck:  Immobilized by cervical collar  Cardiovascular: Normal rate and regular rhythm.   No murmur heard. Respiratory: Effort normal. No accessory muscle usage. He has rhonchi. He has no rales. He exhibits no tenderness.  Wet sounds due to oral secretions.   GI: Soft. Bowel sounds are normal. He exhibits no distension. There is no tenderness.  Musculoskeletal: He exhibits edema (bilateral hands). He exhibits no tenderness.  Neurological: He is alert. He is disoriented.  Oriented to self only. Left facial weakness with moderate to severe dysarthria. Wet voice Place "[place]" "Michell Heinrich" situation with cues. Confused and lacks awareness of deficits. Hallucinating and calling out for "Misty Stanley" to  help answer questions.   Left gaze palsy with right gaze preference.  Was able to move eyes to midline with cues.  Extensor tone LUE> RUE with  Left hemiparesis.   Skin: Skin is warm and dry.  Psychiatric: His speech is slurred. He is slowed. Cognition and memory are impaired. He expresses inappropriate judgment.    Results for orders placed or performed during the hospital encounter of 09/06/15 (from the past 24 hour(s))  Glucose, capillary     Status: Abnormal   Collection Time: 09/09/15 11:44 AM  Result Value Ref Range    Glucose-Capillary 121 (H) 65 - 99 mg/dL  Glucose, capillary     Status: Abnormal   Collection Time: 09/09/15  5:12 PM  Result Value Ref Range   Glucose-Capillary 119 (H) 65 - 99 mg/dL  Vancomycin, trough     Status: Abnormal   Collection Time: 09/09/15  6:43 PM  Result Value Ref Range   Vancomycin Tr 24 (H) 10.0 - 20.0 ug/mL  Glucose, capillary     Status: Abnormal   Collection Time: 09/09/15  7:55 PM  Result Value Ref Range   Glucose-Capillary 147 (H) 65 - 99 mg/dL  Glucose, capillary     Status: Abnormal   Collection Time: 09/10/15 12:12 AM  Result Value Ref Range   Glucose-Capillary 119 (H) 65 - 99 mg/dL  CBC     Status: Abnormal   Collection Time: 09/10/15  2:42 AM  Result Value Ref Range   WBC 7.7 4.0 - 10.5 K/uL   RBC 3.94 (L) 4.22 - 5.81 MIL/uL   Hemoglobin 12.0 (L) 13.0 - 17.0 g/dL   HCT 16.1 (L) 09.6 - 04.5 %   MCV 87.6 78.0 - 100.0 fL   MCH 30.5 26.0 - 34.0 pg   MCHC 34.8 30.0 - 36.0 g/dL   RDW 40.9 81.1 - 91.4 %   Platelets 102 (L) 150 - 400 K/uL  Basic metabolic panel     Status: Abnormal   Collection Time: 09/10/15  2:42 AM  Result Value Ref Range   Sodium 139 135 - 145 mmol/L   Potassium 3.3 (L) 3.5 - 5.1 mmol/L   Chloride 105 101 - 111 mmol/L   CO2 28 22 - 32 mmol/L   Glucose, Bld 123 (H) 65 - 99 mg/dL   BUN 18 6 - 20 mg/dL   Creatinine, Ser 7.82 0.61 - 1.24 mg/dL   Calcium 8.1 (L) 8.9 - 10.3 mg/dL   GFR calc non Af Amer >60 >60 mL/min   GFR calc Af Amer >60 >60 mL/min   Anion gap 6 5 - 15  Glucose, capillary     Status: Abnormal   Collection Time: 09/10/15  4:36 AM  Result Value Ref Range   Glucose-Capillary 123 (H) 65 - 99 mg/dL  Glucose, capillary     Status: None   Collection Time: 09/10/15  8:16 AM  Result Value Ref Range   Glucose-Capillary 95 65 - 99 mg/dL   Mr Shirlee Latch Wo Contrast  09/08/2015   CLINICAL DATA:  81 year old male with left side weakness following cervical spine and lumbar surgery. Initial encounter.  EXAM: MRI HEAD WITHOUT  CONTRAST AND WITH CONTRAST  MRA HEAD WITHOUT CONTRAST  MRA NECK WITHOUT AND WITH CONTRAST  TECHNIQUE: Multiplanar, multiecho pulse sequences of the brain and surrounding structures were obtained without intravenous contrast. Angiographic images of the Circle of Willis were obtained using MRA technique without intravenous contrast. Angiographic images of the neck were obtained using MRA  technique without and with intravenous contrast. Carotid stenosis measurements (when applicable) are obtained utilizing NASCET criteria, using the distal internal carotid diameter as the denominator.  CONTRAST:  20mL MULTIHANCE GADOBENATE DIMEGLUMINE 529 MG/ML IV SOLN  COMPARISON:  Head CT without contrast 09/06/2015. Preoperative Cervical spine MRI 08/12/2015  FINDINGS: MRI HEAD FINDINGS  Confluent restricted diffusion throughout most of the right hemisphere. Near complete involvement of the right MCA territory. Right MCA/PCA watershed involvement extending to the lateral occipital pole. Scattered involvement elsewhere in the right PCA territory. Some of the right temporal lobe also is spared.  Scattered small areas of restricted diffusion also in the left MCA territory, an the right superior cerebellar artery territory.  Major intracranial vascular flow voids are preserved, see MRA findings below.  Confluent cytotoxic edema in the right hemisphere. Areas of petechial hemorrhage in the anterior and posterior right MCA territories. Mass effect on the right lateral ventricle. Mild leftward midline shift of 3 mm. No ventriculomegaly. Basilar cisterns are patent. No extra-axial or intraventricular hemorrhage. Following contrast, no significant post ischemic enhancement.  Minimal other nonspecific cerebral white matter T2 and FLAIR hyperintensity. There is a small chronic lacunar infarct in the right PICA territory (series 6, image 5).  Visible internal auditory structures appear normal. Trace mastoid fluid. Mild paranasal sinus mucosal  thickening. The patient is intubated. Trace fluid in the oral cavity. Postoperative changes to the globes. Negative scalp soft tissues.  Hardware susceptibility artifact in the cervical spine. Chronic spinal Stenosis with mass effect on the spinal cord at C2-C3 (series 3, image 13).  MRA NECK FINDINGS  Precontrast time-of-flight imaging. A antegrade flow in both carotid and vertebral arteries in the neck and to the skullbase.  Post-contrast neck MRA images. Three vessel arch configuration. No great vessel origin stenosis.  Negative right CCA proximal to the right carotid bifurcation. At the right ICA origin there is irregularity and stenosis of up to 60 % with respect to the distal vessel (series 1302, image 84 and series 82956, image 5). The cervical right ICA is patent and otherwise appears negative to the skullbase.  Negative left CCA. Irregularity and stenosis at the left ICA origin also measuring up to 60 % with respect to the distal vessel and best seen on series 1302, image 71. The left ICA remains patent and is otherwise negative to the skullbase.  No proximal subclavian artery stenosis. Normal left vertebral artery origin. Mild if any stenosis at the right vertebral artery origin. The left vertebral artery is mildly dominant throughout.  MRA HEAD FINDINGS  Antegrade flow in the posterior circulation. Dominant distal left vertebral artery. Normal right PICA origin. Dominant appearing left AICA with a patent origin. Patent vertebrobasilar junction. No basilar stenosis. Normal SCA and PCA origins, fetal type on the right. Both posterior communicating arteries are present. Bilateral PCA branches are within normal limits.  Antegrade flow in both ICA siphons. No siphon stenosis. Ophthalmic and posterior communicating artery origins are within normal limits. Small probable atherosclerotic pseudo lesion of the left ICA siphon in the superior hypophyseal region (series 505, image 11). Patent carotid termini. Normal  MCA and ACA origins.  Anterior communicating artery and visualized ACA branches are within normal limits. Left MCA M1 segment and bifurcation are within normal limits. No left MCA branch occlusion identified. Right MCA M1 segment and bifurcation are within normal limits. No major right MCA branch occlusion identified.  IMPRESSION: 1. Large right MCA and partial right PCA infarcts with mild petechial hemorrhage, cytotoxic edema,  and mild intracranial mass effect with leftward midline shift of 3 mm. 2. Scattered small left MCA and occasional right SCA acute infarcts. 3. Bilateral ICA origin stenosis of up to 60% in the neck. No emergent large vessel occlusion. No intracranial stenosis or MCA branch occlusion identified. 4. Chronic spinal stenosis with spinal cord mass effect at C2-C3. New upper cervical fusion hardware.   Electronically Signed   By: Odessa Fleming M.D.   On: 09/08/2015 10:20   Mr Angiogram Neck W Wo Contrast  09/08/2015   CLINICAL DATA:  81 year old male with left side weakness following cervical spine and lumbar surgery. Initial encounter.  EXAM: MRI HEAD WITHOUT CONTRAST AND WITH CONTRAST  MRA HEAD WITHOUT CONTRAST  MRA NECK WITHOUT AND WITH CONTRAST  TECHNIQUE: Multiplanar, multiecho pulse sequences of the brain and surrounding structures were obtained without intravenous contrast. Angiographic images of the Circle of Willis were obtained using MRA technique without intravenous contrast. Angiographic images of the neck were obtained using MRA technique without and with intravenous contrast. Carotid stenosis measurements (when applicable) are obtained utilizing NASCET criteria, using the distal internal carotid diameter as the denominator.  CONTRAST:  20mL MULTIHANCE GADOBENATE DIMEGLUMINE 529 MG/ML IV SOLN  COMPARISON:  Head CT without contrast 09/06/2015. Preoperative Cervical spine MRI 08/12/2015  FINDINGS: MRI HEAD FINDINGS  Confluent restricted diffusion throughout most of the right hemisphere.  Near complete involvement of the right MCA territory. Right MCA/PCA watershed involvement extending to the lateral occipital pole. Scattered involvement elsewhere in the right PCA territory. Some of the right temporal lobe also is spared.  Scattered small areas of restricted diffusion also in the left MCA territory, an the right superior cerebellar artery territory.  Major intracranial vascular flow voids are preserved, see MRA findings below.  Confluent cytotoxic edema in the right hemisphere. Areas of petechial hemorrhage in the anterior and posterior right MCA territories. Mass effect on the right lateral ventricle. Mild leftward midline shift of 3 mm. No ventriculomegaly. Basilar cisterns are patent. No extra-axial or intraventricular hemorrhage. Following contrast, no significant post ischemic enhancement.  Minimal other nonspecific cerebral white matter T2 and FLAIR hyperintensity. There is a small chronic lacunar infarct in the right PICA territory (series 6, image 5).  Visible internal auditory structures appear normal. Trace mastoid fluid. Mild paranasal sinus mucosal thickening. The patient is intubated. Trace fluid in the oral cavity. Postoperative changes to the globes. Negative scalp soft tissues.  Hardware susceptibility artifact in the cervical spine. Chronic spinal Stenosis with mass effect on the spinal cord at C2-C3 (series 3, image 13).  MRA NECK FINDINGS  Precontrast time-of-flight imaging. A antegrade flow in both carotid and vertebral arteries in the neck and to the skullbase.  Post-contrast neck MRA images. Three vessel arch configuration. No great vessel origin stenosis.  Negative right CCA proximal to the right carotid bifurcation. At the right ICA origin there is irregularity and stenosis of up to 60 % with respect to the distal vessel (series 1302, image 84 and series 16109, image 5). The cervical right ICA is patent and otherwise appears negative to the skullbase.  Negative left CCA.  Irregularity and stenosis at the left ICA origin also measuring up to 60 % with respect to the distal vessel and best seen on series 1302, image 71. The left ICA remains patent and is otherwise negative to the skullbase.  No proximal subclavian artery stenosis. Normal left vertebral artery origin. Mild if any stenosis at the right vertebral artery origin. The left vertebral  artery is mildly dominant throughout.  MRA HEAD FINDINGS  Antegrade flow in the posterior circulation. Dominant distal left vertebral artery. Normal right PICA origin. Dominant appearing left AICA with a patent origin. Patent vertebrobasilar junction. No basilar stenosis. Normal SCA and PCA origins, fetal type on the right. Both posterior communicating arteries are present. Bilateral PCA branches are within normal limits.  Antegrade flow in both ICA siphons. No siphon stenosis. Ophthalmic and posterior communicating artery origins are within normal limits. Small probable atherosclerotic pseudo lesion of the left ICA siphon in the superior hypophyseal region (series 505, image 11). Patent carotid termini. Normal MCA and ACA origins.  Anterior communicating artery and visualized ACA branches are within normal limits. Left MCA M1 segment and bifurcation are within normal limits. No left MCA branch occlusion identified. Right MCA M1 segment and bifurcation are within normal limits. No major right MCA branch occlusion identified.  IMPRESSION: 1. 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 2. Scattered small left MCA and occasional right SCA acute infarcts. 3. Bilateral ICA origin stenosis of up to 60% in the neck. No emergent large vessel occlusion. No intracranial stenosis or MCA branch occlusion identified. 4. Chronic spinal stenosis with spinal cord mass effect at C2-C3. New upper cervical fusion hardware.   Electronically  Signed   By: Odessa Fleming M.D.   On: 09/08/2015 10:20   Mr Laqueta Jean GE Contrast  09/08/2015   CLINICAL DATA:  81 year old male with left side weakness following cervical spine and lumbar surgery. Initial encounter.  EXAM: MRI HEAD WITHOUT CONTRAST AND WITH CONTRAST  MRA HEAD WITHOUT CONTRAST  MRA NECK WITHOUT AND WITH CONTRAST  TECHNIQUE: Multiplanar, multiecho pulse sequences of the brain and surrounding structures were obtained without intravenous contrast. Angiographic images of the Circle of Willis were obtained using MRA technique without intravenous contrast. Angiographic images of the neck were obtained using MRA technique without and with intravenous contrast. Carotid stenosis measurements (when applicable) are obtained utilizing NASCET criteria, using the distal internal carotid diameter as the denominator.  CONTRAST:  20mL MULTIHANCE GADOBENATE DIMEGLUMINE 529 MG/ML IV SOLN  COMPARISON:  Head CT without contrast 09/06/2015. Preoperative Cervical spine MRI 08/12/2015  FINDINGS: MRI HEAD FINDINGS  Confluent restricted diffusion throughout most of the right hemisphere. Near complete involvement of the right MCA territory. Right MCA/PCA watershed involvement extending to the lateral occipital pole. Scattered involvement elsewhere in the right PCA territory. Some of the right temporal lobe also is spared.  Scattered small areas of restricted diffusion also in the left MCA territory, an the right superior cerebellar artery territory.  Major intracranial vascular flow voids are preserved, see MRA findings below.  Confluent cytotoxic edema in the right hemisphere. Areas of petechial hemorrhage in the anterior and posterior right MCA territories. Mass effect on the right lateral ventricle. Mild leftward midline shift of 3 mm. No ventriculomegaly. Basilar cisterns are patent. No extra-axial or intraventricular hemorrhage. Following contrast, no significant post ischemic enhancement.  Minimal other nonspecific cerebral  white matter T2 and FLAIR hyperintensity. There is a small chronic lacunar infarct in the right PICA territory (series 6, image 5).  Visible internal auditory structures appear normal. Trace mastoid fluid. Mild paranasal sinus mucosal thickening. The patient is intubated. Trace fluid in the oral cavity. Postoperative changes to the globes. Negative scalp soft tissues.  Hardware susceptibility artifact in the cervical spine. Chronic spinal Stenosis with mass  effect on the spinal cord at C2-C3 (series 3, image 13).  MRA NECK FINDINGS  Precontrast time-of-flight imaging. A antegrade flow in both carotid and vertebral arteries in the neck and to the skullbase.  Post-contrast neck MRA images. Three vessel arch configuration. No great vessel origin stenosis.  Negative right CCA proximal to the right carotid bifurcation. At the right ICA origin there is irregularity and stenosis of up to 60 % with respect to the distal vessel (series 1302, image 84 and series 16109, image 5). The cervical right ICA is patent and otherwise appears negative to the skullbase.  Negative left CCA. Irregularity and stenosis at the left ICA origin also measuring up to 60 % with respect to the distal vessel and best seen on series 1302, image 71. The left ICA remains patent and is otherwise negative to the skullbase.  No proximal subclavian artery stenosis. Normal left vertebral artery origin. Mild if any stenosis at the right vertebral artery origin. The left vertebral artery is mildly dominant throughout.  MRA HEAD FINDINGS  Antegrade flow in the posterior circulation. Dominant distal left vertebral artery. Normal right PICA origin. Dominant appearing left AICA with a patent origin. Patent vertebrobasilar junction. No basilar stenosis. Normal SCA and PCA origins, fetal type on the right. Both posterior communicating arteries are present. Bilateral PCA branches are within normal limits.  Antegrade flow in both ICA siphons. No siphon stenosis.  Ophthalmic and posterior communicating artery origins are within normal limits. Small probable atherosclerotic pseudo lesion of the left ICA siphon in the superior hypophyseal region (series 505, image 11). Patent carotid termini. Normal MCA and ACA origins.  Anterior communicating artery and visualized ACA branches are within normal limits. Left MCA M1 segment and bifurcation are within normal limits. No left MCA branch occlusion identified. Right MCA M1 segment and bifurcation are within normal limits. No major right MCA branch occlusion identified.  IMPRESSION: 1. 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 2. Scattered small left MCA and occasional right SCA acute infarcts. 3. Bilateral ICA origin stenosis of up to 60% in the neck. No emergent large vessel occlusion. No intracranial stenosis or MCA branch occlusion identified. 4. Chronic spinal stenosis with spinal cord mass effect at C2-C3. New upper cervical fusion hardware.   Electronically Signed   By: Odessa Fleming M.D.   On: 09/08/2015 10:20   Dg Chest Port 1 View  09/09/2015   CLINICAL DATA:  Acute respiratory failure  EXAM: PORTABLE CHEST - 1 VIEW  COMPARISON:  09/08/2015  FINDINGS: Low lung volumes are present, causing crowding of the pulmonary vasculature. Airspace opacities at the left lung base and left infrahilar region, slightly worsened. Minimal right basilar airspace opacity medially.  Thoracic spondylosis. Borderline enlargement of the cardiopericardial silhouette.  Endotracheal and nasogastric tubes have been removed.  IMPRESSION: 1. Bibasilar airspace opacities, left greater than right, mildly worsened. 2. Borderline enlargement of the cardiopericardial silhouette.   Electronically Signed   By: Gaylyn Rong M.D.   On: 09/09/2015 08:08    Assessment/Plan: Diagnosis: Cervical stenosis with myelopathy s/p ACDF with  post-op Right MCA infarct 1. Does the need for close, 24 hr/day medical supervision in concert with the patient's rehab needs make it unreasonable for this patient to be served in a less intensive setting? Yes 2. Co-Morbidities requiring supervision/potential complications: delirium, dysphagia 3. Due to bladder management, bowel management, safety, skin/wound care,  disease management, medication administration, pain management and patient education, does the patient require 24 hr/day rehab nursing? Yes 4. Does the patient require coordinated care of a physician, rehab nurse, PT (1-2 hrs/day, 5 days/week), OT (1-2 hrs/day, 5 days/week) and SLP (1-2 hrs/day, 5 days/week) to address physical and functional deficits in the context of the above medical diagnosis(es)? Yes Addressing deficits in the following areas: balance, endurance, locomotion, strength, transferring, bowel/bladder control, bathing, dressing, feeding, grooming, toileting, cognition, speech, language, swallowing and psychosocial support 5. Can the patient actively participate in an intensive therapy program of at least 3 hrs of therapy per day at least 5 days per week? Potentially 6. The potential for patient to make measurable gains while on inpatient rehab is good 7. Anticipated functional outcomes upon discharge from inpatient rehab are supervision and min assist  with PT, min assist to mod assist with OT, supervision and min assist with SLP. 8. Estimated rehab length of stay to reach the above functional goals is: 14-20 days to decrease burden of care 9. Does the patient have adequate social supports and living environment to accommodate these discharge functional goals? Potentially 10. Anticipated D/C setting: Home 11. Anticipated post D/C treatments: HH therapy and Outpatient therapy 12. Overall Rehab/Functional Prognosis: excellent  RECOMMENDATIONS: This patient's condition is appropriate for continued rehabilitative care in the  following setting: CIR to decrease burden of care before transition to SNF Patient has agreed to participate in recommended program. N/A Note that insurance prior authorization may be required for reimbursement for recommended care.  Comment: Social supports appear scarce. See above. Rehab Admissions Coordinator to follow up.  Thanks,  Ranelle Oyster, MD, [place] [facility]     09/10/2015

## 2015-09-10 NOTE — Progress Notes (Signed)
Nutrition Follow-up  INTERVENTION:   Initiate Jevity 1.2 @ 60 ml/hr via feeding tube   30 ml Prostat BID.    Tube feeding regimen provides 1928 kcal, 110 grams of protein, and 1166 ml of H2O.   NUTRITION DIAGNOSIS:   Inadequate oral intake related to inability to eat as evidenced by NPO status. Ongoing  GOAL:   Patient will meet greater than or equal to 90% of their needs Not yet met.   MONITOR:   TF tolerance, Labs, I & O's, Weight trends  REASON FOR ASSESSMENT:   Consult Enteral/tube feeding initiation and management  ASSESSMENT:   81 year old male with PMH DM, HTN, GERD who had a posterior approach laminectomy then post op patient was unresponsive with facial swelling and a rash so decision was made to leave patient intubated.  Pt extubated 9/21. Noted to have large MCA and small PCA infarct. Failed swallow evaluation 9/22 and 9/23.  Consult received to start enteral nutrition.  Feeding tube placed by IR due to hardware in neck: tip at ligament of Treitz  Spoke with RN.   Diet Order:  Diet NPO time specified  Skin:  Reviewed, no issues  Last BM:  9/19  Height:   Ht Readings from Last 1 Encounters:  09/02/15 _0  (1.854 m)   Weight:   Wt Readings from Last 1 Encounters:  09/08/15 199 lb 15.3 oz (90.7 kg)   Ideal Body Weight:  83.6 kg  BMI:  Body mass index is 26.39 kg/(m^2).  Estimated Nutritional Needs:   Kcal:  1900-2100  Protein:  100-115 grams  Fluid:  > 1.9 L/day  EDUCATION NEEDS:   No education needs identified at this time  [place], [place], [place] Pager [phone] After Hours Pager

## 2015-09-10 NOTE — Procedures (Signed)
Objective Swallowing Evaluation:  (mbs)  Patient Details  Name: [name] MRN: [id number] Date of Birth: July 27, 1930  Today's Date: 09/10/2015 Time: SLP Start Time (ACUTE ONLY): 1130-SLP Stop Time (ACUTE ONLY): 1150 SLP Time Calculation (min) (ACUTE ONLY): 20 min  Past Medical History:  Past Medical History  Diagnosis Date  . Hypertension   . Diabetes mellitus without complication   . DDD (degenerative disc disease)   . Vertigo   . High cholesterol   . Peripheral vascular disease   . Pneumonia   . History of kidney stones   . Hx of gallstones   . GERD (gastroesophageal reflux disease)   . Headache   . Wears dentures    Past Surgical History:  Past Surgical History  Procedure Laterality Date  . Cholecystectomy    . Skin graft      Left leg  . Vein surgery    . Diagnostic laparoscopy    . Multiple tooth extractions    . Vascular surgery    . Eye surgery Bilateral   . Anterior cervical decomp/discectomy fusion N/A 09/06/2015    Procedure: Anterior Cervical Decompression Fusion Cervical two,Cervical three,Cervical four-,Cervical five ;  Surgeon: Aaron Sicks, MD;  Location: [facility];  Service: Neurosurgery;  Laterality: N/A;  . Lumbar laminectomy/decompression microdiscectomy N/A 09/06/2015    Procedure: Lumbar three-four  decompresssive laminectomy  ;  Surgeon: Aaron Sicks, MD;  Location: [facility];  Service: Neurosurgery;  Laterality: N/A;   HPI:  Other Pertinent Information: [name] is an 81 y.o. male with a past medical history significant for HTN, DM, hypercholesterolemia, PAD, GERD, who underwent C2-3 and C4-5 anterior cervical discectomy with interbody fusion as well as L3-4 decompressive laminectomy with bilateral L3 and L4 foraminotomies on 9/19, and few hours postoperatively was noted to have significant left hemiparesis that prompted CT brain which demonstrated findings concerning for acute ischemia in the right MCA territory. Pt remained on the vent  post-operatively 9/19-9/21.  No Data Recorded  Assessment / Plan / Recommendation CHL IP CLINICAL IMPRESSIONS 09/10/2015  Therapy Diagnosis Severe pharyngeal phase dysphagia  Clinical Impression Pt presents with a severe pharyngeal dysphagia secondary to edema s/p ACDF surgery, acting to narrow A-P pharyngeal space and preventing epiglottic closure over larynx.  This mechanical deficit is compounded by sensorimotor deficits brought about by R CVA - there is reduced mobility of the hyolaryngeal complex, leading to residuals of all tested POs within pharynx.  Pt noted with moderate aspiration; there is preserved cough response, but it does not eject aspirate from airway.  Recommend continuing NPO for now; consider temporary enteral feeding.  D/W RN; who will page MD re: placing NG under fluoro (may be difficult to pass at bedside given reduced ability to swallow and limited UES opening/edema. ) SLP will follow for therapeutic exercises, readiness for POs.        CHL IP TREATMENT RECOMMENDATION 09/10/2015  Treatment Recommendations Therapy as outlined in treatment plan below     CHL IP DIET RECOMMENDATION 09/10/2015  SLP Diet Recommendations NPO  Liquid Administration via (None)  Medication Administration Via alternative means  Compensations (None)  Postural Changes and/or Swallow Maneuvers (None)     CHL IP OTHER RECOMMENDATIONS 09/10/2015  Recommended Consults (None)  Oral Care Recommendations Oral care QID  Other Recommendations (None)     No flowsheet data found.   CHL IP FREQUENCY AND DURATION 09/10/2015  Speech Therapy Frequency (ACUTE ONLY) min 3x week  Treatment Duration 2 weeks  [name], [place] CCC/SLP Pager [phone]   [name] 09/10/2015, 12:23 PM

## 2015-09-10 NOTE — Progress Notes (Addendum)
PULMONARY / CRITICAL CARE MEDICINE   Name: [name] MRN: [id number] DOB: October 27, 1930    ADMISSION DATE:  09/06/2015 CONSULTATION DATE:  09/06/15  REFERRING MD :  Jordan Likes  CHIEF COMPLAINT:  UE and LE numbness  INITIAL PRESENTATION:  81 y.o. M taken to OR 9/19 with Dr. Jordan Likes of neurosurgery for anterior discectomy / fusion and lumbar laminectomy.  Post op, remained on the vent.  PCCM called for vent management.    STUDIES:  CT head (9/19)- Suggestive of rt MCA infarct. MRU (9/21)- Large MCA and small PCA infarct.  SIGNIFICANT EVENTS: 9/19 - to OR for C2-C3 and C4-5 anterior anterior cervical discectomy with interbody fusion, anterior instrumentation at C2-3 and C4-5, L3-4 decompressive laminectomy with bilateral L3 and l4 foraminotomies.  Returned to ICU on vent. 9/21- Extubated.  HISTORY OF PRESENT ILLNESS:  Pt is encephalopathic; therefore, this HPI is obtained from chart review. [name] is a 81 y.o. M with PMH as outlined below.  He has had progressive bilateral upper extremity and lower extremity numbness for which he was evaluated by Dr. Jordan Likes of neurosurgery.  Workup demonstrated evidence of critical stenosis at C2-3 and C4-5 as well as lumbar stenosis at L3-4 and L4-5.  On 9/19, he was taken to the OR for C2-C3 and C4-5 anterior anterior cervical discectomy with interbody fusion, anterior instrumentation at C2-3 and C4-5, L3-4 decompressive laminectomy with bilateral L3 and l4 foraminotomies.  Following the procedure, he remained on the ventilator and PCCM was consulted for vent management.  Of note, pt was in prone positioning for part of the procedure and upon OR staff turning him supine, they noted some facial and possible tongue edema.  There was a concern for possible angioedema.  In addition, neurosurgery had mentioned that they were concerned for new CVA given that pt was not moving left leg.  PAST MEDICAL HISTORY :   has a past medical history of Hypertension;  Diabetes mellitus without complication; DDD (degenerative disc disease); Vertigo; High cholesterol; Peripheral vascular disease; Pneumonia; History of kidney stones; gallstones; GERD (gastroesophageal reflux disease); Headache; and Wears dentures.  has past surgical history that includes Cholecystectomy; Skin graft; Vein Surgery; Diagnostic laparoscopy; Multiple tooth extractions; Vascular surgery; Eye surgery (Bilateral); Anterior cervical decomp/discectomy fusion (N/A, 09/06/2015); and Lumbar laminectomy/decompression microdiscectomy (N/A, 09/06/2015). Prior to Admission medications   Medication Sig Start Date End Date Taking? Authorizing Provider  aspirin EC 81 MG tablet Take 81 mg by mouth daily.   Yes Historical Provider, MD  folic acid (FOLVITE) 1 MG tablet Take 1 mg by mouth daily.  06/01/15  Yes Historical Provider, MD  hydrochlorothiazide (HYDRODIURIL) 25 MG tablet Take 25 mg by mouth daily as needed (for leg swelling).  07/14/15  Yes Historical Provider, MD  metFORMIN (GLUCOPHAGE) 500 MG tablet Take 500 mg by mouth 2 (two) times daily.   Yes Historical Provider, MD  methotrexate 2.5 MG tablet Take 20 mg by mouth every Saturday. Takes every saturday   Yes Historical Provider, MD  propranolol (INDERAL) 80 MG tablet Take 80 mg by mouth daily.   Yes Historical Provider, MD  rosuvastatin (CRESTOR) 10 MG tablet Take 10 mg by mouth daily.   Yes Historical Provider, MD  telmisartan (MICARDIS) 80 MG tablet Take 80 mg by mouth daily.   Yes Historical Provider, MD  oxyCODONE-acetaminophen (PERCOCET/ROXICET) 5-325 MG per tablet Take 1 tablet by mouth every 4 (four) hours as needed for pain.    Historical Provider, MD   Allergies  Allergen  Reactions  . Penicillins Other (See Comments)    unknown  . Adhesive [Tape] Rash    FAMILY HISTORY:  History reviewed. No pertinent family history.  SOCIAL HISTORY:  reports that he has quit smoking. He does not have any smokeless tobacco history on file. He  reports that he does not drink alcohol or use illicit drugs.  REVIEW OF SYSTEMS:  Unable to obtain as pt is encephalopathic.  SUBJECTIVE: More awake today.  VITAL SIGNS: Temp:  [97.1 F (36.2 C)-99 F (37.2 C)] 97.9 F (36.6 C) (09/23 0400) Pulse Rate:  [69-95] 75 (09/23 0600) Resp:  [14-31] 24 (09/23 0600) BP: (151-191)/(70-100) 158/72 mmHg (09/23 0600) SpO2:  [87 %-97 %] 90 % (09/23 0600) HEMODYNAMICS:   VENTILATOR SETTINGS:   INTAKE / OUTPUT: Intake/Output      09/22 0701 - 09/23 0700 09/23 0701 - 09/24 0700   I.V. (mL/kg) 1950 (21.5)    IV Piggyback 200    Total Intake(mL/kg) 2150 (23.7)    Urine (mL/kg/hr) 3725 (1.7)    Total Output 3725     Net -1575            PHYSICAL EXAMINATION: General: Adult male, in no distress Neuro: Awake, Responds to commands. Cannot move left side. HEENT: PERRLA, EOMI. Cardiovascular: RRR, no M/R/G.  Lungs: Respirations even and unlabored.  CTA bilaterally, No W/R/R. Abdomen: BS x 4, soft, NT/ND.  Musculoskeletal: No gross deformities, no edema.  Skin: Intact, warm, no rashes.  LABS:  CBC  Recent Labs Lab 09/08/15 0236 09/09/15 0242 09/10/15 0242  WBC 8.5 6.3 7.7  HGB 10.7* 11.1* 12.0*  HCT 32.0* 32.8* 34.5*  PLT 92* 83* 102*   Coag's No results for input(s): APTT, INR in the last 168 hours. BMET  Recent Labs Lab 09/08/15 0236 09/09/15 0242 09/10/15 0242  NA 136 138 139  K 4.0 3.8 3.3*  CL 107 109 105  CO2 BUN 22* 20 18  CREATININE 1.08 0.82 0.69  GLUCOSE 144* 129* 123*   Electrolytes  Recent Labs Lab 09/07/15 0233 09/08/15 0236 09/09/15 0242 09/10/15 0242  CALCIUM 8.0* 8.0* 8.1* 8.1*  MG 1.3*  --   --   --   PHOS 2.8  --   --   --    Sepsis Markers  Recent Labs Lab 09/06/15 1625  LATICACIDVEN 2.5*   ABG  Recent Labs Lab 09/06/15 1600  PHART 7.402  PCO2ART 40.1  PO2ART 103*   Liver Enzymes  Recent Labs Lab 09/06/15 1635  AST 22  ALT 12*  ALKPHOS 73  BILITOT 1.3*   ALBUMIN 2.8*   Cardiac Enzymes  Recent Labs Lab 09/06/15 1635  TROPONINI <0.03   Glucose  Recent Labs Lab 09/09/15 1144 09/09/15 1712 09/09/15 1955 09/10/15 0012 09/10/15 0436 09/10/15 0816  GLUCAP 121* 119* 147* 119* 123* 95    Imaging CXR (9/22) Stable bibasilar opacities.   ASSESSMENT / PLAN:  NEUROLOGIC A:   Acute metabolic encephalopathy. Critical stenosis at C2-3 and C4-5 as well as lumbar stenosis at L3-4 and L4-5 - s/p C2-C3 and C4-5 anterior anterior cervical discectomy with interbody fusion, anterior instrumentation at C2-3 and C4-5, L3-4 decompressive laminectomy with bilateral L3 and l4 foraminotomies (09/06/15 by Dr. Jordan Likes). Concern for new infarct per neurosurgery. Hx DDD, vertigo, headaches. P:   Post op care per neurosurgery. On vancomycin for post surgery prophylaxis  PULMONARY OETT 9/19 > 9/21 A: VDRF following surgery 9/19. ? Angioedema after surgery P:   Wean  down Fio2 as tolerated. Incentive spirometry.  CARDIOVASCULAR A:  Hypotension post op. Hx HTN, HLD, PVD. P:  Stable. BP goals as per neuro. NS at 75 cc/hr  RENAL A:   No acute issues - no labs since 9/15. P:   Correct electrolytes as indicated.   GASTROINTESTINAL A:   GERD. Nutrition. P:  SUP: Pantoprazole. NPO. Speech eval. Will need re assessment  today  HEMATOLOGIC A:   VTE Prophylaxis. P:  SCD's.   INFECTIOUS A:   Surgical prophylaxis. P:   Monitor clinically.  ENDOCRINE A:   DM. P:   SSI. Hold outpatient metformin.  Stable for transfer out of ICU  Family updated: None.  Interdisciplinary Family Meeting v Palliative Care Meeting:  Due by: 9/25.   Chilton Greathouse MD [facility] Pager [phone] If no answer or after 3pm call: [phone] 09/10/2015, 8:35 AM

## 2015-09-10 NOTE — Progress Notes (Signed)
Physical Therapy Treatment Patient Details Name: [name] MRN: [id number] DOB: [birth year] Today's Date: 09/10/2015    History of Present Illness pt is an 81 y/o male admitted with servere stenosis in cervical and lumbar areas with assoc bil UE and LE's, s/p C23 and C45 fusions and decompressions AND one level lumbar lami and foraminectomy.  After sx noted pt with significant L hemiparesis with some inattension and R gaze preference.  MRI shows R large MCA and smaller PCA infarcts; ?L infarct    PT Comments    Progressing slowly.  Significant neglect continues, need for redirection to task.  Decreased awareness of his deficits.  Emphasis on sitting balance hindered by pushing behaviors, standing and transfers to chair.  Follow Up Recommendations  CIR     Equipment Recommendations  Other (comment) (TBA next venue)    Recommendations for Other Services Rehab consult     Precautions / Restrictions Precautions Precautions: Fall    Mobility  Bed Mobility Overal bed mobility: Needs Assistance;+2 for physical assistance Bed Mobility: Rolling;Sidelying to Sit Rolling: Max assist;+2 for physical assistance Sidelying to sit: Max assist;+2 for safety/equipment       General bed mobility comments: cues and physical assist throughout the movement  Transfers Overall transfer level: Needs assistance Equipment used: 2 person hand held assist Transfers: Sit to/from [employer] Sit to Stand: Max assist;+2 physical assistance Stand pivot transfers: Total assist;+2 physical assistance       General transfer comment: significant assist forward and to power up, but once initiated pt assisted with R LE  Ambulation/Gait                 Stairs            Wheelchair Mobility    Modified Rankin (Stroke Patients Only) Modified Rankin (Stroke Patients Only) Pre-Morbid Rankin Score: Moderate disability Modified Rankin: Severe disability     Balance  Overall balance assessment: Needs assistance Sitting-balance support: Feet supported;Single extremity supported Sitting balance-Leahy Scale: Poor Sitting balance - Comments: sat EOB x10 min or more working on balance, truncal coordination, inhibiting pushing behaviors from R UE   Standing balance support: No upper extremity supported;Bilateral upper extremity supported Standing balance-Leahy Scale: Poor Standing balance comment: full support of L knee and L side x2 standing trials.                    Cognition Arousal/Alertness: Awake/alert;Lethargic Behavior During Therapy: Flat affect Overall Cognitive Status: Impaired/Different from baseline Area of Impairment: Orientation;Attention;Following commands;Safety/judgement;Awareness;Problem solving Orientation Level: Situation;Time Current Attention Level: Sustained   Following Commands: Follows one step commands inconsistently;Follows one step commands with increased time Safety/Judgement: Decreased awareness of safety;Decreased awareness of deficits Awareness: Intellectual Problem Solving: Slow processing;Decreased initiation;Difficulty sequencing;Requires verbal cues;Requires tactile cues      Exercises      General Comments        Pertinent Vitals/Pain Pain Assessment: Faces Faces Pain Scale: No hurt    Home Living                      Prior Function            PT Goals (current goals can now be found in the care plan section) Acute Rehab PT Goals Patient Stated Goal: no goals stated PT Goal Formulation: With patient Time For Goal Achievement: 09/23/15 Potential to Achieve Goals: Good Progress towards PT goals: Progressing toward goals    Frequency  Min 4X/week  PT Plan Current plan remains appropriate    Co-evaluation PT/OT/SLP Co-Evaluation/Treatment: Yes Reason for Co-Treatment: Complexity of the patient's impairments (multi-system involvement);For patient/therapist safety PT goals  addressed during session: Mobility/safety with mobility       End of Session   Activity Tolerance: Patient tolerated treatment well;Patient limited by fatigue Patient left: in chair;with call bell/phone within reach;Other (comment) (on maxilift pad)     Time: 1355-1440 PT Time Calculation (min) (ACUTE ONLY): 45 min  Charges:  $Therapeutic Activity: 23-37 mins                    G Codes:      [place]tinger, Eliseo Gum 09/10/2015, 2:59 PM 09/10/2015  [place] Bing, PT [phone]  (pager)

## 2015-09-10 NOTE — Progress Notes (Signed)
*  Preliminary Results* Bilateral lower extremity venous duplex completed. Visualized veins of bilateral lower extremities are negative for deep vein thrombosis. There is no evidence of Baker's cyst bilaterally.  09/10/2015  Gertie Fey, RVT, RDCS, RDMS

## 2015-09-10 NOTE — Progress Notes (Signed)
STROKE TEAM PROGRESS NOTE   SUBJECTIVE (INTERVAL HISTORY) No family is at the bedside.  Rehab PA is present for evaluation of CIR placement. Overall his condition is stable. Awake alert but still dysarthria right gaze and left hemiplegia. On C-collar.   OBJECTIVE Temp:  [97.1 F (36.2 C)-99 F (37.2 C)] 97.9 F (36.6 C) (09/23 0400) Pulse Rate:  [69-95] 75 (09/23 0600) Cardiac Rhythm:  [-] Normal sinus rhythm (09/22 1945) Resp:  [14-31] 24 (09/23 0600) BP: (151-191)/(70-100) 158/72 mmHg (09/23 0600) SpO2:  [87 %-97 %] 90 % (09/23 0600)    CBC:   Recent Labs Lab 09/09/15 0242 09/10/15 0242  WBC 6.3 7.7  HGB 11.1* 12.0*  HCT 32.8* 34.5*  MCV 88.6 87.6  PLT 83* 102*    Basic Metabolic Panel:   Recent Labs Lab 09/07/15 0233  09/09/15 0242 09/10/15 0242  NA 136  < > 138 139  K 4.0  < > 3.8 3.3*  CL 104  < > 109 105  CO2 24  < > 26 28  GLUCOSE 139*  < > 129* 123*  BUN 14  < > 20 18  CREATININE 1.05  < > 0.82 0.69  CALCIUM 8.0*  < > 8.1* 8.1*  MG 1.3*  --   --   --   PHOS 2.8  --   --   --   < > = values in this interval not displayed.  Lipid Panel:     Component Value Date/Time   CHOL 76 09/08/2015 0236   TRIG 53 09/08/2015 0236   HDL 24* 09/08/2015 0236   CHOLHDL 3.2 09/08/2015 0236   VLDL 11 09/08/2015 0236   LDLCALC 41 09/08/2015 0236   HgbA1c:  Lab Results  Component Value Date   HGBA1C 6.1* 09/07/2015   Urine Drug Screen: No results found for: LABOPIA, COCAINSCRNUR, LABBENZ, AMPHETMU, THCU, LABBARB    IMAGING I have personally reviewed the radiological images below and agree with the radiology interpretations.  Dg Cervical Spine 1 View 09/06/2015    ACDF C2-3 and C4-5  Ct Head Wo Contrast 09/06/2015   Chronic atrophic changes.  Generalized decreased attenuation in the distribution of the right middle cerebral artery suggestive of early ischemia. MRI may be helpful for further evaluation.    Dg Lumbar Spine 1 View 09/06/2015    Metallic  marker noted posteriorly at the L4-L5 level.     Dg Chest Port 1 View 09/06/2015    Lines and tubes as described above.    Dg C-arm 1-60 Min 09/06/2015    ACDF C2-3 and C4-5.      PHYSICAL EXAM Elderly Caucasian male in mild distress. Afebrile. Head is nontraumatic. Neck is supple without bruit. Cardiac exam no murmur or gallop. Lungs are clear to auscultation. Distal pulses are well felt. Neurological Exam :  Awake alert, AAOx2, not orientated to place. Still on C-collar. Speech is dyarthric and no dysarthria. Right gaze not able to past midline. Blinks to threat on the right but not on the left. 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 Corneal reflexes are present. Left lower facial weakness. Tongue midline. Moves right upper and lower extremity purposefully well against gravity without weakness. Left upper extremity is plegic. Left lower extremity can withdraw on pain stimulation but not against gravity. Left hemi-neglect present. Unable to recognize his own left hand Decreased sensation on the left hemibody. Tone is increased on the left. Bilateral babinski.  ASSESSMENT/PLAN [name] is a 81 y.o. male with history of HTN, DM, hypercholesterolemia, PAD, GERD, who underwent C2-3 and C4-5 anterior cervical discectomy with interbody fusion as well as L3-4 decompressive laminectomy with bilateral L3 and L4 foraminotomies on 9/19 consulted for left hemiparesis post op.  He did not receive IV t-PA due to recent surgery.  Stroke:  Large right MCA, scattered right PCA as well as punctate left MCA and right SCA infarcts of embolic etiology, concerning for relation of anterior neck manipulation causing b/l ICA embolism in the setting of b/l ICA proximal athero and stenosis with robust right PCOM, although cardioembolic can not be completely ruled out.  Resultant left hemiparesis, left hemi-neglect and left gaze palsy  MRI    Large right MCA and partial right PCA infarcts and scattered small left MCA and occasional right SCA acute infarcts  MRA  head and neck - Bilateral ICA origin stenosis of up to 60% in the neck. Prominent right PCOM  Carotid Doppler  Findings consistent with 40 - 59 percent stenosis involving the b/l ICAs  2D EchoLeft ventricle: EF 60% to 65%.   LDL 41 mg  HgbA1c 6.1   VTE prophylaxis subq heparin Diet NPO time specified  aspirin 81 mg orally every day prior to admission, now on aspirin 300 mg suppository daily. Recommend dual antiplatelet once have po access. ASA and plavix for 3 months and then plavix alone.   Ongoing aggressive stroke risk factor management  Therapy recommendations: Pending  Disposition: Pending  Hypertension  Stable BP normotensive  Hyperlipidemia  Home meds: Crestor 10 mg daily    LDL 41, goal < 70  Please resume crestor once po access  Continue statin at discharge  Diabetes  HgbA1c 6.1, goal < 7.0  Controlled  SSI  Tobacco abuse  Current smoker  Smoking cessation counseling provided  Other Stroke Risk Factors  Advanced age  Other Active Problems  Status post lumbar and cervical surgeries 09/06/2015  Hospital day # 4  This patient is critically ill due to right large MCA infarct, embolic stroke, and carotid stenosis bilaterally and at significant risk of neurological worsening, death form recurrent stroke, and hemorrhagic transformation. This patient's care requires constant monitoring of vital signs, hemodynamics, respiratory and cardiac monitoring, review of multiple databases, neurological assessment, discussion with family, other specialists and medical decision making of high complexity. I spent 35 minutes of neurocritical care time in the care of this patient.  Neurology will sign off. Please call with questions. Pt will follow up with Dr. Pearlean Brownie at [facility] in about 2 months. Thanks for the consult.  Marvel Plan, MD PhD Stroke  Neurology 09/10/2015 9:27 AM   To contact Stroke Continuity provider, please refer to [email or web address]. After hours, contact General Neurology

## 2015-09-10 NOTE — Care Management Important Message (Signed)
Important Message  Patient Details  Name: [name] MRN: [id number] Date of Birth: June 24, 1930   Medicare Important Message Given:  Yes-second notification given    Bernadette Hoit 09/10/2015, 10:45 AM

## 2015-09-10 NOTE — Progress Notes (Signed)
Speech Language Pathology Treatment: Dysphagia  Patient Details Name: [name] MRN: [id number] DOB: 1930/09/23 Today's Date: 09/10/2015 Time: 4098-1191 SLP Time Calculation (min) (ACUTE ONLY): 10 min  Assessment / Plan / Recommendation Clinical Impression  F/u after yesterday's clinical swallow evaluation.  Pt continues to demonstrate multiple s/s of dysphagia with aspiration risk when given limited POs.  Given dual etiologies that can impact integrity of swallow - ACDF and right CVA - recommend proceeding with MBS to determine current status and possibility of initiating POs.  Scheduled for this am. D/W RN.    HPI Other Pertinent Information: [name] is an 81 y.o. male with a past medical history significant for HTN, DM, hypercholesterolemia, PAD, GERD, who underwent C2-3 and C4-5 anterior cervical discectomy with interbody fusion as well as L3-4 decompressive laminectomy with bilateral L3 and L4 foraminotomies on 9/19, and few hours postoperatively was noted to have significant left hemiparesis that prompted CT brain which demonstrated findings concerning for acute ischemia in the right MCA territory. Pt remained on the vent post-operatively 9/19-9/21.   Pertinent Vitals    SLP Plan  MBS    Recommendations Diet recommendations: NPO              Oral Care Recommendations: Oral care QID Plan: MBS   Amanda L. Samson Fred[place], [place] CCC/SLP Pager [phone]      [name] 09/10/2015, 11:28 AM

## 2015-09-10 NOTE — Progress Notes (Signed)
OT Cancellation Note  Patient Details Name: [name] MRN: [id number] DOB: Mar 20, 1930   Cancelled Treatment:    Reason Eval/Treat Not Completed: Patient at procedure or test/ unavailable (Pt at [facility]. will return this pm if able.)  [facility] Ward, OTR/L  [phone] 09/10/2015 09/10/2015, 11:59 AM

## 2015-09-10 NOTE — Progress Notes (Signed)
Patient ID: [name], male   DOB: Jan 30, 1930, 81 y.o.   MRN: [id number] No complaints this morning some decided pain right upper extremity but otherwise stable  Strength 5 out of 5 on the right left hemiplegia incision clean dry and intact  Maintain cervical collar PT OT rehabilitation placement

## 2015-09-10 NOTE — Progress Notes (Signed)
Occupational Therapy Evaluation Patient Details Name: [name] MRN: [id number] DOB: 10-24-1930 Today's Date: 09/10/2015    History of Present Illness pt is an 81 y/o male admitted with servere stenosis in cervical and lumbar areas with assoc bil UE and LE's, s/p C23 and C45 fusions and decompressions AND one level lumbar lami and foraminectomy.  After sx noted pt with significant L hemiparesis with some inattension and R gaze preference.  MRI shows R large MCA and smaller PCA infarcts; ?L infarct   Clinical Impression   PTA, pt lived at home alone and was independent with ADL and mobility. Family states pt had started walking with a RW 2 weeks PTA due to leg weakness. Pt presents with significant deficits listed below and will benefit from acute OT to facilitate D/C to next venue of care. Pt will benefit from CIR; however, will most likely require extensive rehab due to L hemiplegia and L neglect and will need 24/7 assistance after D/C.     Follow Up Recommendations  CIR;Supervision/Assistance - 24 hour    Equipment Recommendations  3 in 1 bedside comode;Tub/shower bench;Hospital bed    Recommendations for Other Services Rehab consult     Precautions / Restrictions Precautions Precautions: Fall;Cervical;Back (back for comfort) Precaution Booklet Issued:  (need to give cervical precaution sheet) Required Braces or Orthoses: Cervical Brace Cervical Brace: Hard collar;At all times Restrictions Weight Bearing Restrictions: No      Mobility Bed Mobility Overal bed mobility: Needs Assistance Bed Mobility: Rolling;Sidelying to Sit;Sit to Sidelying Rolling: Max assist Sidelying to sit: Max assist     Sit to sidelying: Max assist General bed mobility comments: cues and physical assist throughout the movement  Transfers Overall transfer level: Needs assistance Equipment used: 2 person hand held assist Transfers: Sit to/from [employer] Sit to Stand: Max  assist;+2 physical assistance Stand pivot transfers: Total assist;+2 physical assistance       General transfer comment: Pt assisted with RLE. Appeared unaware of L side of body    Balance Overall balance assessment: Needs assistance   Sitting balance-Leahy Scale: Poor Sitting balance - Comments: posterior lean and L bias   Standing balance support: During functional activity Standing balance-Leahy Scale: Poor                              ADL Overall ADL's : Needs assistance/impaired Eating/Feeding: NPO   Grooming: Maximal assistance   Upper Body Bathing: Maximal assistance   Lower Body Bathing: Total assistance   Upper Body Dressing : Total assistance   Lower Body Dressing: Total assistance   Toilet Transfer: +2 for physical assistance;Maximal assistance   Toileting- Clothing Manipulation and Hygiene: Total assistance       Functional mobility during ADLs: +2 for physical assistance;Maximal assistance General ADL Comments: Unable to locate LUE when trying complete grooming task. Poor attention affecting midline control EOB     Vision Vision Assessment?: Vision impaired- to be further tested in functional context Additional Comments: Appears to demonstrate L field cut   Perception Perception Perception Tested?: Yes Perception Deficits: Inattention/neglect Inattention/Neglect: Does not attend to left visual field;Does not attend to left side of body Spatial deficits: dysmetric movements  R gaze preference; Will come to midline gaze. Able to look at therapist on L briefly. Trying to turn head in [place] collar.   Praxis Praxis Praxis tested?: Deficits (will further assess) Deficits: Initiation (delays with initation. ? minimal apraxia)  Pertinent Vitals/Pain Pain Assessment: Faces Faces Pain Scale: No hurt     Hand Dominance     Extremity/Trunk Assessment Upper Extremity Assessment Upper Extremity Assessment: LUE deficits/detail LUE Deficits  / Details: Brunstrom stage 1 - hand (flaccid0 and 2 arm (developing)adducts arm. Increaed extensor tone LUE Sensation: decreased light touch;decreased proprioception; deep pressure intact LUE Coordination: decreased fine motor;decreased gross motor (nonfunctional at this time)  Does not appear to recognize his L arm   Lower Extremity Assessment LLE Deficits / Details: some movement in synergy, gross extension 3-/5 LLE Sensation:  (inconsistent to light touch) LLE Coordination: decreased fine motor;decreased gross motor   Cervical / Trunk Assessment Cervical / Trunk Assessment: Other exceptions Cervical / Trunk Exceptions: posterior and L bias   Communication Communication Communication: No difficulties (watery voice)   Cognition Arousal/Alertness: Awake/alert Behavior During Therapy: Flat affect Overall Cognitive Status: Impaired/Different from baseline Area of Impairment: Attention;Following commands;Safety/judgement;Awareness;Orientation;Problem solving Orientation Level: Situation;Time Current Attention Level: Sustained   Following Commands: Follows one step commands with increased time Safety/Judgement: Decreased awareness of safety;Decreased awareness of deficits Awareness: Intellectual Problem Solving: Slow processing;Difficulty sequencing;Requires verbal cues;Requires tactile cues General Comments: apparent L neglect  Pt very sweet and talkative saying that he was wanting to "get going" Poor insight into deficits -Took Max A +2 to transfer pt into chair and pt thinks he can get into bed himself.    General Comments       Exercises       Shoulder Instructions  ? Minimal inferior L shoulder subluxation. Will need to watch closely and use kenisiotape if needed.    Home Living Family/patient expects to be discharged to:: Inpatient rehab   Available Help at Discharge: ?24/7?                                    Prior Functioning/Environment Level of  Independence: Independent with assistive device(s)        Comments: family lived close and assisted as needed    OT Diagnosis: Generalized weakness;Cognitive deficits;Disturbance of vision;Acute pain;Hemiplegia non-dominant side;Apraxia   OT Problem List: Decreased strength;Decreased range of motion;Impaired vision/perception;Impaired balance (sitting and/or standing);Decreased activity tolerance;Decreased coordination;Decreased cognition;Decreased safety awareness;Decreased knowledge of use of DME or AE;Decreased knowledge of precautions;Impaired sensation;Impaired tone;Impaired UE functional use;Pain;Increased edema   OT Treatment/Interventions: Self-care/ADL training;Neuromuscular education;DME and/or AE instruction;Therapeutic activities;Cognitive remediation/compensation;Visual/perceptual remediation/compensation;Patient/family education;Balance training    OT Goals(Current goals can be found in the care plan section) Acute Rehab OT Goals Patient Stated Goal: to get better OT Goal Formulation: With patient/family Time For Goal Achievement: 09/24/15 Potential to Achieve Goals: Good ADL Goals Pt Will Perform Grooming: sitting;with min assist Pt Will Perform Upper Body Bathing: with min assist;sitting Pt Will Transfer to Toilet: with min assist;with +2 assist;bedside commode;stand pivot transfer Additional ADL Goal #1: Maintain midline postural contro EOB x 10 min with minguard Additional ADL Goal #2: Locate 3/3 item L of midline with min vc  OT Frequency: Min 3X/week   Barriers to D/C:            Co-evaluation PT/OT/SLP Co-Evaluation/Treatment: Yes Reason for Co-Treatment: Complexity of the patient's impairments (multi-system involvement);Necessary to address cognition/behavior during functional activity;For patient/therapist safety          End of Session Equipment Utilized During Treatment: Gait belt;Oxygen;Cervical collar Nurse Communication: Mobility status;Other  (comment);Need for lift equipment (need for close monitoring)  Activity Tolerance: Patient tolerated treatment well Patient left:  in chair;with call bell/phone within reach   Time: 1355-1440 OT Time Calculation (min): 45 min Charges:  OT General Charges $OT Visit: 1 Procedure OT Evaluation $Initial OT Evaluation Tier I: 1 Procedure G-Codes:    Kayana Thoen,HILLARY 25-Sep-2015, 6:54 PM  [facility], OTR/L  [phone] 2015-09-25

## 2015-09-10 NOTE — Progress Notes (Signed)
PT Cancellation Note  Patient Details Name: [name] MRN: [id number] DOB: 03/20/1930   Cancelled Treatment:    Reason Eval/Treat Not Completed: Patient at procedure or test/unavailable.  Pt at [facility] on arrival.  Will see later as able. 09/10/2015  Benicia Bing, PT [phone]  (pager)   Mottinger, Eliseo Gum 09/10/2015, 12:00 PM

## 2015-09-11 ENCOUNTER — Encounter (HOSPITAL_COMMUNITY): Payer: Self-pay | Admitting: Cardiology

## 2015-09-11 ENCOUNTER — Inpatient Hospital Stay (HOSPITAL_COMMUNITY): Payer: Medicare Other

## 2015-09-11 DIAGNOSIS — I480 Paroxysmal atrial fibrillation: Secondary | ICD-10-CM

## 2015-09-11 DIAGNOSIS — I739 Peripheral vascular disease, unspecified: Secondary | ICD-10-CM

## 2015-09-11 DIAGNOSIS — R509 Fever, unspecified: Secondary | ICD-10-CM

## 2015-09-11 LAB — CK TOTAL AND CKMB (NOT AT ARMC)
CK, MB: 2 ng/mL (ref 0.5–5.0)
Relative Index: INVALID (ref 0.0–2.5)
Total CK: 58 U/L (ref 49–397)

## 2015-09-11 LAB — COMPREHENSIVE METABOLIC PANEL
ALBUMIN: 2.3 g/dL — AB (ref 3.5–5.0)
ALT: 18 U/L (ref 17–63)
AST: 24 U/L (ref 15–41)
Alkaline Phosphatase: 68 U/L (ref 38–126)
Anion gap: 6 (ref 5–15)
BUN: 16 mg/dL (ref 6–20)
CHLORIDE: 106 mmol/L (ref 101–111)
CO2: 27 mmol/L (ref 22–32)
Calcium: 8.2 mg/dL — ABNORMAL LOW (ref 8.9–10.3)
Creatinine, Ser: 0.76 mg/dL (ref 0.61–1.24)
GFR calc Af Amer: >60 mL/min (ref >60)
GFR calc non Af Amer: >60 mL/min (ref >60)
GLUCOSE: 141 mg/dL — AB (ref 65–99)
POTASSIUM: 3.5 mmol/L (ref 3.5–5.1)
Sodium: 139 mmol/L (ref 135–145)
Total Bilirubin: 1.7 mg/dL — ABNORMAL HIGH (ref 0.3–1.2)
Total Protein: 5.3 g/dL — ABNORMAL LOW (ref 6.5–8.1)

## 2015-09-11 LAB — CBC WITH DIFFERENTIAL/PLATELET
Basophils Absolute: 0 10*3/uL (ref 0.0–0.1)
Basophils Relative: 0 %
EOS ABS: 0 10*3/uL (ref 0.0–0.7)
EOS PCT: 0 %
HCT: 34.3 % — ABNORMAL LOW (ref 39.0–52.0)
Hemoglobin: 11.9 g/dL — ABNORMAL LOW (ref 13.0–17.0)
LYMPHS ABS: 0.6 10*3/uL — AB (ref 0.7–4.0)
LYMPHS PCT: 6 %
MCH: 30.4 pg (ref 26.0–34.0)
MCHC: 34.7 g/dL (ref 30.0–36.0)
MCV: 87.7 fL (ref 78.0–100.0)
MONO ABS: 1.1 10*3/uL — AB (ref 0.1–1.0)
Monocytes Relative: 11 %
Neutro Abs: 8.3 10*3/uL — ABNORMAL HIGH (ref 1.7–7.7)
Neutrophils Relative %: 83 %
PLATELETS: 95 10*3/uL — AB (ref 150–400)
RBC: 3.91 MIL/uL — ABNORMAL LOW (ref 4.22–5.81)
RDW: 14.9 % (ref 11.5–15.5)
WBC: 10 10*3/uL (ref 4.0–10.5)

## 2015-09-11 LAB — GLUCOSE, CAPILLARY
GLUCOSE-CAPILLARY: 193 mg/dL — AB (ref 65–99)
GLUCOSE-CAPILLARY: 151 mg/dL — AB (ref 65–99)
GLUCOSE-CAPILLARY: 130 mg/dL — AB (ref 65–99)
GLUCOSE-CAPILLARY: 117 mg/dL — AB (ref 65–99)
Glucose-Capillary: 154 mg/dL — ABNORMAL HIGH (ref 65–99)
Glucose-Capillary: 172 mg/dL — ABNORMAL HIGH (ref 65–99)

## 2015-09-11 LAB — BASIC METABOLIC PANEL
Anion gap: 8 (ref 5–15)
BUN: 17 mg/dL (ref 6–20)
CALCIUM: 7.8 mg/dL — AB (ref 8.9–10.3)
CHLORIDE: 102 mmol/L (ref 101–111)
CO2: 26 mmol/L (ref 22–32)
CREATININE: 0.73 mg/dL (ref 0.61–1.24)
GFR calc non Af Amer: >60 mL/min (ref >60)
GLUCOSE: 162 mg/dL — AB (ref 65–99)
Potassium: 3.3 mmol/L — ABNORMAL LOW (ref 3.5–5.1)
Sodium: 136 mmol/L (ref 135–145)

## 2015-09-11 LAB — MAGNESIUM: Magnesium: 1.6 mg/dL — ABNORMAL LOW (ref 1.7–2.4)

## 2015-09-11 LAB — CBC
HEMATOCRIT: 34.7 % — AB (ref 39.0–52.0)
HEMOGLOBIN: 11.9 g/dL — AB (ref 13.0–17.0)
MCH: 29.8 pg (ref 26.0–34.0)
MCHC: 34.3 g/dL (ref 30.0–36.0)
MCV: 87 fL (ref 78.0–100.0)
Platelets: 91 10*3/uL — ABNORMAL LOW (ref 150–400)
RBC: 3.99 MIL/uL — ABNORMAL LOW (ref 4.22–5.81)
RDW: 14.6 % (ref 11.5–15.5)
WBC: 9.1 10*3/uL (ref 4.0–10.5)

## 2015-09-11 LAB — TROPONIN I

## 2015-09-11 LAB — PHOSPHORUS: PHOSPHORUS: 1.8 mg/dL — AB (ref 2.5–4.6)

## 2015-09-11 MED ORDER — CETYLPYRIDINIUM CHLORIDE 0.05 % MT LIQD
7.0000 mL | Freq: Two times a day (BID) | OROMUCOSAL | Status: DC
  Administered 2015-09-11 – 2015-09-26 (×30): 7 mL via OROMUCOSAL

## 2015-09-11 MED ORDER — POTASSIUM CHLORIDE 20 MEQ/15ML (10%) PO SOLN
20.0000 meq | ORAL | Status: AC
  Administered 2015-09-11 (×2): 20 meq
  Filled 2015-09-11 (×2): qty 15

## 2015-09-11 MED ORDER — METOPROLOL TARTRATE 1 MG/ML IV SOLN
5.0000 mg | Freq: Four times a day (QID) | INTRAVENOUS | Status: DC
  Administered 2015-09-11 – 2015-09-13 (×5): 5 mg via INTRAVENOUS
  Filled 2015-09-11 (×5): qty 5

## 2015-09-11 MED ORDER — METOPROLOL TARTRATE 1 MG/ML IV SOLN
2.5000 mg | INTRAVENOUS | Status: DC | PRN
Start: 2015-09-11 — End: 2015-09-15
  Administered 2015-09-13: 5 mg via INTRAVENOUS
  Filled 2015-09-11: qty 5

## 2015-09-11 MED ORDER — CHLORHEXIDINE GLUCONATE 0.12 % MT SOLN
15.0000 mL | Freq: Two times a day (BID) | OROMUCOSAL | Status: DC
  Administered 2015-09-11 – 2015-09-27 (×32): 15 mL via OROMUCOSAL
  Filled 2015-09-11 (×27): qty 15

## 2015-09-11 NOTE — Consult Note (Addendum)
Cardiology Consult Note  Admit date: 09/06/2015 Name: [name] 81 y.o.  male DOB:  [birth year] MRN:  [id number]  Today's date:  09/11/2015  Referring Physician:  Dr. Donalee Citrin  Reason for Consultation:   Atrial fibrillation  IMPRESSIONS: 1.  New onset of paroxysmal atrial fibrillation occurring in the setting of fever as well as beta blocker withdrawal in the postoperative phase  CHA2DS2VASC score 5 2.  Recent right MCA stroke occurring postoperatively-unclear whether due to hypotension or embolus 3.  Diabetes mellitus 4.  Hypertension 5.  Peripheral vascular disease 6.  Recent spinal stenosis in the cervical level and at the lumbar level with cervical and lumbar decompression recently  RECOMMENDATION: He is not able to take oral medications right now.  Would recommend intravenous metoprolol on a continuous basis until he is able to take oral medicines or medicines by NG tube.  At that point I would restart his beta blocker therapy but change from propranolol to metoprolol.  In addition he will need to have his fever addressed.  If he goes back into atrial fibrillation would use continuous intravenous diltiazem.  The atrial fibrillation is concerning in light of his recent stroke.  Once he is able to recover from surgery will need to revisit the issues of whether he should be anticoagulated or not because of the atrial fibrillation and the stroke.  Evidently the stroke was not temporally related to atrial fibrillation.  HISTORY: This 81 year old male has a history of hypertension and diabetes and peripheral vascular disease as well as hyperlipidemia.  He was admitted with a progressive gait disorder and found to have cervical and lumbar spinal stenosis with disc herniation presented for 2 operations there.  He underwent these procedures on the 19th.  His operative course on the 19th was complicated by hypotension requiring Neo-Synephrine to maintain blood pressure and his propranolol  was held.  He had a PTT scan suggestive of a possible MCA distribution infarct.  Neurology was unclear as to whether is due to hypoperfusion or embolus.  Echocardiogram showed preserved LV function and moderate atrial enlargement.  Source of embolus was seen.  He became febrile this evening and went into atrial fibrillation around 6:40 PM.  He was given a dose of labetalol and cardiology was consulted.  Currently the patient is complaining only of back pain.  He converted back to sinus rhythm in the past half hour.  No prior history of atrial fibrillation.  No chest pain suggestive of angina.    Past Medical History  Diagnosis Date  . Hypertension   . Diabetes mellitus without complication   . DDD (degenerative disc disease)   . Vertigo   . High cholesterol   . Peripheral vascular disease   . Pneumonia   . History of kidney stones   . Hx of gallstones   . GERD (gastroesophageal reflux disease)   . Headache   . Wears dentures       Past Surgical History  Procedure Laterality Date  . Cholecystectomy    . Skin graft      Left leg  . Vein surgery    . Diagnostic laparoscopy    . Multiple tooth extractions    . Vascular surgery    . Eye surgery Bilateral   . Anterior cervical decomp/discectomy fusion N/A 09/06/2015    Procedure: Anterior Cervical Decompression Fusion Cervical two,Cervical three,Cervical four-,Cervical five ;  Surgeon: Julio Sicks, MD;  Location: [facility];  Service: Neurosurgery;  Laterality: N/A;  .  Lumbar laminectomy/decompression microdiscectomy N/A 09/06/2015    Procedure: Lumbar three-four  decompresssive laminectomy  ;  Surgeon: Julio Sicks, MD;  Location: [facility];  Service: Neurosurgery;  Laterality: N/A;     Allergies:  is allergic to penicillins and adhesive.   Medications: Prior to Admission medications   Medication Sig Start Date End Date Taking? Authorizing Provider  aspirin EC 81 MG tablet Take 81 mg by mouth daily.   Yes Historical Provider, MD   folic acid (FOLVITE) 1 MG tablet Take 1 mg by mouth daily.  06/01/15  Yes Historical Provider, MD  hydrochlorothiazide (HYDRODIURIL) 25 MG tablet Take 25 mg by mouth daily as needed (for leg swelling).  07/14/15  Yes Historical Provider, MD  metFORMIN (GLUCOPHAGE) 500 MG tablet Take 500 mg by mouth 2 (two) times daily.   Yes Historical Provider, MD  methotrexate 2.5 MG tablet Take 20 mg by mouth every Saturday. Takes every saturday   Yes Historical Provider, MD  propranolol (INNOPRAN XL) 80 MG 24 hr capsule Take 80 mg by mouth at bedtime.   Yes Historical Provider, MD  rosuvastatin (CRESTOR) 10 MG tablet Take 10 mg by mouth daily.   Yes Historical Provider, MD  telmisartan (MICARDIS) 80 MG tablet Take 80 mg by mouth daily.   Yes Historical Provider, MD  oxyCODONE-acetaminophen (PERCOCET/ROXICET) 5-325 MG per tablet Take 1 tablet by mouth every 4 (four) hours as needed for pain.    Historical Provider, MD    Family History: Family Status  Relation Status Death Age  . Mother Deceased     Unsure of medical history  . Father Deceased     Car accident    Social History:   reports that he has quit smoking. He has never used smokeless tobacco. He reports that he does not drink alcohol or use illicit drugs.   Social History   Social History Narrative   Lives at home with a caregiver.   Right-handed.   Occasional caffeine use.    Review of Systems: Complaining of low back pain currently, dysphagia unable to eat limited review of systems only.  Physical Exam: BP 161/68 mmHg  Pulse 123  Temp(Src) 101 F (38.3 C) (Axillary)  Resp 34  Ht  (1.854 m)  Wt 91.3 kg (201 lb 4.5 oz)  BMI 26.56 kg/m2  SpO2 95%  General appearance: He is an elderly male lying in the bed able to give history complaining of low back pain Head: Normocephalic, without obvious abnormality, atraumatic Eyes: conjunctivae/corneas clear. PERRL, EOM's intact. Fundi not examined  Neck: no adenopathy, no JVD,  supple, symmetrical, trachea midline and Cervical collar in place Lungs: Rhonchi bilaterally Heart: Current regular rate and rhythm, normal S1 and S2, no S3 Abdomen: soft, non-tender; bowel sounds normal; no masses,  no organomegaly Rectal: deferred Extremities: extremities normal, atraumatic, no cyanosis or edema Pulses: 2+ and symmetric Pedal pulses are somewhat diminished Neurologic: Left hemiparesis  Labs: CBC  Recent Labs  09/11/15 0220  WBC 9.1  RBC 3.99*  HGB 11.9*  HCT 34.7*  PLT 91*  MCV 87.0  MCH 29.8  MCHC 34.3  RDW 14.6   CMP   Recent Labs  09/11/15 1959  NA 139  K 3.5  CL 106  CO2 27  GLUCOSE 141*  BUN 16  CREATININE 0.76  CALCIUM 8.2*  PROT 5.3*  ALBUMIN 2.3*  AST 24  ALT 18  ALKPHOS 68  BILITOT 1.7*  GFRNONAA >60  GFRAA >60   Cardiac Panel (  last 3 results)  Recent Labs  09/11/15 1959  CKTOTAL 58  CKMB 2.0  TROPONINI <0.03  RELINDX RELATIVE INDEX IS INVALID     Radiology: Chest x-ray a couple of days ago showed some bibasilar opacities  EKG: EKG shows atrial fibrillation with right bundle branch block, somewhat rapid ventricular response  Signed:  W. Ashley Royalty MD [facility]   Cardiology Consultant  09/11/2015, 8:44 PM

## 2015-09-11 NOTE — Progress Notes (Signed)
[facility] Electrolyte Replacement Protocol  Patient Name: STEELE STRACENER DOB: [birth year] MRN: [id number]  Date of Service  09/11/2015   HPI/Events of Note    Recent Labs Lab 09/07/15 0233 09/08/15 0236 09/09/15 0242 09/10/15 0242 09/11/15 0220  NA 136 136 138 139 136  K 4.0 4.0 3.8 3.3* 3.3*  CL 104 107 109 105 102  CO2 24 25 26 28 26   GLUCOSE 139* 144* 129* 123* 162*  BUN 14 22* 20 18 17   CREATININE 1.05 1.08 0.82 0.69 0.73  CALCIUM 8.0* 8.0* 8.1* 8.1* 7.8*  MG 1.3*  --   --   --  1.6*  PHOS 2.8  --   --   --  1.8*    Estimated Creatinine Clearance: 79.1 mL/min (by C-G formula based on Cr of 0.73).  Intake/Output      09/23 0701 - 09/24 0700   I.V. (mL/kg) 1653 (18.1)   NG/GT 420   IV Piggyback 250   Total Intake(mL/kg) 2323 (25.4)   Urine (mL/kg/hr) 1950 (0.9)   Total Output 1950   Net +373       Urine Occurrence 1 x    - I/O DETAILED x24h    Total I/O In: 1120 [I.V.:750; NG/GT:370] Out: 500 [Urine:500] - I/O THIS SHIFT    ASSESSMENT   eICURN Interventions   ICU Electrolyte Replacement Protocol criteria met. Labs Replaced per protocol. MD notified   ASSESSMENT: MAJOR ELECTROLYTE    Lorene Dy 09/11/2015, 6:31 AM

## 2015-09-11 NOTE — Progress Notes (Signed)
Patient ID: [name], male   DOB: 05/05/1930, 81 y.o.   MRN: [id number] Patient voices no complaints.  Awake alert oriented strength 5 out of 5 in the right left hemi-plegia cervical incision clean dry and intact lumbar incision dressing saturated will change the dressing.  Patient with feeding tube in place if cleared medically, from my perspective patient may be transferred to rehabilitation.

## 2015-09-11 NOTE — Progress Notes (Signed)
PULMONARY / CRITICAL CARE MEDICINE   Name: [name] MRN: [id number] DOB: 11-18-1930    ADMISSION DATE:  09/06/2015 CONSULTATION DATE:  09/06/15  REFERRING MD :  Jordan Likes  CHIEF COMPLAINT:  UE and LE numbness  INITIAL PRESENTATION:  81 y.o. M taken to OR 9/19 with Dr. Jordan Likes of neurosurgery for anterior discectomy / fusion and lumbar laminectomy.  Post op, remained on the vent.  PCCM called for vent management.    STUDIES:  CT head (9/19)- Suggestive of rt MCA infarct. MRU (9/21)- Large MCA and small PCA infarct.  SIGNIFICANT EVENTS: 9/19 - to OR for C2-C3 and C4-5 anterior anterior cervical discectomy with interbody fusion, anterior instrumentation at C2-3 and C4-5, L3-4 decompressive laminectomy with bilateral L3 and l4 foraminotomies.  Returned to ICU on vent. 9/21- Extubated.\ 9/24 PCCM sign off. Tx to rehab.  HISTORY OF PRESENT ILLNESS:  Pt is encephalopathic; therefore, this HPI is obtained from chart review. [name] is a 81 y.o. M with PMH as outlined below.  He has had progressive bilateral upper extremity and lower extremity numbness for which he was evaluated by Dr. Jordan Likes of neurosurgery.  Workup demonstrated evidence of critical stenosis at C2-3 and C4-5 as well as lumbar stenosis at L3-4 and L4-5.  On 9/19, he was taken to the OR for C2-C3 and C4-5 anterior anterior cervical discectomy with interbody fusion, anterior instrumentation at C2-3 and C4-5, L3-4 decompressive laminectomy with bilateral L3 and l4 foraminotomies.  Following the procedure, he remained on the ventilator and PCCM was consulted for vent management.  Of note, pt was in prone positioning for part of the procedure and upon OR staff turning him supine, they noted some facial and possible tongue edema.  There was a concern for possible angioedema.  In addition, neurosurgery had mentioned that they were concerned for new CVA given that pt was not moving left leg.    SUBJECTIVE: Awake and  interactive  VITAL SIGNS: Temp:  [97.8 F (36.6 C)-99.9 F (37.7 C)] 97.8 F (36.6 C) (09/24 0800) Pulse Rate:  [84-118] 92 (09/24 0700) Resp:  [19-36] 21 (09/24 0700) BP: (134-173)/(62-148) 146/69 mmHg (09/24 0700) SpO2:  [92 %-98 %] 94 % (09/24 0700) Weight:  [201 lb 4.5 oz (91.3 kg)] 201 lb 4.5 oz (91.3 kg) (09/24 0500) HEMODYNAMICS:   VENTILATOR SETTINGS:   INTAKE / OUTPUT: Intake/Output      09/23 0701 - 09/24 0700 09/24 0701 - 09/25 0700   I.V. (mL/kg) 1803 (19.7) 3 (0)   NG/GT 540    IV Piggyback 250    Total Intake(mL/kg) 2593 (28.4) 3 (0)   Urine (mL/kg/hr) 1950 (0.9)    Total Output 1950     Net +643 +3        Urine Occurrence 1 x      PHYSICAL EXAMINATION: General: Adult male, in no distress Neuro: Awake, Responds to commands. Cannot move left side. HEENT: PERRLA, EOMI. Cardiovascular: RRR, no M/R/G.  Lungs: Respirations even and unlabored.  CTA bilaterally, No W/R/R. Abdomen: BS x 4, soft, NT/ND.  Musculoskeletal: No gross deformities, no edema.  Skin: Intact, warm, no rashes., multiple areas of ecchymosis.  LABS:  CBC  Recent Labs Lab 09/09/15 0242 09/10/15 0242 09/11/15 0220  WBC 6.3 7.7 9.1  HGB 11.1* 12.0* 11.9*  HCT 32.8* 34.5* 34.7*  PLT 83* 102* 91*   Coag's No results for input(s): APTT, INR in the last 168 hours. BMET  Recent Labs Lab 09/09/15 0242 09/10/15 0242 09/11/15  0220  NA 138 139 136  K 3.8 3.3* 3.3*  CL 109 105 102  CO2 BUN CREATININE 0.82 0.69 0.73  GLUCOSE 129* 123* 162*   Electrolytes  Recent Labs Lab 09/07/15 0233  09/09/15 0242 09/10/15 0242 09/11/15 0220  CALCIUM 8.0*  < > 8.1* 8.1* 7.8*  MG 1.3*  --   --   --  1.6*  PHOS 2.8  --   --   --  1.8*  < > = values in this interval not displayed. Sepsis Markers  Recent Labs Lab 09/06/15 1625  LATICACIDVEN 2.5*   ABG  Recent Labs Lab 09/06/15 1600  PHART 7.402  PCO2ART 40.1  PO2ART 103*   Liver Enzymes  Recent  Labs Lab 09/06/15 1635  AST 22  ALT 12*  ALKPHOS 73  BILITOT 1.3*  ALBUMIN 2.8*   Cardiac Enzymes  Recent Labs Lab 09/06/15 1635  TROPONINI <0.03   Glucose  Recent Labs Lab 09/10/15 1404 09/10/15 1751 09/10/15 1919 09/10/15 2345 09/11/15 0329 09/11/15 0824  GLUCAP 90 90 94 160* 154* 172*    Imaging Dg Abd 1 View  09/10/2015   CLINICAL DATA:  Nasoenteric feeding tube placement by RT  EXAM: ABDOMEN - 1 VIEW  COMPARISON:  06/15/2006  FINDINGS: Feeding tube extends to the ligament of Treitz, confirmed with contrast injection.  IMPRESSION: Feeding tube placement to ligament of Treitz.   Electronically Signed   By: Corlis Leak M.D.   On: 09/10/2015 13:26   Dg Vangie Bicker G Tube Plc W/fl-no Rad  09/10/2015   CLINICAL DATA:    NASO G TUBE PLACEMENT WITH FLUORO  Fluoroscopy was utilized by the requesting physician.  No radiographic  interpretation.      ASSESSMENT / PLAN:  NEUROLOGIC A:   Acute metabolic encephalopathy. Critical stenosis at C2-3 and C4-5 as well as lumbar stenosis at L3-4 and L4-5 - s/p C2-C3 and C4-5 anterior anterior cervical discectomy with interbody fusion, anterior instrumentation at C2-3 and C4-5, L3-4 decompressive laminectomy with bilateral L3 and l4 foraminotomies (09/06/15 by Dr. Jordan Likes). Concern for new infarct per neurosurgery. Hx DDD, vertigo, headaches. P:   Post op care per neurosurgery. On vancomycin for post surgery prophylaxis  PULMONARY OETT 9/19 > 9/21 A: VDRF following surgery 9/19. ? Angioedema after surgery P:   Wean down Fio2 as tolerated. Incentive spirometry.  CARDIOVASCULAR A:  Hypotension post op. Hx HTN, HLD, PVD. P:  Stable. BP goals as per neuro.   RENAL A:   No acute issues - no labs since 9/15. P:   Correct electrolytes as indicated.   GASTROINTESTINAL A:   GERD. Nutrition. P:  SUP: Pantoprazole. NPO. Speech eval. Will need re assessment  today  HEMATOLOGIC A:   VTE Prophylaxis. P:   SCD's.   INFECTIOUS A:   Surgical prophylaxis. P:   Monitor clinically.  ENDOCRINE A:   DM. P:   SSI. Hold outpatient metformin.  Stable for transfer out of ICU, going to in patient rehab, PCCM will sign off.  Family updated: None.  Interdisciplinary Family Meeting v Palliative Care Meeting:  Due by: 9/25.   Brett Canales Minor ACNP [facility] P[facility] Pager [phone] till 3 pm If no answer page [phone] 09/11/2015, 11:11 AM

## 2015-09-11 NOTE — Progress Notes (Signed)
Patient pulled out NG tube, attempted reinsertion on floor, unsuccessful, MD notified.  Orders received and will continue to monitor.

## 2015-09-12 DIAGNOSIS — I480 Paroxysmal atrial fibrillation: Secondary | ICD-10-CM

## 2015-09-12 DIAGNOSIS — I119 Hypertensive heart disease without heart failure: Secondary | ICD-10-CM

## 2015-09-12 DIAGNOSIS — J690 Pneumonitis due to inhalation of food and vomit: Secondary | ICD-10-CM

## 2015-09-12 LAB — GLUCOSE, CAPILLARY
GLUCOSE-CAPILLARY: 128 mg/dL — AB (ref 65–99)
GLUCOSE-CAPILLARY: 116 mg/dL — AB (ref 65–99)
Glucose-Capillary: 104 mg/dL — ABNORMAL HIGH (ref 65–99)
Glucose-Capillary: 113 mg/dL — ABNORMAL HIGH (ref 65–99)
Glucose-Capillary: 117 mg/dL — ABNORMAL HIGH (ref 65–99)
Glucose-Capillary: 123 mg/dL — ABNORMAL HIGH (ref 65–99)

## 2015-09-12 MED ORDER — LEVOFLOXACIN IN D5W 500 MG/100ML IV SOLN
500.0000 mg | INTRAVENOUS | Status: DC
  Administered 2015-09-12 – 2015-09-14 (×3): 500 mg via INTRAVENOUS
  Filled 2015-09-12 (×3): qty 100

## 2015-09-12 NOTE — Progress Notes (Signed)
Patient ID: [name], male   DOB: 03-28-1930, 81 y.o.   MRN: [id number] Patient doing well this morning no complaints awake and alert left hemiplegia stable  Incision clean dry and intact breath at strength out of 5  Neurologically stable  Appreciate cardiology's and neurology's recommendations with regard to his atrial fibrillation and correlation. Should the patient need to be anticoagulated it should be safe to do so now that he is 6 days out from his anterior cervical discectomy and fusion.  Fever last night workup so far negative cultures negative chest x-ray, question of pneumonia we'll start Levaquin

## 2015-09-12 NOTE — Plan of Care (Addendum)
Cardiology on board for recent proxysmal AFib RVR captured on EKG, which raised question of whether his embolic stroke is cardioembolic or related to b/l carotid stenosis in the setting of cervical surgery with neck manipulation. It is unlikely to have definite answer at this time.   Due to large right MCA infarct, he carries higher risk for hemorrhagic transformation with anticoagulation. We will recommend to start anticoagulation about 10-14 days post stroke. If decided on coumadin, ASA and plavix can be discontinued once INR 2-3. If decided on NOACs, then ASA and plavix can be discontinued after initiation of NOACs. If cardiology feels ASA is necessary for cardiac prophylaxis, ASA 81 can be added on top of anticoagulation but pt has to understand that he will have slightly higher risk for bleeding.   Pt also have b/l ICA stenosis up to 60% by MRA neck. He also needs follow up with vascular surgery as outpt for carotid doppler monitoring and consider CEA if indicated.   He will also follow up with Dr. Pearlean Brownie in clinic in about 2 months.   Please call with further questions.   Marvel Plan, MD PhD Stroke Neurology 09/12/2015 9:39 AM

## 2015-09-12 NOTE — Progress Notes (Signed)
PULMONARY / CRITICAL CARE MEDICINE   Name: [name] MRN: [id number] DOB: 08-25-1930    ADMISSION DATE:  09/06/2015 CONSULTATION DATE:  09/06/15  REFERRING MD :  Jordan Likes  CHIEF COMPLAINT:  UE and LE numbness  INITIAL PRESENTATION:  81 y.o. M taken to OR 9/19 with Dr. Jordan Likes of neurosurgery for anterior discectomy / fusion and lumbar laminectomy.  Post op, remained on the vent.  PCCM called for vent management.    STUDIES:  CT head (9/19)- Suggestive of rt MCA infarct. MRU (9/21)- Large MCA and small PCA infarct.  SIGNIFICANT EVENTS: 9/19 - to OR for C2-C3 and C4-5 anterior anterior cervical discectomy with interbody fusion, anterior instrumentation at C2-3 and C4-5, L3-4 decompressive laminectomy with bilateral L3 and l4 foraminotomies.  Returned to ICU on vent. 9/21- Extubated.\ 9/24 PCCM sign off. Tx to rehab. 9/25 PCCM back on for fever 9/25 afib with rvr cards consult  HISTORY OF PRESENT ILLNESS:  Pt is encephalopathic; therefore, this HPI is obtained from chart review. [name] is a 81 y.o. M with PMH as outlined below.  He has had progressive bilateral upper extremity and lower extremity numbness for which he was evaluated by Dr. Jordan Likes of neurosurgery.  Workup demonstrated evidence of critical stenosis at C2-3 and C4-5 as well as lumbar stenosis at L3-4 and L4-5.  On 9/19, he was taken to the OR for C2-C3 and C4-5 anterior anterior cervical discectomy with interbody fusion, anterior instrumentation at C2-3 and C4-5, L3-4 decompressive laminectomy with bilateral L3 and l4 foraminotomies.  Following the procedure, he remained on the ventilator and PCCM was consulted for vent management.  Of note, pt was in prone positioning for part of the procedure and upon OR staff turning him supine, they noted some facial and possible tongue edema.  There was a concern for possible angioedema.  In addition, neurosurgery had mentioned that they were concerned for new CVA given that pt  was not moving left leg.    SUBJECTIVE: Awake and interactive  VITAL SIGNS: Temp:  [98 F (36.7 C)-101 F (38.3 C)] 98.9 F (37.2 C) (09/25 0753) Pulse Rate:  [79-139] 79 (09/25 0800) Resp:  [12-34] 26 (09/25 0800) BP: (126-188)/(50-156) 178/73 mmHg (09/25 0800) SpO2:  [90 %-100 %] 96 % (09/25 0800) Weight:  [191 lb 12.8 oz (87 kg)] 191 lb 12.8 oz (87 kg) (09/25 0400) HEMODYNAMICS:   VENTILATOR SETTINGS:   INTAKE / OUTPUT: Intake/Output      09/24 0701 - 09/25 0700 09/25 0701 - 09/26 0700   I.V. (mL/kg) 1803 (20.7) 75 (0.9)   NG/GT 660    IV Piggyback     Total Intake(mL/kg) 2463 (28.3) 75 (0.9)   Urine (mL/kg/hr) 2040 (1)    Total Output 2040     Net +423 +75          PHYSICAL EXAMINATION: General: Adult male, in no distress, currently afebrile Neuro: Awake, Responds to commands. Cannot move left side. HEENT: PERRLA, EOMI. Cardiovascular: HSr sr rate 87 Lungs: Respirations even and unlabored.  CTA bilaterally, No W/R/R. Abdomen: BS x 4, soft, NT/ND.  Musculoskeletal: No gross deformities, no edema.  Skin: Intact, warm, no rashes., multiple areas of ecchymosis.  LABS:  CBC  Recent Labs Lab 09/10/15 0242 09/11/15 0220 09/11/15 2315  WBC 7.7 9.1 10.0  HGB 12.0* 11.9* 11.9*  HCT 34.5* 34.7* 34.3*  PLT 102* 91* 95*   Coag's No results for input(s): APTT, INR in the last 168 hours. BMET  Recent  Labs Lab 09/10/15 0242 09/11/15 0220 09/11/15 1959  NA 139 136 139  K 3.3* 3.3* 3.5  CL 105 102 106  CO2 BUN CREATININE 0.69 0.73 0.76  GLUCOSE 123* 162* 141*   Electrolytes  Recent Labs Lab 09/07/15 0233  09/10/15 0242 09/11/15 0220 09/11/15 1959  CALCIUM 8.0*  < > 8.1* 7.8* 8.2*  MG 1.3*  --   --  1.6*  --   PHOS 2.8  --   --  1.8*  --   < > = values in this interval not displayed. Sepsis Markers  Recent Labs Lab 09/06/15 1625  LATICACIDVEN 2.5*   ABG  Recent Labs Lab 09/06/15 1600  PHART 7.402  PCO2ART 40.1   PO2ART 103*   Liver Enzymes  Recent Labs Lab 09/06/15 1635 09/11/15 1959  AST 22 24  ALT 12* 18  ALKPHOS 73 68  BILITOT 1.3* 1.7*  ALBUMIN 2.8* 2.3*   Cardiac Enzymes  Recent Labs Lab 09/06/15 1635 09/11/15 1959  TROPONINI <0.03 <0.03   Glucose  Recent Labs Lab 09/11/15 1240 09/11/15 1614 09/11/15 1920 09/11/15 2315 09/12/15 0324 09/12/15 0731  GLUCAP 193* 151* 130* 117* 128* 123*    Imaging Dg Abd 1 View  09/10/2015   CLINICAL DATA:  Nasoenteric feeding tube placement by RT  EXAM: ABDOMEN - 1 VIEW  COMPARISON:  06/15/2006  FINDINGS: Feeding tube extends to the ligament of Treitz, confirmed with contrast injection.  IMPRESSION: Feeding tube placement to ligament of Treitz.   Electronically Signed   By: Corlis Leak M.D.   On: 09/10/2015 13:26   Dg Chest Port 1 View  09/12/2015   CLINICAL DATA:  Fever today.  EXAM: PORTABLE CHEST 1 VIEW  COMPARISON:  09/09/2015.  FINDINGS: Normal sized heart. Decreased patchy and linear density at the right lung base. Stable patchy density at the left lung base. Thoracic spine degenerative changes.  IMPRESSION: 1. Persistent patchy density at the left lung base, suspicious for pneumonia. 2. Decreased atelectasis at the right lung base with residual patchy pneumonia or atelectasis   Electronically Signed   By: Beckie Salts M.D.   On: 09/12/2015 03:04   Dg Vangie Bicker G Tube Plc W/fl-no Rad  09/10/2015   CLINICAL DATA:    NASO G TUBE PLACEMENT WITH FLUORO  Fluoroscopy was utilized by the requesting physician.  No radiographic  interpretation.      ASSESSMENT / PLAN:  NEUROLOGIC A:   Acute metabolic encephalopathy. Critical stenosis at C2-3 and C4-5 as well as lumbar stenosis at L3-4 and L4-5 - s/p C2-C3 and C4-5 anterior anterior cervical discectomy with interbody fusion, anterior instrumentation at C2-3 and C4-5, L3-4 decompressive laminectomy with bilateral L3 and l4 foraminotomies (09/06/15 by Dr. Jordan Likes). Concern for new infarct per  neurosurgery. Hx DDD, vertigo, headaches. P:   Post op care per neurosurgery. On vancomycin for post surgery prophylaxis  PULMONARY OETT 9/19 > 9/21 A: VDRF following surgery 9/19. ? Angioedema after surgery ?pna on c x r P:   Wean down Fio2 as tolerated. Incentive spirometry.  CARDIOVASCULAR A:  Hypotension post op. Hx HTN, HLD, PVD. 9/24 Afib/RVR 9/24 P:  Afib per cards   RENAL A:   No acute issues - P:   Correct electrolytes as indicated.   GASTROINTESTINAL A:   GERD. Nutrition. Dysphagia per slp P:  SUP: Pantoprazole. NPO. Pulled out panda 9/24 Needs replace under fluro in am 9/26  HEMATOLOGIC A:   VTE Prophylaxis.  P:  SCD's.   INFECTIOUS A:   Fever 101 on 9/24 x 1 , now 98.9(9/25) WBC normal CxR ? pna Atx from stroke most likely causing fever spike P:   Monitor clinically. Culture, 9/25 levaquin per NS(consider stopping all abx or tx as HCAP with V/Z per MD)>> 9/24 bc x2>> 9/25 uc>>  ENDOCRINE CBG (last 3)   Recent Labs  09/11/15 2315 09/12/15 0324 09/12/15 0731  GLUCAP 117* 128* 123*     A:   DM. P:   SSI. Hold outpatient metformin.  9/24 developed fever 101(wbc 10, pan cultured no abx) afib RVR(cards seeing), pulled out feeding tube. PCCM asked to stay on board 9/25.  Family updated: None.  Interdisciplinary Family Meeting v Palliative Care Meeting:  Due by: 9/25.   Brett Canales Minor ACNP [facility] P[facility] Pager [phone] till 3 pm If no answer page [phone] 09/12/2015, 9:57 AM

## 2015-09-12 NOTE — Progress Notes (Signed)
Subjective:  Patient has no complaints of shortness of breath or chest pain.  No recurrence of atrial fibrillation overnight  Objective:  Vital Signs in the last 24 hours: BP 178/73 mmHg  Pulse 79  Temp(Src) 98.9 F (37.2 C) (Oral)  Resp 26  Ht  (1.854 m)  Wt 87 kg (191 lb 12.8 oz)  BMI 25.31 kg/m2  SpO2 96%  Physical Exam: Elderly male who is able to answer questions Lungs:  Rhonchi noted  Cardiac:  Regular rhythm, normal S1 and S2, no S3 Extremities:  Left hemiparesis noted   Intake/Output from previous day: 09/24 0701 - 09/25 0700 In: 2463 [I.V.:1803; NG/GT:660] Out: 2040 [Urine:2040] Weight Filed Weights   09/08/15 0400 09/11/15 0500 09/12/15 0400  Weight: 90.7 kg (199 lb 15.3 oz) 91.3 kg (201 lb 4.5 oz) 87 kg (191 lb 12.8 oz)    Lab Results: Basic Metabolic Panel:  Recent Labs  04/54/09 0220 09/11/15 1959  NA 136 139  K 3.3* 3.5  CL 102 106  CO2 26 27  GLUCOSE 162* 141*  BUN 17 16  CREATININE 0.73 0.76    CBC:  Recent Labs  09/11/15 0220 09/11/15 2315  WBC 9.1 10.0  NEUTROABS  --  8.3*  HGB 11.9* 11.9*  HCT 34.7* 34.3*  MCV 87.0 87.7  PLT 91* 95*   Telemetry: Sinus rhythm with PVCs and occasional sinus tachycardia since last night.  No atrial fibrillation noted.  Assessment/Plan:  1.  Paroxysmal atrial fibrillation occurring in the setting of fever as well as beta blocker withdrawal 2.  Hypertension 3.  Diabetes 4.  Peripheral vascular disease 5.  Recent right MCA stroke either watershed or embolic  Recommendations:  Continue intravenous metoprolol until he has an established route by which she can be changed over to oral beta blockers either by tube or swallowing.  Check with neurology in terms of long-term anticoagulation in light of the appearance of atrial fibrillation.  The timing of this depends upon his recent surgery as well as the stroke.      Darden Palmer  MD [facility] Cardiology  09/12/2015, 8:48 AM

## 2015-09-13 ENCOUNTER — Inpatient Hospital Stay (HOSPITAL_COMMUNITY): Payer: Medicare Other

## 2015-09-13 DIAGNOSIS — J189 Pneumonia, unspecified organism: Secondary | ICD-10-CM

## 2015-09-13 LAB — BASIC METABOLIC PANEL
ANION GAP: 8 (ref 5–15)
BUN: 14 mg/dL (ref 6–20)
CHLORIDE: 106 mmol/L (ref 101–111)
CO2: 28 mmol/L (ref 22–32)
Calcium: 8.3 mg/dL — ABNORMAL LOW (ref 8.9–10.3)
Creatinine, Ser: 0.69 mg/dL (ref 0.61–1.24)
GFR calc non Af Amer: >60 mL/min (ref >60)
Glucose, Bld: 133 mg/dL — ABNORMAL HIGH (ref 65–99)
Potassium: 3.4 mmol/L — ABNORMAL LOW (ref 3.5–5.1)
Sodium: 142 mmol/L (ref 135–145)

## 2015-09-13 LAB — GLUCOSE, CAPILLARY
GLUCOSE-CAPILLARY: 120 mg/dL — AB (ref 65–99)
GLUCOSE-CAPILLARY: 123 mg/dL — AB (ref 65–99)
GLUCOSE-CAPILLARY: 145 mg/dL — AB (ref 65–99)
GLUCOSE-CAPILLARY: 192 mg/dL — AB (ref 65–99)
GLUCOSE-CAPILLARY: 94 mg/dL (ref 65–99)

## 2015-09-13 LAB — CBC
HEMATOCRIT: 32.7 % — AB (ref 39.0–52.0)
HEMOGLOBIN: 11 g/dL — AB (ref 13.0–17.0)
MCH: 29.7 pg (ref 26.0–34.0)
MCHC: 33.6 g/dL (ref 30.0–36.0)
MCV: 88.4 fL (ref 78.0–100.0)
Platelets: 106 10*3/uL — ABNORMAL LOW (ref 150–400)
RBC: 3.7 MIL/uL — AB (ref 4.22–5.81)
RDW: 15 % (ref 11.5–15.5)
WBC: 8.1 10*3/uL (ref 4.0–10.5)

## 2015-09-13 LAB — URINE CULTURE

## 2015-09-13 MED ORDER — POTASSIUM CHLORIDE 20 MEQ/15ML (10%) PO SOLN
20.0000 meq | ORAL | Status: DC
  Administered 2015-09-13: 20 meq
  Filled 2015-09-13: qty 15

## 2015-09-13 MED ORDER — POTASSIUM CHLORIDE 20 MEQ/15ML (10%) PO SOLN
40.0000 meq | Freq: Three times a day (TID) | ORAL | Status: AC
  Administered 2015-09-13 (×2): 40 meq
  Filled 2015-09-13 (×2): qty 30

## 2015-09-13 MED ORDER — METOPROLOL TARTRATE 25 MG/10 ML ORAL SUSPENSION
25.0000 mg | Freq: Two times a day (BID) | ORAL | Status: DC
  Administered 2015-09-13 – 2015-09-21 (×16): 25 mg via ORAL
  Filled 2015-09-13 (×21): qty 10

## 2015-09-13 NOTE — Progress Notes (Signed)
Subjective:  Patient is currently sleeping and did not awaken.  Stable overnight without recurrence of atrial fibrillation.  Neurology notes read, started on medicine for possible aspiration   Objective:  Vital Signs in the last 24 hours: BP 157/68 mmHg  Pulse 90  Temp(Src) 98.4 F (36.9 C) (Axillary)  Resp 19  Ht  (1.854 m)  Wt 85.2 kg (187 lb 13.3 oz)  BMI 24.79 kg/m2  SpO2 98%  Physical Exam: Elderly male currently sleeping  Lungs:  Clear  Cardiac:  Regular rhythm, normal S1 and S2, no S3 Extremities:  Left hemiparesis noted   Intake/Output from previous day: 09/25 0701 - 09/26 0700 In: 1825 [I.V.:1725; IV Piggyback:100] Out: 2315 [Urine:2315] Weight Filed Weights   09/11/15 0500 09/12/15 0400 09/13/15 0332  Weight: 91.3 kg (201 lb 4.5 oz) 87 kg (191 lb 12.8 oz) 85.2 kg (187 lb 13.3 oz)    Lab Results: Basic Metabolic Panel:  Recent Labs  16/10/96 1959 09/13/15 0230  NA 139 142  K 3.5 3.4*  CL 106 106  CO2 27 28  GLUCOSE 141* 133*  BUN 16 14  CREATININE 0.76 0.69    CBC:  Recent Labs  09/11/15 2315 09/13/15 0230  WBC 10.0 8.1  NEUTROABS 8.3*  --   HGB 11.9* 11.0*  HCT 34.3* 32.7*  MCV 87.7 88.4  PLT 95* 106*   Telemetry: Sinus rhythm .  No atrial fibrillation noted.  Assessment/Plan:  1.  Paroxysmal atrial fibrillation occurring in the setting of fever as well as beta blocker withdrawal 2.  Hypertension 3.  Diabetes 4.  Peripheral vascular disease 5.  Recent right MCA stroke either watershed or embolic  Recommendations:  1.  Convert to metoprolol 25 mg per tube once tube is placed and stable.  If unable to take per tube then we'll need to metoprolol 5 mg IV every 6 hours 2.  In another week need to begin oral anticoagulation because of the history of atrial fibrillation and stroke.  Would favor using Eliquus if okay with surgery.  Dose per pharmacy.  Check with neurology in terms of long-term anticoagulation in light of the  appearance of atrial fibrillation.  The timing of this depends upon his recent surgery as well as the stroke.      Darden Palmer  MD [facility] Cardiology  09/13/2015, 9:20 AM

## 2015-09-13 NOTE — Progress Notes (Addendum)
PULMONARY / CRITICAL CARE MEDICINE   Name: [name] MRN: [id number] DOB: [birth year]    ADMISSION DATE:  09/06/2015 CONSULTATION DATE:  09/06/15  REFERRING MD :  [name]  CHIEF COMPLAINT:  UE and LE numbness  INITIAL PRESENTATION:  81 y.o. M taken to OR 9/19 with Dr. Jordan Andrade of neurosurgery for anterior discectomy / fusion and lumbar laminectomy.  Post op, remained on the vent.  PCCM called for vent management.    STUDIES:  CT head (9/19)- Suggestive of rt MCA infarct. MRU (9/21)- Large MCA and small PCA infarct.  SIGNIFICANT EVENTS: 9/19 - to OR for C2-C3 and C4-5 anterior anterior cervical discectomy with interbody fusion, anterior instrumentation at C2-3 and C4-5, L3-4 decompressive laminectomy with bilateral L3 and l4 foraminotomies.  Returned to ICU on vent. 9/21- Extubated.\ 9/24 PCCM sign off. Tx to rehab. 9/25 PCCM back on for fever 9/25 afib with rvr cards consult  HISTORY OF PRESENT ILLNESS:  Pt is encephalopathic; therefore, this HPI is obtained from chart review. [name] is a 81 y.o. M with PMH as outlined below.  He has had progressive bilateral upper extremity and lower extremity numbness for which he was evaluated by Dr. Jordan Andrade of neurosurgery.  Workup demonstrated evidence of critical stenosis at C2-3 and C4-5 as well as lumbar stenosis at L3-4 and L4-5.  On 9/19, he was taken to the OR for C2-C3 and C4-5 anterior anterior cervical discectomy with interbody fusion, anterior instrumentation at C2-3 and C4-5, L3-4 decompressive laminectomy with bilateral L3 and l4 foraminotomies.  Following the procedure, he remained on the ventilator and PCCM was consulted for vent management.  Of note, pt was in prone positioning for part of the procedure and upon OR staff turning him supine, they noted some facial and possible tongue edema.  There was a concern for possible angioedema.  In addition, neurosurgery had mentioned that they were concerned for new CVA given that pt  was not moving left leg.  SUBJECTIVE: Awake and interactive  VITAL SIGNS: Temp:  [97.9 F (36.6 C)-99.7 F (37.6 C)] 98.4 F (36.9 C) (09/26 0732) Pulse Rate:  [57-105] 90 (09/26 0800) Resp:  [18-32] 19 (09/26 0800) BP: (141-186)/(46-113) 157/68 mmHg (09/26 0800) SpO2:  [93 %-100 %] 98 % (09/26 0800) Weight:  [85.2 kg (187 lb 13.3 oz)] 85.2 kg (187 lb 13.3 oz) (09/26 0332) HEMODYNAMICS:   VENTILATOR SETTINGS:   INTAKE / OUTPUT: Intake/Output      09/25 0701 - 09/26 0700 09/26 0701 - 09/27 0700   I.V. (mL/kg) 1725 (20.2) 75 (0.9)   NG/GT     IV Piggyback 100    Total Intake(mL/kg) 1825 (21.4) 75 (0.9)   Urine (mL/kg/hr) 2315 (1.1)    Total Output 2315     Net -490 +75         PHYSICAL EXAMINATION: General: Adult male, in no distress, currently afebrile Neuro: Awake, Responds to commands. Cannot move left side. HEENT: PERRLA, EOMI. Cardiovascular: HSr sr rate 87 Lungs: Respirations even and unlabored.  CTA bilaterally, No W/R/R. Abdomen: BS x 4, soft, NT/ND.  Musculoskeletal: No gross deformities, no edema.  Skin: Intact, warm, no rashes., multiple areas of ecchymosis.  LABS:  CBC  Recent Labs Lab 09/11/15 0220 09/11/15 2315 09/13/15 0230  WBC 9.1 10.0 8.1  HGB 11.9* 11.9* 11.0*  HCT 34.7* 34.3* 32.7*  PLT 91* 95* 106*   Coag's No results for input(s): APTT, INR in the last 168 hours. BMET  Recent Labs Lab 09/11/15  0220 09/11/15 1959 09/13/15 0230  NA 136 139 142  K 3.3* 3.5 3.4*  CL 102 106 106  CO2 BUN CREATININE 0.73 0.76 0.69  GLUCOSE 162* 141* 133*   Electrolytes  Recent Labs Lab 09/07/15 0233  09/11/15 0220 09/11/15 1959 09/13/15 0230  CALCIUM 8.0*  < > 7.8* 8.2* 8.3*  MG 1.3*  --  1.6*  --   --   PHOS 2.8  --  1.8*  --   --   < > = values in this interval not displayed. Sepsis Markers  Recent Labs Lab 09/06/15 1625  LATICACIDVEN 2.5*   ABG  Recent Labs Lab 09/06/15 1600  PHART 7.402  PCO2ART 40.1   PO2ART 103*   Liver Enzymes  Recent Labs Lab 09/06/15 1635 09/11/15 1959  AST 22 24  ALT 12* 18  ALKPHOS 73 68  BILITOT 1.3* 1.7*  ALBUMIN 2.8* 2.3*   Cardiac Enzymes  Recent Labs Lab 09/06/15 1635 09/11/15 1959  TROPONINI <0.03 <0.03   Glucose  Recent Labs Lab 09/12/15 1129 09/12/15 1528 09/12/15 1947 09/12/15 2351 09/13/15 0309 09/13/15 0728  GLUCAP 104* 116* 117* 113* 120* 123*   Imaging Dg Chest Port 1 View  09/13/2015   CLINICAL DATA:  81 year old male with respiratory failure. Subsequent encounter.  EXAM: PORTABLE CHEST 1 VIEW  COMPARISON:  09/11/2015.  FINDINGS: Poor inspiration with consolidation lung bases greater on left. This may represent atelectasis or infiltrate and without significant change.  Central pulmonary vascular prominence.  Prominent osteophyte mid thoracic vertebra, better evaluated on 08/12/2015 MR.  Mild cardiomegaly.  No gross pneumothorax.  IMPRESSION: Poor inspiration with consolidation lung bases greater on left. This may represent atelectasis or infiltrate and without significant change.  Central pulmonary vascular prominence.  Prominent osteophyte mid thoracic vertebra, better evaluated on 08/12/2015 MR.  Mild cardiomegaly.   Electronically Signed   By: Lacy Duverney M.D.   On: 09/13/2015 07:59   Dg Chest Port 1 View  09/12/2015   CLINICAL DATA:  Fever today.  EXAM: PORTABLE CHEST 1 VIEW  COMPARISON:  09/09/2015.  FINDINGS: Normal sized heart. Decreased patchy and linear density at the right lung base. Stable patchy density at the left lung base. Thoracic spine degenerative changes.  IMPRESSION: 1. Persistent patchy density at the left lung base, suspicious for pneumonia. 2. Decreased atelectasis at the right lung base with residual patchy pneumonia or atelectasis   Electronically Signed   By: Beckie Salts M.D.   On: 09/12/2015 03:04   Dg Abd Portable 1v  09/13/2015   CLINICAL DATA:  Nasogastric tube placement.  EXAM: PORTABLE ABDOMEN - 1  VIEW  COMPARISON:  09/10/2015 and chest x-ray today.  FINDINGS: There has been placement of a enteric tube with tip over the stomach in the left upper quadrant. Bowel gas pattern is nonobstructive. Persistent left base opacification. Moderate degenerative change of the spine.  IMPRESSION: Nonobstructive bowel gas pattern.  Enteric feeding tube with tip over the stomach in the left upper quadrant.   Electronically Signed   By: Elberta Fortis M.D.   On: 09/13/2015 09:58   I reviewed CXR myself infiltrate on LLL infiltrate.   ASSESSMENT / PLAN:  NEUROLOGIC A:   Acute metabolic encephalopathy. Critical stenosis at C2-3 and C4-5 as well as lumbar stenosis at L3-4 and L4-5 - s/p C2-C3 and C4-5 anterior anterior cervical discectomy with interbody fusion, anterior instrumentation at C2-3 and C4-5, L3-4 decompressive laminectomy with bilateral L3  and l4 foraminotomies (09/06/15 by Dr. Jordan Andrade). Concern for new infarct per neurosurgery. Hx DDD, vertigo, headaches.  P:   Post op care per neurosurgery. On vancomycin for post surgery prophylaxis.  PULMONARY OETT 9/19 > 9/21 A: VDRF following surgery 9/19. ? Angioedema after surgery ?pna on c x r P:   Wean down Fio2 as tolerated. Incentive spirometry.  CARDIOVASCULAR A:  Hypotension post op. Hx HTN, HLD, PVD. 9/24 Afib/RVR 9/24 P:  Afib per cards  RENAL A:   No acute issues - P:   Correct electrolytes as indicated.   GASTROINTESTINAL A:   GERD. Nutrition. Dysphagia per slp P:  SUP: Pantoprazole. Panda in place. TF.  HEMATOLOGIC A:   VTE Prophylaxis.  P:  SCD's.  INFECTIOUS A:   Fever 101 on 9/24 x 1 , now 98.9(9/25) WBC normal CxR ? pna Atx from stroke most likely causing fever spike  P:   Monitor clinically. Culture, 9/25 levaquin per NS (consider stopping all abx or tx as HCAP with V/Z per MD)>> 9/28 9/24 bc x2>> 9/25 uc>>  ENDOCRINE CBG (last 3)   Recent Labs  09/12/15 2351 09/13/15 0309 09/13/15 0728   GLUCAP 113* 120* 123*   A:   DM. P:   SSI. Hold outpatient metformin.  Discussed with bedside RN and PCCM-NP.  Family updated: No family bedside.  Interdisciplinary Family Meeting v Palliative Care Meeting:  Due by: 9/25.  Alyson Reedy, M.D. [facility]. Pager: [phone]. After hours pager: [phone].  09/13/2015, 10:19 AM

## 2015-09-13 NOTE — Progress Notes (Signed)
SLP Cancellation Note  Patient Details Name: [name] MRN: [id number] DOB: Dec 03, 1930   Cancelled treatment:       Reason Eval/Treat Not Completed: Patient at procedure or test/unavailable. Pt with PT/OT. Will return tomorrow.    DeBlois, Riley Nearing 09/13/2015, 2:21 PM

## 2015-09-13 NOTE — Progress Notes (Signed)
Occupational Therapy Treatment Patient Details Name: [name] MRN: [id number] DOB: 1930/08/23 Today's Date: 09/13/2015    History of present illness pt is an 81 y/o male admitted with servere stenosis in cervical and lumbar areas with assoc bil UE and LE's, s/p C23 and C45 fusions and decompressions AND one level lumbar lami and foraminectomy.  After sx noted pt with significant L hemiparesis with some inattension and R gaze preference.  MRI shows R large MCA and smaller PCA infarcts; ?L infarct   OT comments  Pt seen as cotreat with PT today. Pt required increased assistance for tranfers today due to increase in poor postural orientation and apparent pusher syndrome. Pt did demonstrate increased ability to scan into L visual field and maintain attention to target in L field today. Responds well after facilitation to reorient self to midline. Max to total A with ADL. Total A +2 with stand pivot transfer today. Recommend nsg use maxisky to get back to bed. Pt good CIR candidate if family will be able to provide 24/7 physical assistance after D/C. Will follow acutely.   Follow Up Recommendations  CIR;Supervision/Assistance - 24 hour    Equipment Recommendations  3 in 1 bedside comode;Tub/shower bench;Hospital bed    Recommendations for Other Services Rehab consult    Precautions / Restrictions Precautions Precautions: Fall;Cervical;Back Required Braces or Orthoses: Cervical Brace Cervical Brace: Hard collar;At all times       Mobility Bed Mobility Overal bed mobility: Needs Assistance;+2 for physical assistance   Rolling: Max assist Sidelying to sit: +2 for physical assistance;Max assist       General bed mobility comments: L neglect affecting mobility. Unaware of leaving L side of body on bed  Transfers Overall transfer level: Needs assistance Equipment used: 2 person hand held assist Transfers: Sit to/from [employer] Sit to Stand: Max assist;+2  physical assistance Stand pivot transfers: Total assist;+2 physical assistance       General transfer comment: Increased assistance for transfers today    Balance Overall balance assessment: Needs assistance   Sitting balance-Leahy Scale: Poor Sitting balance - Comments: significant posterior and L bias. Able to sustain midline position with mod A at times with cues to maintain R shoulder on therapist's shoulder to R side.   Postural control: Posterior lean;Left lateral lean Standing balance support: During functional activity;Single extremity supported Standing balance-Leahy Scale: Zero Standing balance comment: poor postural control making standing balance more difficult today.                   ADL Overall ADL's : Needs assistance/impaired Eating/Feeding: NPO   Grooming: Maximal assistance Grooming Details (indicate cue type and reason): poor spatial orientation during grooming task. Unable to locate L hand independently. Trying to use dominanat L hand to ask face, however, pt is not able to move hand at this time and demonstrates poor awareness concerning his inability to move his L hand Upper Body Bathing: Maximal assistance   Lower Body Bathing: Total assistance   Upper Body Dressing : Total assistance   Lower Body Dressing: Total assistance               Functional mobility during ADLs: +2 for physical assistance;Total assistance General ADL Comments: signnificant pushing to L. poor awareness of midline. L neglect. Appears worse with pushing L today.      Vision                 Additional Comments: L field cut. R gaze  preference but able to sustain gaze for @ 5 seconds in L visual field.   Perception     Praxis      Cognition   Behavior During Therapy: Restless;Flat affect;Impulsive Overall Cognitive Status: Impaired/Different from baseline Area of Impairment: Attention;Following commands;Safety/judgement;Awareness;Orientation;Problem  solving Orientation Level: Disoriented to;Time Current Attention Level: Sustained Memory: Decreased recall of precautions;Decreased short-term memory  Following Commands: Follows one step commands with increased time Safety/Judgement: Decreased awareness of safety;Decreased awareness of deficits Awareness: Intellectual Problem Solving: Slow processing;Difficulty sequencing;Requires verbal cues;Requires tactile cues General Comments: L neglect    Extremity/Trunk Assessment     minimal increased movement LUE proximally. Poor sensation. Unable to identify LUE if not looking at L arm          Exercises     Shoulder Instructions       General Comments      Pertinent Vitals/ Pain       Pain Assessment: Faces Faces Pain Scale: Hurts a little bit Pain Location: back Pain Descriptors / Indicators: Discomfort;Grimacing Pain Intervention(s): Limited activity within patient's tolerance;Monitored during session  Home Living                                          Prior Functioning/Environment              Frequency Min 3X/week     Progress Toward Goals  OT Goals(current goals can now be found in the care plan section)  Progress towards OT goals: Progressing toward goals  Acute Rehab OT Goals Patient Stated Goal: to get better OT Goal Formulation: With patient/family Time For Goal Achievement: 09/24/15 Potential to Achieve Goals: Good ADL Goals Pt Will Perform Grooming: sitting;with min assist Pt Will Perform Upper Body Bathing: with min assist;sitting Pt Will Transfer to Toilet: with min assist;with +2 assist;bedside commode;stand pivot transfer Additional ADL Goal #1: Maintain midline postural contro EOB x 10 min with minguard Additional ADL Goal #2: Locate 3/3 item L of midline with min vc  Plan Discharge plan remains appropriate    Co-evaluation    PT/OT/SLP Co-Evaluation/Treatment: Yes Reason for Co-Treatment: Complexity of the patient's  impairments (multi-system involvement);Necessary to address cognition/behavior during functional activity;For patient/therapist safety   OT goals addressed during session: ADL's and self-care;Strengthening/ROM      End of Session Equipment Utilized During Treatment: Cervical collar;Oxygen   Activity Tolerance Patient tolerated treatment well   Patient Left in chair;with call bell/phone within reach;with family/visitor present   Nurse Communication Mobility status;Precautions;Weight bearing status        Time: 1610-9604 OT Time Calculation (min): 46 min  Charges: OT General Charges $OT Visit: 1 Procedure OT Treatments $Self Care/Home Management : 8-22 mins  Gilverto Dileonardo,HILLARY 09/13/2015, 3:00 PM   [facility], OTR/L  [phone] 09/13/2015

## 2015-09-13 NOTE — Progress Notes (Signed)
Rehab admissions - Evaluated for possible admission.  I spoke with patient's grandson this am.  [name] would like patient to come to inpatient rehab.  It is then expected that patient would transition to SNF for more rehab.  I will initiate request with insurance carrier for acute inpatient rehab admission.  Call me for questions.  #[phone]

## 2015-09-13 NOTE — Progress Notes (Signed)
[facility] ADULT ICU REPLACEMENT PROTOCOL FOR AM LAB REPLACEMENT ONLY  The patient does apply for the [facility] Adult ICU Electrolyte Replacment Protocol based on the criteria listed below:   1. Is GFR >/= 40 ml/min? Yes.    Patient's GFR today is >60 2. Is urine output >/= 0.5 ml/kg/hr for the last 6 hours? Yes.   Patient's UOP is 1.0 ml/kg/hr 3. Is BUN < 60 mg/dL? Yes.    Patient's BUN today is 14 4. Abnormal electrolyte(s): K 3.4 5. Ordered repletion with: per protocol 6. If a panic level lab has been reported, has the CCM MD in charge been notified? No..   Physician:    Markus Daft A 09/13/2015 6:27 AM

## 2015-09-13 NOTE — Progress Notes (Signed)
Physical Therapy Treatment Patient Details Name: PEARLY APACHITO MRN: [id number] DOB: Apr 15, 1930 Today's Date: 09/13/2015    History of Present Illness pt is an 81 y/o male admitted with servere stenosis in cervical and lumbar areas with assoc bil UE and LE's, s/p C23 and C45 fusions and decompressions AND one level lumbar lami and foraminectomy.  After sx noted pt with significant L hemiparesis with some inattension and R gaze preference.  MRI shows R large MCA and smaller PCA infarcts; ?L infarct    PT Comments    Progressing slowly  Follow Up Recommendations  CIR;Other (comment) (family needs to be able to manage 24/7 assist)     Equipment Recommendations  Other (comment);None recommended by PT    Recommendations for Other Services Rehab consult     Precautions / Restrictions Precautions Precautions: Fall;Cervical;Back Required Braces or Orthoses: Cervical Brace Cervical Brace: Hard collar;At all times    Mobility  Bed Mobility Overal bed mobility: Needs Assistance Bed Mobility: Sit to Sidelying         Sit to sidelying: Max assist;+2 for safety/equipment    Transfers     Transfers: Stand Pivot Transfers   Stand pivot transfers: Total assist;+2 physical assistance          Ambulation/Gait                 Stairs            Wheelchair Mobility    Modified Rankin (Stroke Patients Only)       Balance     Sitting balance-Leahy Scale: Poor       Standing balance-Leahy Scale: Zero                      Cognition Arousal/Alertness: Awake/alert Behavior During Therapy: Flat affect Overall Cognitive Status: Impaired/Different from baseline Area of Impairment: Attention;Following commands;Safety/judgement;Awareness;Orientation;Problem solving   Current Attention Level: Sustained Memory: Decreased recall of precautions;Decreased short-term memory Following Commands: Follows one step commands with increased  time Safety/Judgement: Decreased awareness of safety;Decreased awareness of deficits Awareness: Intellectual Problem Solving: Slow processing;Difficulty sequencing;Requires verbal cues;Requires tactile cues General Comments: L neglect    Exercises      General Comments        Pertinent Vitals/Pain Pain Assessment: Faces Faces Pain Scale: No hurt    Home Living                      Prior Function            PT Goals (current goals can now be found in the care plan section) Acute Rehab PT Goals PT Goal Formulation: With patient Time For Goal Achievement: 09/23/15 Potential to Achieve Goals: Good Progress towards PT goals: Progressing toward goals    Frequency  Min 3X/week    PT Plan Current plan remains appropriate;Frequency needs to be updated    Co-evaluation             End of Session Equipment Utilized During Treatment: Oxygen Activity Tolerance: Patient tolerated treatment well;Patient limited by fatigue Patient left: in bed;with call bell/phone within reach     Time: 1835-1845 PT Time Calculation (min) (ACUTE ONLY): 10 min  Charges:  $Therapeutic Activity: 8-22 mins                    G Codes:      Mottinger, Eliseo Gum 09/13/2015, 7:14 PM

## 2015-09-13 NOTE — Progress Notes (Signed)
Physical Therapy Treatment Patient Details Name: [name] MRN: [id number] DOB: 1930-01-30 Today's Date: 09/13/2015    History of Present Illness pt is an 81 y/o male admitted with servere stenosis in cervical and lumbar areas with assoc bil UE and LE's, s/p C23 and C45 fusions and decompressions AND one level lumbar lami and foraminectomy.  After sx noted pt with significant L hemiparesis with some inattension and R gaze preference.  MRI shows R large MCA and smaller PCA infarcts; ?L infarct    PT Comments    Worked at EOB to inhibit pushing and facilitate sitting balance in midline.  Facilitated pt bring attention to his left side with some L or R UE across midline activities.  Transferred to the chair with significant assist.  Follow Up Recommendations  Supervision/Assistance - 24 hour;Other (comment) (if family can commet to 24 hour assist )     Equipment Recommendations  Other (comment) (TBA further)    Recommendations for Other Services       Precautions / Restrictions Precautions Precautions: Fall;Cervical;Back Required Braces or Orthoses: Cervical Brace Cervical Brace: Hard collar;At all times    Mobility  Bed Mobility Overal bed mobility: Needs Assistance;+2 for physical assistance Bed Mobility: Rolling;Sidelying to Sit Rolling: Max assist Sidelying to sit: +2 for physical assistance;Max assist       General bed mobility comments: L neglect affecting mobility. Unaware of leaving L side of body on bed  Transfers Overall transfer level: Needs assistance Equipment used: 2 person hand held assist Transfers: Sit to/from [employer] Sit to Stand: Max assist;+2 physical assistance Stand pivot transfers: Total assist;+2 physical assistance       General transfer comment: Increased assistance for transfers today  Ambulation/Gait                 Stairs            Wheelchair Mobility    Modified Rankin (Stroke Patients  Only) Modified Rankin (Stroke Patients Only) Pre-Morbid Rankin Score: Moderate disability Modified Rankin: Severe disability     Balance Overall balance assessment: Needs assistance Sitting-balance support: Feet supported;Single extremity supported;Bilateral upper extremity supported Sitting balance-Leahy Scale: Poor Sitting balance - Comments: significant posterior and L bias. Able to sustain midline position with mod A at times with cues to maintain R shoulder on therapist's shoulder to R side.   Postural control: Posterior lean;Left lateral lean Standing balance support: During functional activity;Single extremity supported Standing balance-Leahy Scale: Zero Standing balance comment: poor truncal control.  Inhibiting tendency for pt to push Left                    Cognition Arousal/Alertness: Awake/alert Behavior During Therapy: Restless;Flat affect;Impulsive Overall Cognitive Status: Impaired/Different from baseline Area of Impairment: Attention;Following commands;Safety/judgement;Awareness;Orientation;Problem solving Orientation Level: Disoriented to;Time Current Attention Level: Sustained Memory: Decreased recall of precautions;Decreased short-term memory Following Commands: Follows one step commands with increased time Safety/Judgement: Decreased awareness of safety;Decreased awareness of deficits Awareness: Intellectual Problem Solving: Slow processing;Difficulty sequencing;Requires verbal cues;Requires tactile cues General Comments: L neglect    Exercises General Exercises - Lower Extremity Hip Flexion/Marching: AROM;Strengthening;Both;10 reps;Supine;Other (comment) (with graded resistance and extension)    General Comments General comments (skin integrity, edema, etc.): Pushing behaviors increased today.  Gaze preference, but pt able to cross midline left with encouragement and visual/aural cues      Pertinent Vitals/Pain Pain Assessment: Faces Faces Pain  Scale: Hurts a little bit Pain Location: back Pain Descriptors / Indicators: Grimacing Pain  Intervention(s): Monitored during session    Home Living                      Prior Function            PT Goals (current goals can now be found in the care plan section) Acute Rehab PT Goals Patient Stated Goal: to get better PT Goal Formulation: With patient Time For Goal Achievement: 09/23/15 Potential to Achieve Goals: Good Progress towards PT goals: Progressing toward goals    Frequency  Min 4X/week    PT Plan Current plan remains appropriate    Co-evaluation PT/OT/SLP Co-Evaluation/Treatment: Yes Reason for Co-Treatment: Complexity of the patient's impairments (multi-system involvement) PT goals addressed during session: Mobility/safety with mobility OT goals addressed during session: ADL's and self-care;Strengthening/ROM     End of Session   Activity Tolerance: Patient tolerated treatment well;Patient limited by fatigue Patient left: in chair;with call bell/phone within reach;Other (comment)     Time: 0981-1914 PT Time Calculation (min) (ACUTE ONLY): 46 min  Charges:  $Therapeutic Activity: 23-37 mins                    G Codes:      Mottinger, Eliseo Gum 09/13/2015, 3:13 PM 09/13/2015  [place], PT [phone]  (pager)

## 2015-09-13 NOTE — Progress Notes (Signed)
Overall stable. Patient denies any pain. Still with complete left hemiparesis.  No further fevers. Heart rate controlled. No evidence of A. fib. Participating in therapy. Dressings clean and dry. Strength in right upper and lower extremities stable.  Patient's condition is stabilized. Okay to transfer to floor. Okay for inpatient rehabilitation when bed available.

## 2015-09-13 NOTE — Progress Notes (Signed)
Rehab admissions - I have received a denial for acute inpatient rehab admission from insurance carrier stating that "needs can be met at a lower level of care such as a SNF".  I will let case manager and social worker know of denial.  Call me for questions.  #[phone]

## 2015-09-13 NOTE — Progress Notes (Signed)
Patient arrived to 84C06 AAOx3 with some confusion. Vitals taken, tele placed, tube feed increased and restarted. Call bell by his side. Will continue to monitor closely. Alannis Hsia, [place] Scrape, RN

## 2015-09-14 DIAGNOSIS — N390 Urinary tract infection, site not specified: Secondary | ICD-10-CM

## 2015-09-14 DIAGNOSIS — M4806 Spinal stenosis, lumbar region: Secondary | ICD-10-CM

## 2015-09-14 DIAGNOSIS — E876 Hypokalemia: Secondary | ICD-10-CM

## 2015-09-14 LAB — GLUCOSE, CAPILLARY
Glucose-Capillary: 115 mg/dL — ABNORMAL HIGH (ref 65–99)
Glucose-Capillary: 138 mg/dL — ABNORMAL HIGH (ref 65–99)
Glucose-Capillary: 162 mg/dL — ABNORMAL HIGH (ref 65–99)
Glucose-Capillary: 173 mg/dL — ABNORMAL HIGH (ref 65–99)
Glucose-Capillary: 122 mg/dL — ABNORMAL HIGH (ref 65–99)
Glucose-Capillary: 117 mg/dL — ABNORMAL HIGH (ref 65–99)

## 2015-09-14 LAB — URINE CULTURE
Culture: >=100000
Special Requests: NORMAL

## 2015-09-14 LAB — CBC
HCT: 31.8 % — ABNORMAL LOW (ref 39.0–52.0)
Hemoglobin: 10.6 g/dL — ABNORMAL LOW (ref 13.0–17.0)
MCH: 29.9 pg (ref 26.0–34.0)
MCHC: 33.3 g/dL (ref 30.0–36.0)
MCV: 89.6 fL (ref 78.0–100.0)
PLATELETS: 130 10*3/uL — AB (ref 150–400)
RBC: 3.55 MIL/uL — ABNORMAL LOW (ref 4.22–5.81)
RDW: 15 % (ref 11.5–15.5)
WBC: 7.3 10*3/uL (ref 4.0–10.5)

## 2015-09-14 LAB — BASIC METABOLIC PANEL
ANION GAP: 6 (ref 5–15)
BUN: 19 mg/dL (ref 6–20)
CALCIUM: 7.8 mg/dL — AB (ref 8.9–10.3)
CO2: 27 mmol/L (ref 22–32)
CREATININE: 0.74 mg/dL (ref 0.61–1.24)
Chloride: 104 mmol/L (ref 101–111)
GFR calc Af Amer: >60 mL/min (ref >60)
GLUCOSE: 154 mg/dL — AB (ref 65–99)
Potassium: 3.3 mmol/L — ABNORMAL LOW (ref 3.5–5.1)
Sodium: 137 mmol/L (ref 135–145)

## 2015-09-14 LAB — PHOSPHORUS: PHOSPHORUS: 1.8 mg/dL — AB (ref 2.5–4.6)

## 2015-09-14 LAB — MAGNESIUM: MAGNESIUM: 1.6 mg/dL — AB (ref 1.7–2.4)

## 2015-09-14 MED ORDER — SULFAMETHOXAZOLE-TRIMETHOPRIM 200-40 MG/5ML PO SUSP
20.0000 mL | Freq: Two times a day (BID) | ORAL | Status: AC
  Administered 2015-09-15 – 2015-09-21 (×13): 20 mL
  Filled 2015-09-14 (×18): qty 20

## 2015-09-14 MED ORDER — PANCRELIPASE (LIP-PROT-AMYL) 12000-38000 UNITS PO CPEP
2.0000 | ORAL_CAPSULE | Freq: Once | ORAL | Status: AC
  Administered 2015-09-14: 24000 [IU] via ORAL
  Filled 2015-09-14: qty 2

## 2015-09-14 MED ORDER — POTASSIUM CHLORIDE CRYS ER 20 MEQ PO TBCR
40.0000 meq | EXTENDED_RELEASE_TABLET | Freq: Once | ORAL | Status: AC
  Administered 2015-09-14: 40 meq via ORAL
  Filled 2015-09-14: qty 2

## 2015-09-14 MED ORDER — LEVOFLOXACIN IN D5W 750 MG/150ML IV SOLN
750.0000 mg | INTRAVENOUS | Status: AC
  Administered 2015-09-15 – 2015-09-18 (×4): 750 mg via INTRAVENOUS
  Filled 2015-09-14 (×4): qty 150

## 2015-09-14 MED ORDER — SODIUM BICARBONATE 650 MG PO TABS
650.0000 mg | ORAL_TABLET | Freq: Once | ORAL | Status: AC
  Administered 2015-09-14: 650 mg via ORAL
  Filled 2015-09-14: qty 1

## 2015-09-14 NOTE — Clinical Social Work Note (Signed)
Clinical Social Work Assessment  Patient Details  Name: [name] MRN: [id number] Date of Birth: 12-Jul-1930  Date of referral:  09/14/15               Reason for consult:  Discharge Planning                Permission sought to share information with:  Case Manager, Family Supports, Magazine features editor Permission granted to share information::  Yes, Verbal Permission Granted  Name::      [name] )  Agency::   (SNF's in [place])  Relationship::   (Grandson )  Contact Information:   [phone])  Housing/Transportation Living arrangements for the past 2 months:  Single Family Home Source of Information:   Technical sales engineer) Patient Interpreter Needed:  None Criminal Activity/Legal Involvement Pertinent to Current Situation/Hospitalization:  No - Comment as needed Significant Relationships:  Adult Children, Other Family Members Lives with:  Self Do you feel safe going back to the place where you live?  No Need for family participation in patient care:  Yes (Comment)  Care giving concerns:  Insurance denied CIR/ IP REHAB. Patient requiring continued therapy and 24 hour supervision/assistance.    Social Worker assessment / plan: Visual merchandiser spoke with patient's grandson, [name] in reference to post-acute placement for SNF. CSW introduced CSW role and SNF process. Pt's grandson reported that family was hopeful for CIR however agreeable to consider other rehab options. Pt's grandson stated family is familiar with [facility] [place]. CSW has completed FL-2 and faxed clinicals to [facility] area. CSW has also confirmed bed at [facility] [place] at discharge. Pt's grandson would like to discuss SNF placement with pt's son and granddaughter before making final decision. No further concerns reported at this time. CSW will continue to follow pt and pt's family for continued support and to facilitate pt's discharge need once medically stable.   Employment  status:  Retired Database administrator PT Recommendations:  Skilled Nursing Facility Information / Referral to community resources:  Skilled Nursing Facility  Patient/Family's Response to care:  Pt drowsy. Pt's grandson agreeable to SNF placement in [place] and prefers [facility] [place]. Pt's grandson appreciated social work intervention and strongly involved in pt's care.   Patient/Family's Understanding of and Emotional Response to Diagnosis, Current Treatment, and Prognosis:  Pt's grandson involved in pt's care and aware of continued medical workup and treatment.   Emotional Assessment Appearance:  Appears stated age Attitude/Demeanor/Rapport:  Unable to Assess Affect (typically observed):  Unable to Assess Orientation:  Oriented to Self, Oriented to Place Alcohol / Substance use:  Not Applicable Psych involvement (Current and /or in the community):  No (Comment)  Discharge Needs  Concerns to be addressed:  Care Coordination Readmission within the last 30 days:  No Current discharge risk:  Dependent with Mobility, Cognitively Impaired Barriers to Discharge:  Continued Medical Work up   [employer], MSW, LCSWA [phone] 09/14/2015 11:45 AM

## 2015-09-14 NOTE — Progress Notes (Signed)
Initial Nutrition Assessment  INTERVENTION:  Continue to provide Jevity 1.2 @ 60 ml/hr via NGT with 30 ml of Pro-stat BID  Tube feeding regimen provides 1928 kcal, 110 grams of protein, and 1166 ml of H2O.    NUTRITION DIAGNOSIS:   Inadequate oral intake related to inability to eat as evidenced by NPO status.  Ongoing  GOAL:   Patient will meet greater than or equal to 90% of their needs  Being Met  MONITOR:   TF tolerance, Labs, I & O's, Weight trends  REASON FOR ASSESSMENT:   Consult Enteral/tube feeding initiation and management  ASSESSMENT:   81 year old male with PMH DM, HTN, GERD who had a posterior approach laminectomy then post op patient was unresponsive with facial swelling and a rash so decision was made to leave patient intubated.  Pt has NGT in place with Jevity 1.2 tube feeding infusing at 60 ml/hr. Pt denies any abdominal pain, abdominal distension or nausea. He reports usual body weight of 188 lbs. Pt's weight is stable.   Labs: glucose ranging 94 to 192 mg/dL, low hemoglobin  Diet Order:  Diet NPO time specified  Skin:  Wound (see comment) (closed neck incision)  Last BM:  9/26  Height:   Ht Readings from Last 1 Encounters:  09/13/15 6' 1"  (1.854 m)    Weight:   Wt Readings from Last 1 Encounters:  09/14/15 190 lb 7.6 oz (86.4 kg)    Ideal Body Weight:  83.6 kg  BMI:  Body mass index is 25.14 kg/(m^2).  Estimated Nutritional Needs:   Kcal:  1900-2100  Protein:  100-115 grams  Fluid:  > 1.9 L/day  EDUCATION NEEDS:   No education needs identified at this time  [place], LDN Inpatient Clinical Dietitian Pager: [phone] After Hours Pager: [phone]

## 2015-09-14 NOTE — Progress Notes (Signed)
Physical Therapy Treatment Patient Details Name: [name] MRN: [id number] DOB: 1930/12/06 Today's Date: 09/14/2015    History of Present Illness pt is an 81 y/o male admitted with servere stenosis in cervical and lumbar areas with assoc bil UE and LE's, s/p C23 and C45 fusions and decompressions AND one level lumbar lami and foraminectomy.  After sx noted pt with significant L hemiparesis with some inattension and R gaze preference.  MRI shows R large MCA and smaller PCA infarcts; ?L infarct    PT Comments    Pt continues to require 2 person A for mobility and safety.  Noted Insurance denied CIR, so feel pt will now need SNF at D/C.  Will continue to follow.    Follow Up Recommendations  SNF     Equipment Recommendations  None recommended by PT    Recommendations for Other Services       Precautions / Restrictions Precautions Precautions: Fall;Cervical;Back Required Braces or Orthoses: Cervical Brace Cervical Brace: Hard collar;At all times Restrictions Weight Bearing Restrictions: No    Mobility  Bed Mobility Overal bed mobility: Needs Assistance;+2 for physical assistance Bed Mobility: Rolling;Sidelying to Sit;Sit to Sidelying Rolling: Max assist;+2 for physical assistance Sidelying to sit: Max assist;+2 for physical assistance     Sit to sidelying: Max assist;+2 for physical assistance General bed mobility comments: pt needs hand over hand cueing for log roll and attending to L side.    Transfers                    Ambulation/Gait                 Stairs            Wheelchair Mobility    Modified Rankin (Stroke Patients Only) Modified Rankin (Stroke Patients Only) Pre-Morbid Rankin Score: Moderate disability Modified Rankin: Severe disability     Balance Overall balance assessment: Needs assistance Sitting-balance support: Feet supported;Single extremity supported Sitting balance-Leahy Scale: Poor Sitting balance - Comments:  pt pushes to L and posteriorly.  pt able to prop on R elbow with ModA, but continues to push posteriorly.   Postural control: Posterior lean;Left lateral lean                          Cognition Arousal/Alertness: Awake/alert Behavior During Therapy: Flat affect Overall Cognitive Status: Impaired/Different from baseline Area of Impairment: Attention;Following commands;Safety/judgement;Awareness;Problem solving   Current Attention Level: Sustained   Following Commands: Follows one step commands with increased time;Follows multi-step commands inconsistently Safety/Judgement: Decreased awareness of safety;Decreased awareness of deficits Awareness: Intellectual Problem Solving: Slow processing;Difficulty sequencing;Requires verbal cues;Requires tactile cues      Exercises      General Comments        Pertinent Vitals/Pain Pain Assessment: No/denies pain    Home Living                      Prior Function            PT Goals (current goals can now be found in the care plan section) Acute Rehab PT Goals Patient Stated Goal: to get better PT Goal Formulation: With patient Time For Goal Achievement: 09/23/15 Potential to Achieve Goals: Good Progress towards PT goals: Progressing toward goals    Frequency  Min 3X/week    PT Plan Discharge plan needs to be updated    Co-evaluation  End of Session Equipment Utilized During Treatment: Cervical collar Activity Tolerance: Patient limited by fatigue Patient left: in bed;with call bell/phone within reach;with bed alarm set     Time: 1610-9604 PT Time Calculation (min) (ACUTE ONLY): 19 min  Charges:  $Therapeutic Activity: 8-22 mins                    G CodesSunny Schlein, [place] [phone] 09/14/2015, 12:21 PM

## 2015-09-14 NOTE — Clinical Social Work Placement (Signed)
   CLINICAL SOCIAL WORK PLACEMENT  NOTE  Date:  09/14/2015  Patient Details  Name: [name] MRN: [id number] Date of Birth: [birth year]  Clinical Social Work is seeking post-discharge placement for this patient at the Skilled  Nursing Facility level of care (*CSW will initial, date and re-position this form in  chart as items are completed):  Yes   Patient/family provided with [facility] Clinical Social Work Department's list of facilities offering this level of care within the geographic area requested by the patient (or if unable, by the patient's family).  Yes   Patient/family informed of their freedom to choose among providers that offer the needed level of care, that participate in Medicare, Medicaid or managed care program needed by the patient, have an available bed and are willing to accept the patient.  Yes   Patient/family informed of [facility]'s ownership interest in [facility], as well as of the fact that they are under no obligation to receive care at these facilities.  PASRR submitted to EDS on 09/14/15     PASRR number received on 09/14/15     Existing PASRR number confirmed on       FL2 transmitted to all facilities in geographic area requested by pt/family on 09/14/15     FL2 transmitted to all facilities within larger geographic area on       Patient informed that his/her managed care company has contracts with or will negotiate with certain facilities, including the following:        Yes   Patient/family informed of bed offers received.  Patient chooses bed at  [facility] )     Physician recommends and patient chooses bed at      Patient to be transferred to  [facility] ) on  .  Patient to be transferred to facility by       Patient family notified on   of transfer.  Name of family member notified:        PHYSICIAN Please sign FL2 on chart for MD signature.   Additional Comment:     _______________________________________________ Derenda Fennel, MSW, LCSWA [phone] 09/14/2015 11:46 AM

## 2015-09-14 NOTE — Progress Notes (Signed)
Overall stable.  Exam unchanged,  Wounds c/d/i.   Cont rehab efforts.  SNF vs inpt rehab

## 2015-09-14 NOTE — Care Management Important Message (Signed)
Important Message  Patient Details  Name: [name] MRN: [id number] Date of Birth: 06-21-1930   Medicare Important Message Given:  Yes-third notification given    [name] 09/14/2015, 2:29 PM

## 2015-09-14 NOTE — Progress Notes (Signed)
Speech Language Pathology Treatment: Dysphagia  Patient Details Name: [name] MRN: [id number] DOB: 04-Apr-1930 Today's Date: 09/14/2015 Time: 6578-4696 SLP Time Calculation (min) (ACUTE ONLY): 31 min  Assessment / Plan / Recommendation Clinical Impression  Pt demonstrates no improvement in swallow function this session, though majority of efforts were spent in clearing mouth and oropharyngeal of thick, standing secretions. Large dried glob of mucous removed from base of tongue. After this pt was able to orally manipulate ice chips and initiate effortful swallows, though laryngeal elevation still quite impaired and evidence of residual noted. Will f/u tomorrow for more trials.    HPI Other Pertinent Information: [name] is an 81 y.o. male with a past medical history significant for HTN, DM, hypercholesterolemia, PAD, GERD, who underwent C2-3 and C4-5 anterior cervical discectomy with interbody fusion as well as L3-4 decompressive laminectomy with bilateral L3 and L4 foraminotomies on 9/19, and few hours postoperatively was noted to have significant left hemiparesis that prompted CT brain which demonstrated findings concerning for acute ischemia in the right MCA territory. Pt remained on the vent post-operatively 9/19-9/21.   Pertinent Vitals Pain Assessment: Faces Faces Pain Scale: No hurt  SLP Plan  Continue with current plan of care    Recommendations Diet recommendations: NPO Medication Administration: Via alternative means              General recommendations: Rehab consult Oral Care Recommendations: Oral care BID Follow up Recommendations: Inpatient Rehab Plan: Continue with current plan of care    GO    [facility], MA CCC-SLP [phone]  Claudine Mouton 09/14/2015, 3:21 PM

## 2015-09-14 NOTE — Progress Notes (Signed)
Inpatient Rehabilitation  I have left a voicemail for pt's grandson, Hershall Benkert that we have received an insurance denial for Mr. Renae Gloss for an [employer] admission.  I left my number and requested that he call me back if he has any questions.    Weldon Picking PT Inpatient Rehab Admissions Coordinator Cell [phone] Office [phone]

## 2015-09-14 NOTE — Progress Notes (Signed)
Patient's NG tube has been clogged up. After several unsuccessful attempts to get it unclogged, neurosurgery was called. New order received from Dr Conchita Paris to insert a new NG tube.

## 2015-09-14 NOTE — Progress Notes (Signed)
PULMONARY / CRITICAL CARE MEDICINE   Name: [name] MRN: [id number] DOB: Jul 29, 1930    ADMISSION DATE:  09/06/2015 CONSULTATION DATE:  09/06/15  REFERRING MD :  Jordan Likes  CHIEF COMPLAINT:  UE and LE numbness  BRIEF PATIENT DESCRIPTION:  81 y.o. M taken to OR 9/19 with Dr. Jordan Likes of neurosurgery for anterior discectomy / fusion and lumbar laminectomy.  Post op, remained on the vent.  PCCM called for vent management. Extubated successfully on 9/21 and transferred to rehab 9/24. Developed fever 9/25, consolidation on CXR. PCCM asked to see again.    STUDIES:  CT head (9/19)- Suggestive of rt MCA infarct. MRU (9/21)- Large MCA and small PCA infarct.  SIGNIFICANT EVENTS: 9/19 - to OR for C2-C3 and C4-5 anterior anterior cervical discectomy with interbody fusion, anterior instrumentation at C2-3 and C4-5, L3-4 decompressive laminectomy with bilateral L3 and l4 foraminotomies.  Returned to ICU on vent. 9/21- Extubated. OETT 9/19 > 9/21 9/24 PCCM sign off. Tx to rehab. 9/25 PCCM back on for fever 9/25 afib with rvr cards consult   SUBJECTIVE: Awake and interactive. Feels "good" today.   VITAL SIGNS: Temp:  [97.8 F (36.6 C)-98.9 F (37.2 C)] 98.1 F (36.7 C) (09/27 0925) Pulse Rate:  [71-88] 88 (09/27 0925) Resp:  [19-31] 19 (09/27 0925) BP: (143-182)/(58-100) 159/58 mmHg (09/27 0925) SpO2:  [95 %-100 %] 98 % (09/27 0925) Weight:  [86.4 kg (190 lb 7.6 oz)-88.3 kg (194 lb 10.7 oz)] 86.4 kg (190 lb 7.6 oz) (09/27 0417) HEMODYNAMICS:   VENTILATOR SETTINGS:   INTAKE / OUTPUT: Intake/Output      09/26 0701 - 09/27 0700 09/27 0701 - 09/28 0700   I.V. (mL/kg) 900 (10.4)    NG/GT 205    IV Piggyback 100    Total Intake(mL/kg) 1205 (13.9)    Urine (mL/kg/hr) 1875 (0.9)    Stool 1 (0)    Total Output 1876     Net -671           PHYSICAL EXAMINATION: General: Adult male, in no distress, currently afebrile Neuro: Awake, Responds to commands. Cannot move left side. HEENT:  PERRLA, EOMI. Cardiovascular: HSr sr rate 87 Lungs: Respirations even and unlabored.  CTA bilaterally, No W/R/R. Abdomen: BS x 4, soft, NT/ND.  Musculoskeletal: No gross deformities, no edema.  Skin: Intact, warm, no rashes., multiple areas of ecchymosis.  LABS:  CBC  Recent Labs Lab 09/11/15 2315 09/13/15 0230 09/14/15 0532  WBC 10.0 8.1 7.3  HGB 11.9* 11.0* 10.6*  HCT 34.3* 32.7* 31.8*  PLT 95* 106* 130*   Coag's No results for input(s): APTT, INR in the last 168 hours. BMET  Recent Labs Lab 09/11/15 1959 09/13/15 0230 09/14/15 0532  NA 139 142 137  K 3.5 3.4* 3.3*  CL 106 106 104  CO2 BUN CREATININE 0.76 0.69 0.74  GLUCOSE 141* 133* 154*   Electrolytes  Recent Labs Lab 09/11/15 0220 09/11/15 1959 09/13/15 0230 09/14/15 0532  CALCIUM 7.8* 8.2* 8.3* 7.8*  MG 1.6*  --   --  1.6*  PHOS 1.8*  --   --  1.8*   Sepsis Markers No results for input(s): LATICACIDVEN, PROCALCITON, O2SATVEN in the last 168 hours. ABG No results for input(s): PHART, PCO2ART, PO2ART in the last 168 hours. Liver Enzymes  Recent Labs Lab 09/11/15 1959  AST 24  ALT 18  ALKPHOS 68  BILITOT 1.7*  ALBUMIN 2.3*   Cardiac Enzymes  Recent Labs  Lab 09/11/15 1959  TROPONINI <0.03   Glucose  Recent Labs Lab 09/13/15 1149 09/13/15 1602 09/13/15 2003 09/13/15 2352 09/14/15 0357 09/14/15 0746  GLUCAP 145* 94 115* 192* 138* 162*   Imaging Dg Chest Port 1 View  09/13/2015   CLINICAL DATA:  81 year old male with respiratory failure. Subsequent encounter.  EXAM: PORTABLE CHEST 1 VIEW  COMPARISON:  09/11/2015.  FINDINGS: Poor inspiration with consolidation lung bases greater on left. This may represent atelectasis or infiltrate and without significant change.  Central pulmonary vascular prominence.  Prominent osteophyte mid thoracic vertebra, better evaluated on 08/12/2015 MR.  Mild cardiomegaly.  No gross pneumothorax.  IMPRESSION: Poor inspiration with  consolidation lung bases greater on left. This may represent atelectasis or infiltrate and without significant change.  Central pulmonary vascular prominence.  Prominent osteophyte mid thoracic vertebra, better evaluated on 08/12/2015 MR.  Mild cardiomegaly.   Electronically Signed   By: Lacy Duverney M.D.   On: 09/13/2015 07:59   Dg Abd Portable 1v  09/13/2015   CLINICAL DATA:  Nasogastric tube placement.  EXAM: PORTABLE ABDOMEN - 1 VIEW  COMPARISON:  09/10/2015 and chest x-ray today.  FINDINGS: There has been placement of a enteric tube with tip over the stomach in the left upper quadrant. Bowel gas pattern is nonobstructive. Persistent left base opacification. Moderate degenerative change of the spine.  IMPRESSION: Nonobstructive bowel gas pattern.  Enteric feeding tube with tip over the stomach in the left upper quadrant.   Electronically Signed   By: Elberta Fortis M.D.   On: 09/13/2015 09:58    ASSESSMENT / PLAN:   Acute metabolic encephalopathy. Critical stenosis at C2-3 and C4-5 as well as lumbar stenosis at L3-4 and L4-5 - s/p C2-C3 and C4-5 anterior anterior cervical discectomy with interbody fusion, anterior instrumentation at C2-3 and C4-5, L3-4 decompressive laminectomy with bilateral L3 and l4 foraminotomies (09/06/15 by Dr. Jordan Likes). Concern for new infarct per neurosurgery. Hx DDD, vertigo, headaches. - Post op care per neurosurgery. - On vancomycin for post surgery prophylaxis.  UTI - Ecoli  LLL ATX vs PNA (HCAP vs VAP) -Monitor clinically.  -Incentive spirometry -9/24 bc x2>> -9/25 uc> E.coli > bactrim, zosyn, cef, macrobid  Sensitive. Resistant to fluoroquinolones  -9/25 Levaquin for PNA > increase for mild-mod VAP dosing. Stop 10/1. -9/27 Bactrim Per tube for UTI >>> will need 2 weeks  Hx HTN, HLD, PVD. AF RVR > resolved -Cardiology following -Continue metoprolol  GERD. Nutrition. -SUP: Pantoprazole. -Panda in place. -TF. - swallow eval  pending   DM. -SSI. -Hold outpatient metformin.   Joneen Roach, AGACNP-BC [facility] Pulmonology/Critical Care Pager [phone] or [phone]  09/14/2015 11:11 AM

## 2015-09-15 ENCOUNTER — Inpatient Hospital Stay (HOSPITAL_COMMUNITY): Payer: Medicare Other

## 2015-09-15 DIAGNOSIS — I4891 Unspecified atrial fibrillation: Secondary | ICD-10-CM

## 2015-09-15 DIAGNOSIS — J189 Pneumonia, unspecified organism: Secondary | ICD-10-CM | POA: Diagnosis not present

## 2015-09-15 DIAGNOSIS — B9620 Unspecified Escherichia coli [E. coli] as the cause of diseases classified elsewhere: Secondary | ICD-10-CM | POA: Clinically undetermined

## 2015-09-15 DIAGNOSIS — E876 Hypokalemia: Secondary | ICD-10-CM

## 2015-09-15 DIAGNOSIS — N390 Urinary tract infection, site not specified: Secondary | ICD-10-CM

## 2015-09-15 DIAGNOSIS — J9600 Acute respiratory failure, unspecified whether with hypoxia or hypercapnia: Secondary | ICD-10-CM | POA: Insufficient documentation

## 2015-09-15 LAB — BASIC METABOLIC PANEL
Anion gap: 6 (ref 5–15)
BUN: 14 mg/dL (ref 6–20)
CO2: 27 mmol/L (ref 22–32)
CREATININE: 0.74 mg/dL (ref 0.61–1.24)
Calcium: 7.9 mg/dL — ABNORMAL LOW (ref 8.9–10.3)
Chloride: 102 mmol/L (ref 101–111)
GFR calc Af Amer: >60 mL/min (ref >60)
GLUCOSE: 129 mg/dL — AB (ref 65–99)
Potassium: 3.4 mmol/L — ABNORMAL LOW (ref 3.5–5.1)
SODIUM: 135 mmol/L (ref 135–145)

## 2015-09-15 LAB — HEPATIC FUNCTION PANEL
ALT: 23 U/L (ref 17–63)
AST: 33 U/L (ref 15–41)
Albumin: 1.9 g/dL — ABNORMAL LOW (ref 3.5–5.0)
Alkaline Phosphatase: 73 U/L (ref 38–126)
BILIRUBIN DIRECT: 0.5 mg/dL (ref 0.1–0.5)
BILIRUBIN INDIRECT: 1.2 mg/dL — AB (ref 0.3–0.9)
Total Bilirubin: 1.7 mg/dL — ABNORMAL HIGH (ref 0.3–1.2)
Total Protein: 5.4 g/dL — ABNORMAL LOW (ref 6.5–8.1)

## 2015-09-15 LAB — GLUCOSE, CAPILLARY
GLUCOSE-CAPILLARY: 113 mg/dL — AB (ref 65–99)
GLUCOSE-CAPILLARY: 123 mg/dL — AB (ref 65–99)
GLUCOSE-CAPILLARY: 130 mg/dL — AB (ref 65–99)
GLUCOSE-CAPILLARY: 131 mg/dL — AB (ref 65–99)
GLUCOSE-CAPILLARY: 146 mg/dL — AB (ref 65–99)
Glucose-Capillary: 146 mg/dL — ABNORMAL HIGH (ref 65–99)

## 2015-09-15 LAB — MAGNESIUM: MAGNESIUM: 1.6 mg/dL — AB (ref 1.7–2.4)

## 2015-09-15 MED ORDER — LIDOCAINE VISCOUS 2 % MT SOLN
OROMUCOSAL | Status: AC
  Filled 2015-09-15: qty 15

## 2015-09-15 MED ORDER — MAGNESIUM SULFATE IN D5W 10-5 MG/ML-% IV SOLN
1.0000 g | Freq: Once | INTRAVENOUS | Status: AC
  Administered 2015-09-15: 1 g via INTRAVENOUS
  Filled 2015-09-15 (×2): qty 100

## 2015-09-15 MED ORDER — FLUCONAZOLE 100 MG PO TABS: 100.0000 mg | ORAL_TABLET | Freq: Every day | ORAL | Status: DC

## 2015-09-15 MED ORDER — METHYLPREDNISOLONE SODIUM SUCC 125 MG IJ SOLR
60.0000 mg | Freq: Two times a day (BID) | INTRAMUSCULAR | Status: DC
  Administered 2015-09-16: 60 mg via INTRAVENOUS
  Filled 2015-09-15: qty 2

## 2015-09-15 MED ORDER — SODIUM CHLORIDE 0.9 % IV SOLN
INTRAVENOUS | Status: DC
  Administered 2015-09-15 – 2015-09-22 (×11): via INTRAVENOUS
  Filled 2015-09-15 (×17): qty 1000

## 2015-09-15 MED ORDER — METOPROLOL TARTRATE 1 MG/ML IV SOLN
5.0000 mg | Freq: Four times a day (QID) | INTRAVENOUS | Status: DC | PRN
  Administered 2015-09-15: 5 mg via INTRAVENOUS
  Filled 2015-09-15 (×3): qty 5

## 2015-09-15 MED ORDER — IOHEXOL 300 MG/ML  SOLN
50.0000 mL | Freq: Once | INTRAMUSCULAR | Status: DC | PRN
  Administered 2015-09-15: 20 mL
  Filled 2015-09-15: qty 50

## 2015-09-15 MED ORDER — FLUCONAZOLE 100 MG PO TABS
100.0000 mg | ORAL_TABLET | Freq: Every day | ORAL | Status: AC
  Administered 2015-09-16 – 2015-09-22 (×7): 100 mg
  Filled 2015-09-15 (×7): qty 1

## 2015-09-15 MED ORDER — POTASSIUM CHLORIDE 20 MEQ/15ML (10%) PO SOLN
40.0000 meq | Freq: Once | ORAL | Status: DC
  Filled 2015-09-15: qty 30

## 2015-09-15 NOTE — Progress Notes (Signed)
NG tube not inserted due to the inability of patient to appropriately tilt head. Will re-attempt in the morning.

## 2015-09-15 NOTE — Progress Notes (Signed)
PROGRESS NOTE  [name] ZOX:[id number] DOB: 06/19/1930 DOA: 09/06/2015 PCP: Colette Ribas, MD  Assessment/Plan:  [facility] assumed care of general medical management issues on 09/15/2015  [name] is a 81 y.o. male with history of HTN, DM, hypercholesterolemia, PAD, GERD, who presented to [name] on 9/19 for surgery to remedy progressive bilateral upper and lower extremity numbness.  He underwent C2-3 and C4-5 anterior cervical discectomy with interbody fusion as well as L3-4 decompressive laminectomy with bilateral L3 and L4 foraminotomies. Unfortunately post op he had facial swelling with rash (questionable anaphylaxis) and it was decided he should stay on the vent a few days more.  He was also found to have dense left hemiparesis and imaging revealed a right MCA infarct.  He was started on asa and plavix.  On approximately 9/24 he develop fever and atrial fibrillation.  He was found to have both a LLL infiltrate and a UTI.  He was started on antibiotics.  Cardiology consulted and restarted his betablocker therapy.  Anticoagulation was considered but neurology recommended it be deferred to 10 - 14 days (until approximately 10/10) in order to avoid hemorraghic conversion of his ischemic stroke. Speech therapy was consulted and found the patient unable to clear his own secretions.  He is currently receiving nutrition via a panda tube and still having significant difficulty with secretions.  His insurance denied CIR.  Consequently when appropriate he will most likely be discharged to SNF.   Spinal stenosis S/P surgery 09/06/2015.  Wounds clean and dry. Management per Primary Team Neurosurgery   Bilateral Pneumonia Likely due to aspiration.  Initially treated with Vanc / Levaquin.  Now narrowed to levaquin.   Atrial Fibrillation Cardiology consulted.  Felt that the Afib was likely due to fever and BB withdraw.  BB was restarted.   Dr. Donnie Aho recommend a  NOAC (Eliquis?) be started.  Neurology recommends anticoagulation be delayed until 10/5 in order to prevent hemorrhagic conversion.  NOAC start date 10/5   Ecoli UTI Resistant to ampicillin / sulbactam / cipro. Currently on Bactrim per CCM.   Will complete Levaquin on 10/1 and Bactrim on 10/4. Monitor creatinine.   Stroke: Large right MCA, scattered right PCA as well as punctate left MCA and right SCA  Resultant left hemiparesis.  Bilateral ICA origin stenosis of up to 60% in the neck.  2D EchoLeft ventricle: EF 60% to 65%. ASA and plavix for 3 months and then plavix alone. Neurology signed off.  Pt will follow up with Dr. Pearlean Brownie at [facility] in about 2 months   Hypokalemia (3.4) (9/28) Will check magnesium.  Give 1g mag IV as prophylactic supplementation. Change IVF to NS with potassium at 40 meq.   Hypertension Stable. Current antihypertensive medications:  Metoprolol, HCTZ Would consider adding back MICARDIS when bactrim is complete for better control and renal protection if ok with Neurosurgery (was discontinued by Dr. Jordan Likes)   Hyperlipidemia Home meds resumed: Crestor 10 mg daily  LDL 41, goal < 70   Type II Diabetes Hgb A1C is 6.1 Will hold home oral metformin.  CBGs are stable on Q4 SSI   Oral thrush (9/28) Will place on diflucan per tube and request scheduled oral care.    Dysphagia Per Neuro a right MCA stroke would be unlikely to cause an inability to swallow.  Per speech his dysphagia is likely due to swelling from his spinal surgery.  Will check esophagram to evaluate possible obstruction.  Give IV solumedrol  60 mg x 2 doses to shrink potential swelling.   Nutrition Currently receiving Jevity via Panda tube.  Unable to take POs on 9/28.  Speech would like to have time to re-evaluate.  PANDA clogged 9/27 pm.  Will ask IR to replace on 9/28.   Tobacco abuse Smoking prior to admission. Smoking cessation counseling provided   Palliative Medicine Will request  consultation for GOC.     DVT Prophylaxis:  Heparin   Code Status: Full  Family Communication:  Disposition Plan: SNF when appropriate.  Definitive management of nutrition    Consultants: [facility] assumed care from [facility] Cardiology, Dr. Donnie Aho Neurosurgery is the primary attending Will request Palliative consultation as well.   Procedures: Intubation 9/19.  Extubation 9/21 Panda placed.     Antibiotics: Anti-infectives    Start     Dose/Rate Route Frequency Ordered Stop   09/15/15 1000  levofloxacin (LEVAQUIN) IVPB 750 mg     750 mg 100 mL/hr over 90 Minutes Intravenous Every 24 hours 09/14/15 1157     09/14/15 1300  sulfamethoxazole-trimethoprim (BACTRIM,SEPTRA) 200-40 MG/5ML suspension 20 mL     20 mL Per Tube Every 12 hours 09/14/15 1201     09/12/15 1000  levofloxacin (LEVAQUIN) IVPB 500 mg  Status:  Discontinued     500 mg 100 mL/hr over 60 Minutes Intravenous Every 24 hours 09/12/15 0954 09/14/15 1157   09/10/15 1800  vancomycin (VANCOCIN) 1,250 mg in sodium chloride 0.9 % 250 mL IVPB  Status:  Discontinued     1,250 mg 166.7 mL/hr over 90 Minutes Intravenous Every 24 hours 09/09/15 2024 09/11/15 1122   09/06/15 1830  vancomycin (VANCOCIN) IVPB 1000 mg/200 mL premix  Status:  Discontinued     1,000 mg 200 mL/hr over 60 Minutes Intravenous Every 12 hours 09/06/15 1534 09/09/15 2024   09/06/15 0715  bacitracin 50,000 Units in sodium chloride irrigation 0.9 % 500 mL irrigation  Status:  Discontinued       As needed 09/06/15 0900 09/06/15 1300   09/06/15 0644  vancomycin (VANCOCIN) IVPB 1000 mg/200 mL premix     1,000 mg 200 mL/hr over 60 Minutes Intravenous On call to O.R. 09/06/15 6295 09/06/15 0852       HPI/Subjective: Reports he is feeling ok and would really like to eat some cake.  Objective: Filed Vitals:   09/14/15 2148 09/15/15 0100 09/15/15 0500 09/15/15 0547  BP: 157/87 160/76  157/80  Pulse: 108 94  95  Temp: 98.4 F (36.9  C) 98.6 F (37 C)  98.1 F (36.7 C)  TempSrc: Oral Oral  Oral  Resp: Height:      Weight:   89 kg (196 lb 3.4 oz)   SpO2: 99% 98%  95%    Intake/Output Summary (Last 24 hours) at 09/15/15 0834 Last data filed at 09/15/15 0100  Gross per 24 hour  Intake      0 ml  Output   2200 ml  Net  -2200 ml   Filed Weights   09/13/15 2008 09/14/15 0417 09/15/15 0500  Weight: 88.3 kg (194 lb 10.7 oz) 86.4 kg (190 lb 7.6 oz) 89 kg (196 lb 3.4 oz)    Exam: General: Frail elderly pleasant male,  Lying in bed in hard cervical collar, coughing up secretions. HEENT:  PER,  Anicteic Sclera, MMM. Tongue with thick white coating. Neck: hard collar in place. Cardiovascular: irregularly irregular, S1 S2 auscultated, no rubs Respiratory: no increased work of  breathing.  Some congestion, no wheeze Abdomen: Soft, nontender, nondistended, + bowel sounds  Extremities: warm dry without cyanosis clubbing or edema.  Neuro: Awake and alert but sleepy. Speech is clear.  Inability to move left sided extremities.  Sensation is in tact. Skin: Without rashes exudates or nodules.   Psych: Normal affect and demeanor with intact judgement and insight    Data Reviewed: Basic Metabolic Panel:  Recent Labs Lab 09/11/15 0220 09/11/15 1959 09/13/15 0230 09/14/15 0532 09/15/15 0726  NA 136 139 142 137 135  K 3.3* 3.5 3.4* 3.3* 3.4*  CL 102 106 106 104 102  CO2 26 27 28 27 27   GLUCOSE 162* 141* 133* 154* 129*  BUN 17 16 14 19 14   CREATININE 0.73 0.76 0.69 0.74 0.74  CALCIUM 7.8* 8.2* 8.3* 7.8* 7.9*  MG 1.6*  --   --  1.6*  --   PHOS 1.8*  --   --  1.8*  --    Liver Function Tests:  Recent Labs Lab 09/11/15 1959  AST 24  ALT 18  ALKPHOS 68  BILITOT 1.7*  PROT 5.3*  ALBUMIN 2.3*   CBC:  Recent Labs Lab 09/10/15 0242 09/11/15 0220 09/11/15 2315 09/13/15 0230 09/14/15 0532  WBC 7.7 9.1 10.0 8.1 7.3  NEUTROABS  --   --  8.3*  --   --   HGB 12.0* 11.9* 11.9* 11.0* 10.6*  HCT  34.5* 34.7* 34.3* 32.7* 31.8*  MCV 87.6 87.0 87.7 88.4 89.6  PLT 102* 91* 95* 106* 130*   Cardiac Enzymes:  Recent Labs Lab 09/11/15 1959  CKTOTAL 58  CKMB 2.0  TROPONINI <0.03    CBG:  Recent Labs Lab 09/14/15 1623 09/14/15 1939 09/15/15 0016 09/15/15 0405 09/15/15 0725  GLUCAP 122* 117* 130* 131* 123*    Recent Results (from the past 240 hour(s))  Culture, blood (routine x 2)     Status: None (Preliminary result)   Collection Time: 09/11/15 10:59 PM  Result Value Ref Range Status   Specimen Description BLOOD LEFT HAND  Final   Special Requests BOTTLES DRAWN AEROBIC AND ANAEROBIC 5CC  Final   Culture NO GROWTH 3 DAYS  Final   Report Status PENDING  Incomplete  Culture, blood (routine x 2)     Status: None (Preliminary result)   Collection Time: 09/11/15 11:09 PM  Result Value Ref Range Status   Specimen Description BLOOD RIGHT HAND  Final   Special Requests IN PEDIATRIC BOTTLE 1CC  Final   Culture NO GROWTH 3 DAYS  Final   Report Status PENDING  Incomplete  Culture, Urine     Status: None   Collection Time: 09/11/15 11:54 PM  Result Value Ref Range Status   Specimen Description URINE, RANDOM  Final   Special Requests NONE  Final   Culture 50,000 COLONIES/mL YEAST  Final   Report Status 09/13/2015 FINAL  Final  Culture, Urine     Status: None   Collection Time: 09/12/15 12:45 PM  Result Value Ref Range Status   Specimen Description URINE, CLEAN CATCH  Final   Special Requests Normal  Final   Culture >=100,000 COLONIES/mL ESCHERICHIA COLI  Final   Report Status 09/14/2015 FINAL  Final   Organism ID, Bacteria ESCHERICHIA COLI  Final      Susceptibility   Escherichia coli - MIC*    AMPICILLIN >=32 RESISTANT Resistant     CEFAZOLIN <=4 SENSITIVE Sensitive     CEFTRIAXONE <=1 SENSITIVE Sensitive     CIPROFLOXACIN >=  4 RESISTANT Resistant     GENTAMICIN <=1 SENSITIVE Sensitive     IMIPENEM <=0.25 SENSITIVE Sensitive     NITROFURANTOIN <=16 SENSITIVE Sensitive      TRIMETH/SULFA <=20 SENSITIVE Sensitive     AMPICILLIN/SULBACTAM >=32 RESISTANT Resistant     PIP/TAZO <=4 SENSITIVE Sensitive     * >=100,000 COLONIES/mL ESCHERICHIA COLI     Studies: Dg Abd Portable 1v  09/13/2015   CLINICAL DATA:  Nasogastric tube placement.  EXAM: PORTABLE ABDOMEN - 1 VIEW  COMPARISON:  09/10/2015 and chest x-ray today.  FINDINGS: There has been placement of a enteric tube with tip over the stomach in the left upper quadrant. Bowel gas pattern is nonobstructive. Persistent left base opacification. Moderate degenerative change of the spine.  IMPRESSION: Nonobstructive bowel gas pattern.  Enteric feeding tube with tip over the stomach in the left upper quadrant.   Electronically Signed   By: Elberta Fortis M.D.   On: 09/13/2015 09:58    Scheduled Meds: . antiseptic oral rinse  7 mL Mouth Rinse q12n4p  . aspirin EC  81 mg Oral Daily  . chlorhexidine  15 mL Mouth Rinse BID  . clopidogrel  75 mg Oral Daily  . feeding supplement (PRO-STAT SUGAR FREE 64)  30 mL Per Tube BID  . folic acid  1 mg Oral Daily  . heparin subcutaneous  5,000 Units Subcutaneous 3 times per day  . insulin aspart  0-15 Units Subcutaneous 6 times per day  . levofloxacin (LEVAQUIN) IV  750 mg Intravenous Q24H  . metoprolol tartrate  25 mg Oral BID  . pantoprazole (PROTONIX) IV  40 mg Intravenous QHS  . rosuvastatin  10 mg Oral Daily  . sulfamethoxazole-trimethoprim  20 mL Per Tube Q12H   Continuous Infusions: . sodium chloride 1,000 mL (09/13/15 0211)  . feeding supplement (JEVITY 1.2 CAL) 1,000 mL (09/14/15 0417)  . phenylephrine (NEO-SYNEPHRINE) Adult infusion Stopped (09/06/15 2115)    Principal Problem:   Spinal stenosis in cervical region Active Problems:   Acute right MCA stroke   Bilateral pneumonia   Paroxysmal atrial fibrillation   E-coli UTI   Atrial fibrillation   Spinal stenosis, lumbar region, with neurogenic claudication   Cerebral embolism with cerebral infarction    Carotid stenosis   Tobacco use disorder   HLD (hyperlipidemia)   Hypertensive heart disease   Acute respiratory failure   Hypokalemia    Algis Downs, PA-C  [facility] Pager [phone]. If 7PM-7AM, please contact night-coverage at [email or web address], password [facility] 09/15/2015, 8:34 AM  LOS: 9 days

## 2015-09-15 NOTE — Progress Notes (Signed)
Occupational Therapy Treatment Patient Details Name: [name] MRN: [id number] DOB: 11-04-1930 Today's Date: 09/15/2015    History of present illness pt is an 81 y/o male admitted with servere stenosis in cervical and lumbar areas with assoc bil UE and LE's, s/p C23 and C45 fusions and decompressions AND one level lumbar lami and foraminectomy.  After sx noted pt with significant L hemiparesis with some inattension and R gaze preference.  MRI shows R large MCA and smaller PCA infarcts; ?L infarct   OT comments  Pt making progress with gradually increasing awareness of L body/environment. L hemiparesis with minimal activation of L shoulder - beginning to move in synergy pattern. Will return to use maximove to mobilize pt OOB with assistance of CNA to educate staff on safest way to mobilize pt. Pt will need extensive rehab. If family is able to provide 24/7 physical assistance, then CIR is a good option. If family is not able not provide this level of care, pt will need SNF.   Follow Up Recommendations  CIR;Supervision/Assistance - 24 hour    Equipment Recommendations  3 in 1 bedside comode;Tub/shower bench;Hospital bed    Recommendations for Other Services Rehab consult    Precautions / Restrictions Precautions Precautions: Fall;Cervical;Back Precaution Booklet Issued: Yes (comment) Precaution Comments: pusher syndrome Required Braces or Orthoses: Cervical Brace Cervical Brace: Hard collar;At all times Restrictions Weight Bearing Restrictions: No       Mobility Bed Mobility Overal bed mobility: Needs Assistance Bed Mobility: Rolling;Sidelying to Sit Rolling: Max assist Sidelying to sit: Max assist     Sit to sidelying: Max assist General bed mobility comments: Once in sidelying, pt able to assist with pushing up to sit, but immediately pushes posteriorly  Transfers Overall transfer level: Needs assistance               General transfer comment: Did not attempt  today. Need +2. Recommend staff get pt OOB wtih maximove    Balance     Sitting balance-Leahy Scale: Poor                             ADL Overall ADL's : Needs assistance/impaired Eating/Feeding: NPO   Grooming: Moderate assistance;Wash/dry hands;Wash/dry face Grooming Details (indicate cue type and reason): Pt able to locate his LUE today without tactile cues Upper Body Bathing: Moderate assistance;Sitting Upper Body Bathing Details (indicate cue type and reason): uncoordinated movements with RUE                         Functional mobility during ADLs:  (unable to attempt today. Need to use maximove) General ADL Comments: pushing to left 2 bed level; poor awareness of midline, however, pt states "I keep falling over"      Vision                     Perception     Praxis      Cognition   Behavior During Therapy: Flat affect Overall Cognitive Status: Impaired/Different from baseline Area of Impairment: Orientation;Attention;Memory;Following commands;Safety/judgement;Awareness;Problem solving Orientation Level: Disoriented to;Time Current Attention Level: Sustained Memory: Decreased recall of precautions;Decreased short-term memory  Following Commands: Follows one step commands with increased time Safety/Judgement: Decreased awareness of safety;Decreased awareness of deficits (improving ability to recognize deficits) Awareness: Intellectual Problem Solving: Slow processing;Decreased initiation;Difficulty sequencing;Requires verbal cues;Requires tactile cues General Comments: L neglect    Extremity/Trunk Assessment  Increased tone LUE. Able initiate shoulder adduction in synergy pattern. Pt does visually attend to LUE. Brunstrom stage 1 UE; able to locate LUE with vc only today.            Exercises Other Exercises Other Exercises: RUE A/[facility] with guiding for control Other Exercises: LUE PROM - tightness in shoulder flexion @ 90.  Did not push due to cervical precautions   Shoulder Instructions       General Comments  Pt states he has been "eating cake". ( Pt NPO/no cake in room)    Pertinent Vitals/ Pain       Pain Assessment: Faces Faces Pain Scale: No hurt  Home Living                                          Prior Functioning/Environment              Frequency Min 3X/week     Progress Toward Goals  OT Goals(current goals can now be found in the care plan section)  Progress towards OT goals: Progressing toward goals  Acute Rehab OT Goals Patient Stated Goal: to get better OT Goal Formulation: With patient Time For Goal Achievement: 09/24/15 Potential to Achieve Goals: Good ADL Goals Pt Will Perform Grooming: sitting;with min assist Pt Will Perform Upper Body Bathing: with min assist;sitting Pt Will Transfer to Toilet: with min assist;with +2 assist;bedside commode;stand pivot transfer Additional ADL Goal #1: Maintain midline postural contro EOB x 10 min with minguard Additional ADL Goal #2: Locate 3/3 item L of midline with min vc  Plan Discharge plan remains appropriate    Co-evaluation                 End of Session Equipment Utilized During Treatment: Cervical collar   Activity Tolerance Patient tolerated treatment well   Patient Left in bed;with call bell/phone within reach;with SCD's reapplied;with bed alarm set   Nurse Communication Mobility status;Need for lift equipment;Precautions        Time: 1308-6578 OT Time Calculation (min): 24 min  Charges: OT General Charges $OT Visit: 1 Procedure OT Treatments $Self Care/Home Management : 23-37 mins  WARD,HILLARY 09/15/2015, 11:07 AM   Luisa Dago, OTR/L  [phone] 09/15/2015

## 2015-09-15 NOTE — Progress Notes (Signed)
Speech Language Pathology Treatment: Dysphagia  Patient Details Name: [name] MRN: [id number] DOB: [birth year] Today's Date: 09/15/2015 Time: 1545-1600 SLP Time Calculation (min) (ACUTE ONLY): 15 min  Assessment / Plan / Recommendation Clinical Impression  F/u swallow treatment and education session.  Pt's granddaughter present; explained results of last week's MBS, the combined effects of surgery and stroke upon swallow function.  Pt remains significantly dysphagic - bedside trials of nectars reveal immediate coughing; pt having difficulty managing secretions with overt s/s of aspiration.  Oral suctioning removed nectar liquids from pharynx.  If symptoms improve, pt may benefit from repeat MBS - suspect recovery will be slow.  SLP will follow.    HPI Other Pertinent Information: [name] is an 81 y.o. male with a past medical history significant for HTN, DM, hypercholesterolemia, PAD, GERD, who underwent C2-3 and C4-5 anterior cervical discectomy with interbody fusion as well as L3-4 decompressive laminectomy with bilateral L3 and L4 foraminotomies on 9/19, and few hours postoperatively was noted to have significant left hemiparesis that prompted CT brain which demonstrated findings concerning for acute ischemia in the right MCA territory. Pt remained on the vent post-operatively 9/19-9/21.   Pertinent Vitals    SLP Plan  Continue with current plan of care    Recommendations Diet recommendations: NPO Medication Administration: Via alternative means              Oral Care Recommendations: Oral care QID Follow up Recommendations: Inpatient Rehab Plan: Continue with current plan of care    GO     [name] 09/15/2015, 4:31 PM

## 2015-09-15 NOTE — Progress Notes (Signed)
Subjective:  Was transitioned to oral metoprolol but has had some tube issues with tube clogging.  Missed dose last night and was in atrial fibrillation this a.m. Currently in NSR.  Objective:  Vital Signs in the last 24 hours: BP 157/80 mmHg  Pulse 95  Temp(Src) 98.1 F (36.7 C) (Oral)  Resp 16  Ht  (1.854 m)  Wt 89 kg (196 lb 3.4 oz)  BMI 25.89 kg/m2  SpO2 95%  Physical Exam: Elderly male currently in NAD  Lungs:  Clear  Cardiac:  Regular rhythm, normal S1 and S2, no S3 Extremities:  Left hemiparesis noted   Intake/Output from previous day: 09/27 0701 - 09/28 0700 In: -  Out: 2200 [Urine:2200] Weight Filed Weights   09/13/15 2008 09/14/15 0417 09/15/15 0500  Weight: 88.3 kg (194 lb 10.7 oz) 86.4 kg (190 lb 7.6 oz) 89 kg (196 lb 3.4 oz)    Lab Results: Basic Metabolic Panel:  Recent Labs  40/98/11 0532 09/15/15 0726  NA 137 135  K 3.3* 3.4*  CL 104 102  CO2 27 27  GLUCOSE 154* 129*  BUN 19 14  CREATININE 0.74 0.74    CBC:  Recent Labs  09/13/15 0230 09/14/15 0532  WBC 8.1 7.3  HGB 11.0* 10.6*  HCT 32.7* 31.8*  MCV 88.4 89.6  PLT 106* 130*   Telemetry: Sinus rhythm now.  Atrial fib with moderate ventricular response earlier today.  Assessment/Plan:  1.  Paroxysmal atrial fibrillation recurrent  2.  Hypertension 3.  Diabetes 4.  Peripheral vascular disease 5.  Recent right MCA stroke either watershed or embolic  Recommendations:  1.  Imperative that he not miss doses of beta blocker.   If unable to take per tube then we'll need to metoprolol 5 mg IV every 6 hours 2.  In another week need to begin oral anticoagulation because of the history of atrial fibrillation and stroke.  Would favor using Eliquus if okay with surgery.  Dose per pharmacy.     Darden Palmer  MD [facility]  09/15/2015, 9:05 AM

## 2015-09-15 NOTE — Progress Notes (Signed)
Overall stable. No new issues overnight. Awake and alert. Denies significant pain. Left hemiparesis unchanged. Moving her right side well. Wounds clean and dry. Brief episode of A. fib last night but currently in sinus rhythm. All electrolytes okay.  Continue current care.

## 2015-09-15 NOTE — Progress Notes (Signed)
Speech Language Pathology Treatment: Dysphagia  Patient Details Name: [name] MRN: [id number] DOB: [birth year] Today's Date: 09/15/2015 Time: 0800-0820 SLP Time Calculation (min) (ACUTE ONLY): 20 min  Assessment / Plan / Recommendation Clinical Impression  During po trials of nectar and ice chips, pt demonstrated overt signs/ symptoms of aspiration (wet vocal quality and immediate throat clear). SLP provided oral care, pt is progressing as evidenced by better secretion management and productive throat clear. SLP provided therapeutic exercise, effortful swallow (5x), and supraglottic swallow. Pt unable to do supraglottic swallow despite instruction. Pt has a severe risk of aspiration, recommend staying NPO. Will f/u with po trials and therapeutic exercise.    HPI Other Pertinent Information: [name] is an 82 y.o. male with a past medical history significant for HTN, DM, hypercholesterolemia, PAD, GERD, who underwent C2-3 and C4-5 anterior cervical discectomy with interbody fusion as well as L3-4 decompressive laminectomy with bilateral L3 and L4 foraminotomies on 9/19, and few hours postoperatively was noted to have significant left hemiparesis that prompted CT brain which demonstrated findings concerning for acute ischemia in the right MCA territory. Pt remained on the vent post-operatively 9/19-9/21.   Pertinent Vitals Pain Assessment: Faces Faces Pain Scale: No hurt  SLP Plan  Continue with current plan of care    Recommendations Diet recommendations: NPO Medication Administration: Via alternative means              Oral Care Recommendations: Oral care BID Follow up Recommendations: Inpatient Rehab Plan: Continue with current plan of care    GO    Riccardo Dubin, Student-SLP  Riccardo Dubin 09/15/2015, 9:11 AM

## 2015-09-15 NOTE — Progress Notes (Signed)
Pt returned to unit approximately 1500. New NG tube present to L nare. Flushes well. Dietician advises to restart tube feeding at 68mls/hr with free water flushes 65mls/4hrs. New tube feeding set up. Lawson Radar

## 2015-09-15 NOTE — Clinical Social Work Note (Signed)
Patient has a bed at [facility] (as requested by family) once medically stable for transfer. CSW will continue to follow patient and pt's family for continued support and to facilitate pt's discharge needs once medically stable.   Derenda Fennel, MSW, LCSWA [phone] 09/15/2015 12:42 PM

## 2015-09-16 LAB — GLUCOSE, CAPILLARY
GLUCOSE-CAPILLARY: 148 mg/dL — AB (ref 65–99)
GLUCOSE-CAPILLARY: 180 mg/dL — AB (ref 65–99)
Glucose-Capillary: 245 mg/dL — ABNORMAL HIGH (ref 65–99)
Glucose-Capillary: 114 mg/dL — ABNORMAL HIGH (ref 65–99)

## 2015-09-16 LAB — BASIC METABOLIC PANEL
ANION GAP: 7 (ref 5–15)
BUN: 16 mg/dL (ref 6–20)
CALCIUM: 8 mg/dL — AB (ref 8.9–10.3)
CO2: 25 mmol/L (ref 22–32)
Chloride: 104 mmol/L (ref 101–111)
Creatinine, Ser: 0.7 mg/dL (ref 0.61–1.24)
Glucose, Bld: 206 mg/dL — ABNORMAL HIGH (ref 65–99)
POTASSIUM: 4.2 mmol/L (ref 3.5–5.1)
SODIUM: 136 mmol/L (ref 135–145)

## 2015-09-16 LAB — CULTURE, BLOOD (ROUTINE X 2)
Culture: NO GROWTH
Culture: NO GROWTH

## 2015-09-16 NOTE — Progress Notes (Signed)
Speech Language Pathology Treatment: Dysphagia  Patient Details Name: [name] MRN: [id number] DOB: September 16, 1930 Today's Date: 09/16/2015 Time: 0115-0130 SLP Time Calculation (min) (ACUTE ONLY): 15 min  Assessment / Plan / Recommendation Clinical Impression  Pt demonstrated improved swallow function during po trials of nectar and ice chips. While pt demonstrated overt s/sx of sensed aspiration with ice chips (immediate throat clear and wet vocal quality), pt did not demonstrate any s/sx of aspiration with small sips of nectar (pt previously had immediate throat clear with nectar). Prior to po trials, SLP provided oral care and moderate verbal cues for effortful swallows. Pt demonstrated effortful swallows of ice chips and nectar. Pt still has a high risk of aspiration, pending objective swallow study, MBS, pt may be able to upgrade to a restrictive diet. SLP will f/u with MBS and upgraded po trials.    HPI Other Pertinent Information: [name] is an 81 y.o. male with a past medical history significant for HTN, DM, hypercholesterolemia, PAD, GERD, who underwent C2-3 and C4-5 anterior cervical discectomy with interbody fusion as well as L3-4 decompressive laminectomy with bilateral L3 and L4 foraminotomies on 9/19, and few hours postoperatively was noted to have significant left hemiparesis that prompted CT brain which demonstrated findings concerning for acute ischemia in the right MCA territory. Pt remained on the vent post-operatively 9/19-9/21.   Pertinent Vitals Pain Assessment: Faces Faces Pain Scale: No hurt  SLP Plan  New goals to be determined pending instrumental study    Recommendations Diet recommendations: NPO Medication Administration: Via alternative means              Oral Care Recommendations: Oral care QID Follow up Recommendations: Inpatient Rehab Plan: New goals to be determined pending instrumental study    GO    Riccardo Dubin, Student-SLP  Riccardo Dubin 09/16/2015, 1:44 PM

## 2015-09-16 NOTE — Progress Notes (Signed)
[facility] hospital[facility] consultation follow-up progress note  PATIENT DETAILS Name: [name] Age: 81 y.o. Sex: male Date of Birth: [birth year] Admit Date: 09/06/2015 Admitting Physician Julio Sicks, MD ZOX:[id number], Chancy Hurter, MD  Brief narrative:  [name] is a 81 y.o. male with history of HTN, DM, hypercholesterolemia, PAD, GERD, who presented to [name] on 9/19 for surgery to remedy progressive bilateral upper and lower extremity numbness from critical cervical and lumbar stenosis. Patient underwent anterior cervical decompression and lumbar decompression with laminectomy on 9/19. Postoperatively, patient remainrf on the ventilator in the intensive care unit. Unfortunately patient was noted to have dense left-sided hemiparesis, and also some concern for angioedema. Further workup demonstrated acute ischemia in the right MCA territory. Hospital course was further complicated by development of atrial fibrillation, presumed aspiration pneumonia and UTI. He unfortunately also developed significant dysphagia, and remains nothing by mouth with a panda tube in place. Patient was subsequently transferred to the medical surgical unit on 9/29, and [facility] was consulted to manage patient's ongoing medical issues.   Significant events during this hospital stay: 9/19 >>to OR for C2-C3 and C4-5 anterior anterior cervical discectomy with interbody fusion, anterior instrumentation at C2-3 and C4-5, L3-4 decompressive laminectomy with bilateral L3 and l4 foraminotomies. Returned to ICU on vent. 9/19>>Noted to have left hemiparesis-CT head -Suggestive of rt MCA infarct. 9/19 >> 9/21 OETT  9/25>> afib with rvr cards consult  Subjective: Awake, alert. Follows commands. Still with neglect of the left side. Panda tube in place.  Assessment/Plan: Principal Problem: Spinal stenosis in cervical/Lumbar region:s/p  C2-C3 and C4-5 anterior anterior cervical discectomy with  interbody fusion, anterior instrumentation at C2-3 and C4-5, L3-4 decompressive laminectomy with bilateral L3 and l4 foraminotomies.Cervical collar remains in place. Neurosurgery following  Active Problems: Acute Large right MCA CVA with scattered right PCA as well as punctate left MCA and right SCA : Continues to have dense left hemiparesis and hemi-neglect. Echo showed EF around 60-65%, carotid Doppler showed Bilateral ICA origin stenosis of up to 60% in the neck. Given atrial fibrillation, will need to start NOAC's-neurology recommending to wait 2 weeks before initiating anticoagulation as significant risk for hemorrhagic transition. Tentative NOAC start date 10/5. In the meantime-continue aspirin, Plavix and statin-once anticoagulation started aspirin and Plavix will need to be discontinued. Patient will need outpatient vascular surgery follow-up for carotid artery Doppler monitoring and CEA as indicated. Note LDL 41 (goal<70), A1c 6.1  Atrial fibrillation with RVR: Rate controlled with beta blocker. See above regarding anticoagulation. Cardiology following  Escherichia coli UTI: Afebrile, no leukocytosis. Continue Bactrim-stop date 10/4  Presumed aspiration pneumonia: Afebrile, no leukocytosis, continue Levaquin-stop date 10/1  Oral thrush:Continue diflucan-stop date 10/4   Dysphagia: Multifactorial-suspect secondary to cervical surgery, critical illness, and possible CVA. Continue PANDA tube for now, will likely require PEG tube placement if dysphagia persists. Palliative care also consulted.  Hypokalemia: Repleted, will require periodic monitoring.  Dyslipidemia: Continue statin-LDL 41 (goal<70)  Type 2 diabetes: CBGs stable, continue suicide. Resume metformin on discharge. A1c 6.1  Hypertension: Controlled-continue metoprolol. Follow and adjust accordingly  Bilateral carotid artery stenosis: See above. Please show follow-up with vascular surgery on discharge  Tobacco abuse disorder:  Will need ongoing counseling.  Disposition: Remain inpatient-SNF eventually-suspect next week  Antimicrobial agents  See below  Anti-infectives    Start     Dose/Rate Route Frequency Ordered Stop   09/15/15 1015  fluconazole (  DIFLUCAN) tablet 100 mg  Status:  Discontinued     100 mg Oral Daily 09/15/15 1007 09/15/15 1008   09/15/15 1015  fluconazole (DIFLUCAN) tablet 100 mg     100 mg Per Tube Daily 09/15/15 1008     09/15/15 1000  levofloxacin (LEVAQUIN) IVPB 750 mg     750 mg 100 mL/hr over 90 Minutes Intravenous Every 24 hours 09/14/15 1157     09/14/15 1300  sulfamethoxazole-trimethoprim (BACTRIM,SEPTRA) 200-40 MG/5ML suspension 20 mL     20 mL Per Tube Every 12 hours 09/14/15 1201     09/12/15 1000  levofloxacin (LEVAQUIN) IVPB 500 mg  Status:  Discontinued     500 mg 100 mL/hr over 60 Minutes Intravenous Every 24 hours 09/12/15 0954 09/14/15 1157   09/10/15 1800  vancomycin (VANCOCIN) 1,250 mg in sodium chloride 0.9 % 250 mL IVPB  Status:  Discontinued     1,250 mg 166.7 mL/hr over 90 Minutes Intravenous Every 24 hours 09/09/15 2024 09/11/15 1122   09/06/15 1830  vancomycin (VANCOCIN) IVPB 1000 mg/200 mL premix  Status:  Discontinued     1,000 mg 200 mL/hr over 60 Minutes Intravenous Every 12 hours 09/06/15 1534 09/09/15 2024   09/06/15 0715  bacitracin 50,000 Units in sodium chloride irrigation 0.9 % 500 mL irrigation  Status:  Discontinued       As needed 09/06/15 0900 09/06/15 1300   09/06/15 0644  vancomycin (VANCOCIN) IVPB 1000 mg/200 mL premix     1,000 mg 200 mL/hr over 60 Minutes Intravenous On call to O.R. 09/06/15 0454 09/06/15 0981      DVT Prophylaxis: Prophylactic Heparin   Code Status: Full code   Family Communication None at bedside  Procedures: 9/19 >>to OR for C2-C3 and C4-5 anterior anterior cervical discectomy with interbody fusion, anterior instrumentation at C2-3 and C4-5, L3-4 decompressive laminectomy with bilateral L3 and l4  foraminotomies. 9/19 >> 9/21 OETT   CONSULTS:  cardiology, pulmonary/intensive care and Palliative  Time spent 40 minutes-Greater than 50% of this time was spent in counseling, explanation of diagnosis, planning of further management, and coordination of care.  MEDICATIONS: Scheduled Meds: . antiseptic oral rinse  7 mL Mouth Rinse q12n4p  . aspirin EC  81 mg Oral Daily  . chlorhexidine  15 mL Mouth Rinse BID  . clopidogrel  75 mg Oral Daily  . feeding supplement (PRO-STAT SUGAR FREE 64)  30 mL Per Tube BID  . fluconazole  100 mg Per Tube Daily  . folic acid  1 mg Oral Daily  . heparin subcutaneous  5,000 Units Subcutaneous 3 times per day  . insulin aspart  0-15 Units Subcutaneous 6 times per day  . levofloxacin (LEVAQUIN) IV  750 mg Intravenous Q24H  . metoprolol tartrate  25 mg Oral BID  . pantoprazole (PROTONIX) IV  40 mg Intravenous QHS  . potassium chloride  40 mEq Per Tube Once  . rosuvastatin  10 mg Oral Daily  . sulfamethoxazole-trimethoprim  20 mL Per Tube Q12H   Continuous Infusions: . feeding supplement (JEVITY 1.2 CAL) 1,000 mL (09/14/15 0417)  . phenylephrine (NEO-SYNEPHRINE) Adult infusion Stopped (09/06/15 2115)  . sodium chloride 0.9 % 1,000 mL with potassium chloride 40 mEq infusion 75 mL/hr at 09/15/15 1804   PRN Meds:.acetaminophen **OR** acetaminophen, cyclobenzaprine, hydrochlorothiazide, iohexol, iohexol, menthol-cetylpyridinium **OR** phenol, metoprolol, midazolam, midazolam, ondansetron (ZOFRAN) IV    PHYSICAL EXAM: Vital signs in last 24 hours: Filed Vitals:   09/15/15 2200 09/16/15 0144 09/16/15 0532 09/16/15 1914  BP: 164/66 148/75 151/72 155/66  Pulse: 102 78 93 93  Temp: 99.5 F (37.5 C) 98.9 F (37.2 C) 98.5 F (36.9 C) 98.5 F (36.9 C)  TempSrc: Oral Oral Oral Oral  Resp: 20 20 20 20   Height:      Weight:   87.2 kg (192 lb 3.9 oz)   SpO2: 99% 98% 98% 98%    Weight change: -1.8 kg (-3 lb 15.5 oz) Filed Weights   09/14/15 0417  09/15/15 0500 09/16/15 0532  Weight: 86.4 kg (190 lb 7.6 oz) 89 kg (196 lb 3.4 oz) 87.2 kg (192 lb 3.9 oz)   Body mass index is 25.37 kg/(m^2).   Gen Exam: Awake and alert-somewhat dysarthric. Panda tube in place, cervical collar in place Chest: B/L Clear anteriorly CVS: S1 S2 Regular Abdomen: soft, BS +, non tender, non distended.  Extremities: no edema, lower extremities warm to touch. Neurologic: Dense left hemiparesis Skin: No Rash.   Wounds: N/A.    Intake/Output from previous day:  Intake/Output Summary (Last 24 hours) at 09/16/15 1133 Last data filed at 09/16/15 0735  Gross per 24 hour  Intake      0 ml  Output   3350 ml  Net  -3350 ml     LAB RESULTS: CBC  Recent Labs Lab 09/10/15 0242 09/11/15 0220 09/11/15 2315 09/13/15 0230 09/14/15 0532  WBC 7.7 9.1 10.0 8.1 7.3  HGB 12.0* 11.9* 11.9* 11.0* 10.6*  HCT 34.5* 34.7* 34.3* 32.7* 31.8*  PLT 102* 91* 95* 106* 130*  MCV 87.6 87.0 87.7 88.4 89.6  MCH 30.5 29.8 30.4 29.7 29.9  MCHC 34.8 34.3 34.7 33.6 33.3  RDW 14.6 14.6 14.9 15.0 15.0  LYMPHSABS  --   --  0.6*  --   --   MONOABS  --   --  1.1*  --   --   EOSABS  --   --  0.0  --   --   BASOSABS  --   --  0.0  --   --     Chemistries   Recent Labs Lab 09/11/15 0220 09/11/15 1959 09/13/15 0230 09/14/15 0532 09/15/15 0726 09/16/15 0405  NA 136 139 142 137 135 136  K 3.3* 3.5 3.4* 3.3* 3.4* 4.2  CL 102 106 106 104 102 104  CO2 26 27 28 27 27 25   GLUCOSE 162* 141* 133* 154* 129* 206*  BUN 17 16 14 19 14 16   CREATININE 0.73 0.76 0.69 0.74 0.74 0.70  CALCIUM 7.8* 8.2* 8.3* 7.8* 7.9* 8.0*  MG 1.6*  --   --  1.6* 1.6*  --     CBG:  Recent Labs Lab 09/15/15 1100 09/15/15 1543 09/15/15 2017 09/16/15 0004 09/16/15 0408  GLUCAP 113* 146* 146* 148* 180*    GFR Estimated Creatinine Clearance: 79.1 mL/min (by C-G formula based on Cr of 0.7).  Coagulation profile No results for input(s): INR, PROTIME in the last 168 hours.  Cardiac  Enzymes  Recent Labs Lab 09/11/15 1959  CKMB 2.0  TROPONINI <0.03    Invalid input(s): POCBNP No results for input(s): DDIMER in the last 72 hours. No results for input(s): HGBA1C in the last 72 hours. No results for input(s): CHOL, HDL, LDLCALC, TRIG, CHOLHDL, LDLDIRECT in the last 72 hours. No results for input(s): TSH, T4TOTAL, T3FREE, THYROIDAB in the last 72 hours.  Invalid input(s): FREET3 No results for input(s): VITAMINB12, FOLATE, FERRITIN, TIBC, IRON, RETICCTPCT in the last 72 hours. No results for input(s): LIPASE, AMYLASE  in the last 72 hours.  Urine Studies No results for input(s): UHGB, CRYS in the last 72 hours.  Invalid input(s): UACOL, UAPR, USPG, UPH, UTP, UGL, UKET, UBIL, UNIT, UROB, ULEU, UEPI, UWBC, URBC, UBAC, CAST, UCOM, BILUA  MICROBIOLOGY: Recent Results (from the past 240 hour(s))  Culture, blood (routine x 2)     Status: None (Preliminary result)   Collection Time: 09/11/15 10:59 PM  Result Value Ref Range Status   Specimen Description BLOOD LEFT HAND  Final   Special Requests BOTTLES DRAWN AEROBIC AND ANAEROBIC 5CC  Final   Culture NO GROWTH 4 DAYS  Final   Report Status PENDING  Incomplete  Culture, blood (routine x 2)     Status: None (Preliminary result)   Collection Time: 09/11/15 11:09 PM  Result Value Ref Range Status   Specimen Description BLOOD RIGHT HAND  Final   Special Requests IN PEDIATRIC BOTTLE 1CC  Final   Culture NO GROWTH 4 DAYS  Final   Report Status PENDING  Incomplete  Culture, Urine     Status: None   Collection Time: 09/11/15 11:54 PM  Result Value Ref Range Status   Specimen Description URINE, RANDOM  Final   Special Requests NONE  Final   Culture 50,000 COLONIES/mL YEAST  Final   Report Status 09/13/2015 FINAL  Final  Culture, Urine     Status: None   Collection Time: 09/12/15 12:45 PM  Result Value Ref Range Status   Specimen Description URINE, CLEAN CATCH  Final   Special Requests Normal  Final   Culture  >=100,000 COLONIES/mL ESCHERICHIA COLI  Final   Report Status 09/14/2015 FINAL  Final   Organism ID, Bacteria ESCHERICHIA COLI  Final      Susceptibility   Escherichia coli - MIC*    AMPICILLIN >=32 RESISTANT Resistant     CEFAZOLIN <=4 SENSITIVE Sensitive     CEFTRIAXONE <=1 SENSITIVE Sensitive     CIPROFLOXACIN >=4 RESISTANT Resistant     GENTAMICIN <=1 SENSITIVE Sensitive     IMIPENEM <=0.25 SENSITIVE Sensitive     NITROFURANTOIN <=16 SENSITIVE Sensitive     TRIMETH/SULFA <=20 SENSITIVE Sensitive     AMPICILLIN/SULBACTAM >=32 RESISTANT Resistant     PIP/TAZO <=4 SENSITIVE Sensitive     * >=100,000 COLONIES/mL ESCHERICHIA COLI    RADIOLOGY STUDIES/RESULTS: Dg Cervical Spine 1 View  09/06/2015   CLINICAL DATA:  ACDF C2-3 and C4-5  EXAM: DG C-ARM 61-120 MIN; DG CERVICAL SPINE - 1 VIEW  COMPARISON:  None.  FLUOROSCOPY TIME:  10 seconds  One image  FINDINGS: Single lateral intraoperative fluoroscopic image is provided of the cervical spine. Anterior cervical fusion with interbody spacer device at C2-3 and C4-5.  IMPRESSION: ACDF C2-3 and C4-5.   Electronically Signed   By: Elige Ko   On: 09/06/2015 12:43   Dg Abd 1 View  09/15/2015   CLINICAL DATA:  Feeding tube placement  EXAM: ABDOMEN - 1 VIEW  COMPARISON:  09/13/2015  TECHNIQUE: Twelve French feeding tube placed under fluoroscopy by Delice Bison Dingus RT-R.  20 mL of Omnipaque 300 was utilized to confirm placement.  FLUOROSCOPY TIME:  5 minutes 12 seconds  FINDINGS: Contrast opacifies second third portions the duodenum as well as RIGHT proximal jejunal loop.  Feeding tube is placed with the tip at/beyond ligament of Treitz.  IMPRESSION: Placement of feeding tube with tip at/beyond ligament of Treitz.   Electronically Signed   By: Ulyses Southward M.D.   On: 09/15/2015 14:49  Dg Abd 1 View  09/10/2015   CLINICAL DATA:  Nasoenteric feeding tube placement by RT  EXAM: ABDOMEN - 1 VIEW  COMPARISON:  06/15/2006  FINDINGS: Feeding tube extends to the  ligament of Treitz, confirmed with contrast injection.  IMPRESSION: Feeding tube placement to ligament of Treitz.   Electronically Signed   By: Corlis Leak M.D.   On: 09/10/2015 13:26   Ct Head Wo Contrast  09/06/2015   CLINICAL DATA:  Left-sided weakness following cervical surgery  EXAM: CT HEAD WITHOUT CONTRAST  TECHNIQUE: Contiguous axial images were obtained from the base of the skull through the vertex without intravenous contrast.  COMPARISON:  06/27/2013  FINDINGS: The bony calvarium is intact. No gross soft tissue abnormality is seen. Mild atrophic changes are noted. There is some generalized decreased attenuation identified in the distribution of the right middle cerebral artery suggestive of a early ischemic event. MRI may be helpful further evaluation. No other focal areas of ischemia or hemorrhage are noted.  IMPRESSION: Chronic atrophic changes.  Generalized decreased attenuation in the distribution of the right middle cerebral artery suggestive of early ischemia. MRI may be helpful for further evaluation.  These results were called by telephone at the time of interpretation on 09/06/2015 at 5:34 pm to [facility], the pts nurse, who verbally acknowledged these results.   Electronically Signed   By: Alcide Clever M.D.   On: 09/06/2015 17:37   Mr Shirlee Latch Wo Contrast  09/08/2015   CLINICAL DATA:  81 year old male with left side weakness following cervical spine and lumbar surgery. Initial encounter.  EXAM: MRI HEAD WITHOUT CONTRAST AND WITH CONTRAST  MRA HEAD WITHOUT CONTRAST  MRA NECK WITHOUT AND WITH CONTRAST  TECHNIQUE: Multiplanar, multiecho pulse sequences of the brain and surrounding structures were obtained without intravenous contrast. Angiographic images of the Circle of Willis were obtained using MRA technique without intravenous contrast. Angiographic images of the neck were obtained using MRA technique without and with intravenous contrast. Carotid stenosis measurements (when applicable) are  obtained utilizing NASCET criteria, using the distal internal carotid diameter as the denominator.  CONTRAST:  20mL MULTIHANCE GADOBENATE DIMEGLUMINE 529 MG/ML IV SOLN  COMPARISON:  Head CT without contrast 09/06/2015. Preoperative Cervical spine MRI 08/12/2015  FINDINGS: MRI HEAD FINDINGS  Confluent restricted diffusion throughout most of the right hemisphere. Near complete involvement of the right MCA territory. Right MCA/PCA watershed involvement extending to the lateral occipital pole. Scattered involvement elsewhere in the right PCA territory. Some of the right temporal lobe also is spared.  Scattered small areas of restricted diffusion also in the left MCA territory, an the right superior cerebellar artery territory.  Major intracranial vascular flow voids are preserved, see MRA findings below.  Confluent cytotoxic edema in the right hemisphere. Areas of petechial hemorrhage in the anterior and posterior right MCA territories. Mass effect on the right lateral ventricle. Mild leftward midline shift of 3 mm. No ventriculomegaly. Basilar cisterns are patent. No extra-axial or intraventricular hemorrhage. Following contrast, no significant post ischemic enhancement.  Minimal other nonspecific cerebral white matter T2 and FLAIR hyperintensity. There is a small chronic lacunar infarct in the right PICA territory (series 6, image 5).  Visible internal auditory structures appear normal. Trace mastoid fluid. Mild paranasal sinus mucosal thickening. The patient is intubated. Trace fluid in the oral cavity. Postoperative changes to the globes. Negative scalp soft tissues.  Hardware susceptibility artifact in the cervical spine. Chronic spinal Stenosis with mass effect on the spinal cord at C2-C3 (series 3,  image 13).  MRA NECK FINDINGS  Precontrast time-of-flight imaging. A antegrade flow in both carotid and vertebral arteries in the neck and to the skullbase.  Post-contrast neck MRA images. Three vessel arch  configuration. No great vessel origin stenosis.  Negative right CCA proximal to the right carotid bifurcation. At the right ICA origin there is irregularity and stenosis of up to 60 % with respect to the distal vessel (series 1302, image 84 and series 16109, image 5). The cervical right ICA is patent and otherwise appears negative to the skullbase.  Negative left CCA. Irregularity and stenosis at the left ICA origin also measuring up to 60 % with respect to the distal vessel and best seen on series 1302, image 71. The left ICA remains patent and is otherwise negative to the skullbase.  No proximal subclavian artery stenosis. Normal left vertebral artery origin. Mild if any stenosis at the right vertebral artery origin. The left vertebral artery is mildly dominant throughout.  MRA HEAD FINDINGS  Antegrade flow in the posterior circulation. Dominant distal left vertebral artery. Normal right PICA origin. Dominant appearing left AICA with a patent origin. Patent vertebrobasilar junction. No basilar stenosis. Normal SCA and PCA origins, fetal type on the right. Both posterior communicating arteries are present. Bilateral PCA branches are within normal limits.  Antegrade flow in both ICA siphons. No siphon stenosis. Ophthalmic and posterior communicating artery origins are within normal limits. Small probable atherosclerotic pseudo lesion of the left ICA siphon in the superior hypophyseal region (series 505, image 11). Patent carotid termini. Normal MCA and ACA origins.  Anterior communicating artery and visualized ACA branches are within normal limits. Left MCA M1 segment and bifurcation are within normal limits. No left MCA branch occlusion identified. Right MCA M1 segment and bifurcation are within normal limits. No major right MCA branch occlusion identified.  IMPRESSION: 1. Large right MCA and partial right PCA infarcts with mild petechial hemorrhage, cytotoxic edema, and mild intracranial mass effect with leftward  midline shift of 3 mm. 2. Scattered small left MCA and occasional right SCA acute infarcts. 3. Bilateral ICA origin stenosis of up to 60% in the neck. No emergent large vessel occlusion. No intracranial stenosis or MCA branch occlusion identified. 4. Chronic spinal stenosis with spinal cord mass effect at C2-C3. New upper cervical fusion hardware.   Electronically Signed   By: Odessa Fleming M.D.   On: 09/08/2015 10:20   Mr Angiogram Neck W Wo Contrast  09/08/2015   CLINICAL DATA:  81 year old male with left side weakness following cervical spine and lumbar surgery. Initial encounter.  EXAM: MRI HEAD WITHOUT CONTRAST AND WITH CONTRAST  MRA HEAD WITHOUT CONTRAST  MRA NECK WITHOUT AND WITH CONTRAST  TECHNIQUE: Multiplanar, multiecho pulse sequences of the brain and surrounding structures were obtained without intravenous contrast. Angiographic images of the Circle of Willis were obtained using MRA technique without intravenous contrast. Angiographic images of the neck were obtained using MRA technique without and with intravenous contrast. Carotid stenosis measurements (when applicable) are obtained utilizing NASCET criteria, using the distal internal carotid diameter as the denominator.  CONTRAST:  20mL MULTIHANCE GADOBENATE DIMEGLUMINE 529 MG/ML IV SOLN  COMPARISON:  Head CT without contrast 09/06/2015. Preoperative Cervical spine MRI 08/12/2015  FINDINGS: MRI HEAD FINDINGS  Confluent restricted diffusion throughout most of the right hemisphere. Near complete involvement of the right MCA territory. Right MCA/PCA watershed involvement extending to the lateral occipital pole. Scattered involvement elsewhere in the right PCA territory. Some of the right temporal  lobe also is spared.  Scattered small areas of restricted diffusion also in the left MCA territory, an the right superior cerebellar artery territory.  Major intracranial vascular flow voids are preserved, see MRA findings below.  Confluent cytotoxic edema in the  right hemisphere. Areas of petechial hemorrhage in the anterior and posterior right MCA territories. Mass effect on the right lateral ventricle. Mild leftward midline shift of 3 mm. No ventriculomegaly. Basilar cisterns are patent. No extra-axial or intraventricular hemorrhage. Following contrast, no significant post ischemic enhancement.  Minimal other nonspecific cerebral white matter T2 and FLAIR hyperintensity. There is a small chronic lacunar infarct in the right PICA territory (series 6, image 5).  Visible internal auditory structures appear normal. Trace mastoid fluid. Mild paranasal sinus mucosal thickening. The patient is intubated. Trace fluid in the oral cavity. Postoperative changes to the globes. Negative scalp soft tissues.  Hardware susceptibility artifact in the cervical spine. Chronic spinal Stenosis with mass effect on the spinal cord at C2-C3 (series 3, image 13).  MRA NECK FINDINGS  Precontrast time-of-flight imaging. A antegrade flow in both carotid and vertebral arteries in the neck and to the skullbase.  Post-contrast neck MRA images. Three vessel arch configuration. No great vessel origin stenosis.  Negative right CCA proximal to the right carotid bifurcation. At the right ICA origin there is irregularity and stenosis of up to 60 % with respect to the distal vessel (series 1302, image 84 and series 16109, image 5). The cervical right ICA is patent and otherwise appears negative to the skullbase.  Negative left CCA. Irregularity and stenosis at the left ICA origin also measuring up to 60 % with respect to the distal vessel and best seen on series 1302, image 71. The left ICA remains patent and is otherwise negative to the skullbase.  No proximal subclavian artery stenosis. Normal left vertebral artery origin. Mild if any stenosis at the right vertebral artery origin. The left vertebral artery is mildly dominant throughout.  MRA HEAD FINDINGS  Antegrade flow in the posterior circulation.  Dominant distal left vertebral artery. Normal right PICA origin. Dominant appearing left AICA with a patent origin. Patent vertebrobasilar junction. No basilar stenosis. Normal SCA and PCA origins, fetal type on the right. Both posterior communicating arteries are present. Bilateral PCA branches are within normal limits.  Antegrade flow in both ICA siphons. No siphon stenosis. Ophthalmic and posterior communicating artery origins are within normal limits. Small probable atherosclerotic pseudo lesion of the left ICA siphon in the superior hypophyseal region (series 505, image 11). Patent carotid termini. Normal MCA and ACA origins.  Anterior communicating artery and visualized ACA branches are within normal limits. Left MCA M1 segment and bifurcation are within normal limits. No left MCA branch occlusion identified. Right MCA M1 segment and bifurcation are within normal limits. No major right MCA branch occlusion identified.  IMPRESSION: 1. 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 2. Scattered small left MCA and occasional right SCA acute infarcts. 3. Bilateral ICA origin stenosis of up to 60% in the neck. No emergent large vessel occlusion. No intracranial stenosis or MCA branch occlusion identified. 4. Chronic spinal stenosis with spinal cord mass effect at C2-C3. New upper cervical fusion hardware.   Electronically Signed   By: Odessa Fleming M.D.   On: 09/08/2015 10:20   Mr Laqueta Jean UE Contrast  09/08/2015   CLINICAL DATA:  81 year old  male with left side weakness following cervical spine and lumbar surgery. Initial encounter.  EXAM: MRI HEAD WITHOUT CONTRAST AND WITH CONTRAST  MRA HEAD WITHOUT CONTRAST  MRA NECK WITHOUT AND WITH CONTRAST  TECHNIQUE: Multiplanar, multiecho pulse sequences of the brain and surrounding structures were obtained without intravenous contrast. Angiographic images of  the Circle of Willis were obtained using MRA technique without intravenous contrast. Angiographic images of the neck were obtained using MRA technique without and with intravenous contrast. Carotid stenosis measurements (when applicable) are obtained utilizing NASCET criteria, using the distal internal carotid diameter as the denominator.  CONTRAST:  20mL MULTIHANCE GADOBENATE DIMEGLUMINE 529 MG/ML IV SOLN  COMPARISON:  Head CT without contrast 09/06/2015. Preoperative Cervical spine MRI 08/12/2015  FINDINGS: MRI HEAD FINDINGS  Confluent restricted diffusion throughout most of the right hemisphere. Near complete involvement of the right MCA territory. Right MCA/PCA watershed involvement extending to the lateral occipital pole. Scattered involvement elsewhere in the right PCA territory. Some of the right temporal lobe also is spared.  Scattered small areas of restricted diffusion also in the left MCA territory, an the right superior cerebellar artery territory.  Major intracranial vascular flow voids are preserved, see MRA findings below.  Confluent cytotoxic edema in the right hemisphere. Areas of petechial hemorrhage in the anterior and posterior right MCA territories. Mass effect on the right lateral ventricle. Mild leftward midline shift of 3 mm. No ventriculomegaly. Basilar cisterns are patent. No extra-axial or intraventricular hemorrhage. Following contrast, no significant post ischemic enhancement.  Minimal other nonspecific cerebral white matter T2 and FLAIR hyperintensity. There is a small chronic lacunar infarct in the right PICA territory (series 6, image 5).  Visible internal auditory structures appear normal. Trace mastoid fluid. Mild paranasal sinus mucosal thickening. The patient is intubated. Trace fluid in the oral cavity. Postoperative changes to the globes. Negative scalp soft tissues.  Hardware susceptibility artifact in the cervical spine. Chronic spinal Stenosis with mass effect on the spinal  cord at C2-C3 (series 3, image 13).  MRA NECK FINDINGS  Precontrast time-of-flight imaging. A antegrade flow in both carotid and vertebral arteries in the neck and to the skullbase.  Post-contrast neck MRA images. Three vessel arch configuration. No great vessel origin stenosis.  Negative right CCA proximal to the right carotid bifurcation. At the right ICA origin there is irregularity and stenosis of up to 60 % with respect to the distal vessel (series 1302, image 84 and series 16109, image 5). The cervical right ICA is patent and otherwise appears negative to the skullbase.  Negative left CCA. Irregularity and stenosis at the left ICA origin also measuring up to 60 % with respect to the distal vessel and best seen on series 1302, image 71. The left ICA remains patent and is otherwise negative to the skullbase.  No proximal subclavian artery stenosis. Normal left vertebral artery origin. Mild if any stenosis at the right vertebral artery origin. The left vertebral artery is mildly dominant throughout.  MRA HEAD FINDINGS  Antegrade flow in the posterior circulation. Dominant distal left vertebral artery. Normal right PICA origin. Dominant appearing left AICA with a patent origin. Patent vertebrobasilar junction. No basilar stenosis. Normal SCA and PCA origins, fetal type on the right. Both posterior communicating arteries are present. Bilateral PCA branches are within normal limits.  Antegrade flow in both ICA siphons. No siphon stenosis. Ophthalmic and posterior communicating artery origins are within normal limits. Small probable atherosclerotic pseudo lesion of the left ICA siphon in the superior hypophyseal  region (series 505, image 11). Patent carotid termini. Normal MCA and ACA origins.  Anterior communicating artery and visualized ACA branches are within normal limits. Left MCA M1 segment and bifurcation are within normal limits. No left MCA branch occlusion identified. Right MCA M1 segment and bifurcation  are within normal limits. No major right MCA branch occlusion identified.  IMPRESSION: 1. 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 2. Scattered small left MCA and occasional right SCA acute infarcts. 3. Bilateral ICA origin stenosis of up to 60% in the neck. No emergent large vessel occlusion. No intracranial stenosis or MCA branch occlusion identified. 4. Chronic spinal stenosis with spinal cord mass effect at C2-C3. New upper cervical fusion hardware.   Electronically Signed   By: Odessa Fleming M.D.   On: 09/08/2015 10:20   Dg Lumbar Spine 1 View  09/06/2015   CLINICAL DATA:  Laminectomy.  EXAM: LUMBAR SPINE - 1 VIEW  COMPARISON:  None.  FINDINGS: Metallic patch that lumbar vertebra are numbered with the lowest lumbar shaped vertebra as L5. This may be a transitional vertebra. 11 mm anterolisthesis of L4 on L5 noted. Metallic markers noted posteriorly at the L4-L5 level  IMPRESSION: Metallic marker noted posteriorly at the L4-L5 level.   Electronically Signed   By: Maisie Fus  Register   On: 09/06/2015 13:28   Dg Chest Port 1 View  09/13/2015   CLINICAL DATA:  81 year old male with respiratory failure. Subsequent encounter.  EXAM: PORTABLE CHEST 1 VIEW  COMPARISON:  09/11/2015.  FINDINGS: Poor inspiration with consolidation lung bases greater on left. This may represent atelectasis or infiltrate and without significant change.  Central pulmonary vascular prominence.  Prominent osteophyte mid thoracic vertebra, better evaluated on 08/12/2015 MR.  Mild cardiomegaly.  No gross pneumothorax.  IMPRESSION: Poor inspiration with consolidation lung bases greater on left. This may represent atelectasis or infiltrate and without significant change.  Central pulmonary vascular prominence.  Prominent osteophyte mid thoracic vertebra, better evaluated on 08/12/2015 MR.  Mild cardiomegaly.   Electronically  Signed   By: Lacy Duverney M.D.   On: 09/13/2015 07:59   Dg Chest Port 1 View  09/12/2015   CLINICAL DATA:  Fever today.  EXAM: PORTABLE CHEST 1 VIEW  COMPARISON:  09/09/2015.  FINDINGS: Normal sized heart. Decreased patchy and linear density at the right lung base. Stable patchy density at the left lung base. Thoracic spine degenerative changes.  IMPRESSION: 1. Persistent patchy density at the left lung base, suspicious for pneumonia. 2. Decreased atelectasis at the right lung base with residual patchy pneumonia or atelectasis   Electronically Signed   By: Beckie Salts M.D.   On: 09/12/2015 03:04   Dg Chest Port 1 View  09/09/2015   CLINICAL DATA:  Acute respiratory failure  EXAM: PORTABLE CHEST - 1 VIEW  COMPARISON:  09/08/2015  FINDINGS: Low lung volumes are present, causing crowding of the pulmonary vasculature. Airspace opacities at the left lung base and left infrahilar region, slightly worsened. Minimal right basilar airspace opacity medially.  Thoracic spondylosis. Borderline enlargement of the cardiopericardial silhouette.  Endotracheal and nasogastric tubes have been removed.  IMPRESSION: 1. Bibasilar airspace opacities, left greater than right, mildly worsened. 2. Borderline enlargement of the cardiopericardial silhouette.   Electronically Signed   By: Gaylyn Rong M.D.   On: 09/09/2015 08:08   Dg Chest Port 1 View  09/08/2015   CLINICAL DATA:  Acute respiratory failure portable chest x-ray of 09/07/2015  EXAM: PORTABLE CHEST - 1 VIEW  COMPARISON:  None.  FINDINGS: There is persistent basilar atelectasis left-greater-than-right. A small left effusion cannot be excluded. The right lung is grossly clear. Endotracheal tube and NG tube remain, unchanged in position.  IMPRESSION: Persistent left basilar atelectasis.  Stable mild cardiomegaly.   Electronically Signed   By: Dwyane Dee M.D.   On: 09/08/2015 08:39   Portable Chest Xray  09/07/2015   CLINICAL DATA:  Respiratory failure,  ventilatory support  EXAM: PORTABLE CHEST - 1 VIEW  COMPARISON:  09/06/2015  FINDINGS: Endotracheal tube 7.8 cm above the carina. NG tube enters the stomach with the tip not visualized. Increased left basilar opacity/atelectasis. Right lung remains clear. No enlarging effusion or pneumothorax. Degenerative changes of the spine. Atherosclerosis of the aorta.  IMPRESSION: Stable support apparatus.  Increased left basilar atelectasis   Electronically Signed   By: Judie Petit.  Shick M.D.   On: 09/07/2015 08:20   Dg Chest Port 1 View  09/06/2015   CLINICAL DATA:  Intubation orogastric tube placement  EXAM: PORTABLE CHEST - 1 VIEW  COMPARISON:  08/01/2010  FINDINGS: Endotracheal tube is 3.9 cm above the carina. Orogastric tube crosses the gastroesophageal junction with the. Mild left base opacity suggesting small effusion. Heart size and vascular pattern normal. Right lung clear. Mild atelectasis left lung base.  IMPRESSION: Lines and tubes as described above.   Electronically Signed   By: Esperanza Heir M.D.   On: 09/06/2015 16:45   Dg Abd Portable 1v  09/13/2015   CLINICAL DATA:  Nasogastric tube placement.  EXAM: PORTABLE ABDOMEN - 1 VIEW  COMPARISON:  09/10/2015 and chest x-ray today.  FINDINGS: There has been placement of a enteric tube with tip over the stomach in the left upper quadrant. Bowel gas pattern is nonobstructive. Persistent left base opacification. Moderate degenerative change of the spine.  IMPRESSION: Nonobstructive bowel gas pattern.  Enteric feeding tube with tip over the stomach in the left upper quadrant.   Electronically Signed   By: Elberta Fortis M.D.   On: 09/13/2015 09:58   Dg Vangie Bicker G Tube Plc W/fl-no Rad  09/15/2015   CLINICAL DATA:    NASO G TUBE PLACEMENT WITH FLUORO  Fluoroscopy was utilized by the requesting physician.  No radiographic  interpretation.    Dg Naso G Tube Plc W/fl-no Rad  09/10/2015   CLINICAL DATA:    NASO G TUBE PLACEMENT WITH FLUORO  Fluoroscopy was utilized by the  requesting physician.  No radiographic  interpretation.    Dg C-arm 1-60 Min  09/06/2015   CLINICAL DATA:  ACDF C2-3 and C4-5  EXAM: DG C-ARM 61-120 MIN; DG CERVICAL SPINE - 1 VIEW  COMPARISON:  None.  FLUOROSCOPY TIME:  10 seconds  One image  FINDINGS: Single lateral intraoperative fluoroscopic image is provided of the cervical spine. Anterior cervical fusion with interbody spacer device at C2-3 and C4-5.  IMPRESSION: ACDF C2-3 and C4-5.   Electronically Signed   By: Elige Ko   On: 09/06/2015 12:43    Jeoffrey Massed, MD  [facility] Pager:[phone]  If 7PM-7AM, please contact night-coverage [email or web address] Password [facility] 09/16/2015, 11:33 AM   LOS: 10 days

## 2015-09-16 NOTE — Progress Notes (Signed)
Nutrition Follow-up   INTERVENTION:  Recommend adding anti-diarrheal medication such as immodium Recommend diabetes coordinator consult to adjust insulin regimen while on TF's  Continue to provide Jevity 1.2 @ 60 ml/hr via NGT with 30 ml of Pro-stat BID  Tube feeding regimen provides 1928 kcal, 110 grams of protein, and 1166 ml of H2O.    NUTRITION DIAGNOSIS:   Inadequate oral intake related to inability to eat as evidenced by NPO status.  Ongoing  GOAL:   Patient will meet greater than or equal to 90% of their needs  Met  MONITOR:   TF tolerance, Labs, I & O's, Weight trends  REASON FOR ASSESSMENT:   Consult Enteral/tube feeding initiation and management  ASSESSMENT:   81 year old male with PMH DM, HTN, GERD who had a posterior approach laminectomy then post op patient was unresponsive with facial swelling and a rash so decision was made to leave patient intubated.  Pt has NGT in place with Jevity 1.2 infusion @ 60 ml/hr. RD paged by RN yesterday due to panda tube being clogged; TF were held for about 24 hours while new NGT was placed and TF were re-started yesterday at goal rate. Pt is tolerating TF well though RN reports that patient started having loose stools. Will try anti-diarrheal medication before considering a different tube feeding formula.   Labs: glucose ranging 129 to 245 mg/dL, low calcium, low magnesium  Diet Order:  Diet NPO time specified  Skin:  Wound (see comment) (closed neck incision)  Last BM:  9/29 per RN  Height:   Ht Readings from Last 1 Encounters:  09/13/15 6' 1" (1.854 m)    Weight:   Wt Readings from Last 1 Encounters:  09/16/15 192 lb 3.9 oz (87.2 kg)    Ideal Body Weight:  83.6 kg  BMI:  Body mass index is 25.37 kg/(m^2).  Estimated Nutritional Needs:   Kcal:  1900-2100  Protein:  100-115 grams  Fluid:  > 1.9 L/day  EDUCATION NEEDS:   No education needs identified at this time  [place],  LDN Inpatient Clinical Dietitian Pager: [phone] After Hours Pager: [phone]

## 2015-09-16 NOTE — Progress Notes (Signed)
Overall stable. No complaints of pain or other problems.  Afebrile. Heart rate well-controlled. Resting comfortably. Awakens easily. Answers questions appropriately. Strength in right upper and lower extremity stable. Dense left hemiparesis unchanged. Wounds clean and dry.  Continue current management. Possible discharge to skilled nursing facility early next week.

## 2015-09-16 NOTE — Progress Notes (Signed)
I met briefly with [name] today.  He reports that he is encouraged by small steps in his recovery.    He is agreeable to meeting with our team but would like to have his family present.    I called and spoke with his grandson, [name], and he will talk to his father and call back with a time that they can meet in the next few days.  Dr. Annette Stable noted possible discharge to skilled nursing facility early next week.  I think that he will be well served if we have the opportunity to meet and complete a MOST form prior to going to rehab as this will outline his wishes in a standard document that he can take with him on discharge.  Please let us know if we can be of further assistance in the immediate future.  Micheline Rough, MD [place] Team [phone]

## 2015-09-16 NOTE — Progress Notes (Signed)
Subjective:  No complaints shortness of breath or chest pain today.  Progressing along following stroke and surgery.  Objective:  Vital Signs in the last 24 hours: BP 151/72 mmHg  Pulse 93  Temp(Src) 98.5 F (36.9 C) (Oral)  Resp 20  Ht  (1.854 m)  Wt 87.2 kg (192 lb 3.9 oz)  BMI 25.37 kg/m2  SpO2 98%  Physical Exam: Elderly male currently in NAD  Lungs:  Clear  Cardiac:  Regular rhythm, normal S1 and S2, no S3 Extremities:  Left hemiparesis noted   Intake/Output from previous day: 09/28 0701 - 09/29 0700 In: -  Out: 2650 [Urine:2650] Weight Filed Weights   09/14/15 0417 09/15/15 0500 09/16/15 0532  Weight: 86.4 kg (190 lb 7.6 oz) 89 kg (196 lb 3.4 oz) 87.2 kg (192 lb 3.9 oz)    Lab Results: Basic Metabolic Panel:  Recent Labs  69/62/95 0726 09/16/15 0405  NA 135 136  K 3.4* 4.2  CL 102 104  CO2 27 25  GLUCOSE 129* 206*  BUN 14 16  CREATININE 0.74 0.70    CBC:  Recent Labs  09/14/15 0532  WBC 7.3  HGB 10.6*  HCT 31.8*  MCV 89.6  PLT 130*   Telemetry: Normal sinus rhythm today.  Getting regular doses of beta blockers now.  Assessment/Plan:  1.  Paroxysmal atrial fibrillation recurrent  2.  Hypertension 3.  Diabetes 4.  Peripheral vascular disease 5.  Recent right MCA stroke either watershed or embolic  Recommendations:  1.  Continue the beta blockers.  If the tube comes out he will need to receive them intravenously. 2.  Next week need to begin oral anticoagulation because of the history of atrial fibrillation and stroke.  Would favor using Eliquus if okay with surgery.  Dose per pharmacy.  When these are started will need to stop aspirin and Plavix.   Darden Palmer  MD [facility]  09/16/2015, 9:04 AM

## 2015-09-16 NOTE — Clinical Social Work Note (Signed)
Clinical Social Worker spoke with patient's grandson, [name] as he reported that he and pt's family are all agreeable to SNF placement at [facility] once patient is medically stable for discharge.  CSW will continued to follow pt and pt's family for continued support and to facilitate pt's discharge needs once medically stable.   Derenda Fennel, MSW, LCSWA [phone] 09/16/2015 11:53 AM

## 2015-09-16 NOTE — Progress Notes (Signed)
Physical Therapy Treatment Patient Details Name: [name] MRN: [id number] DOB: [birth year] Today's Date: 09/16/2015    History of Present Illness pt is an 81 y/o male admitted with servere stenosis in cervical and lumbar areas with assoc bil UE and LE's, s/p C23 and C45 fusions and decompressions AND one level lumbar lami and foraminectomy.  After sx noted pt with significant L hemiparesis with some inattension and R gaze preference.  MRI shows R large MCA and smaller PCA infarcts; ?L infarct    PT Comments    Pt continues to require extensive A for mobility and safety.  Pt with increased extension in trunk and LEs today requiring increased facilitation for flexion.  Continue to feel pt will need SNF level of care.    Follow Up Recommendations  SNF     Equipment Recommendations  None recommended by PT    Recommendations for Other Services       Precautions / Restrictions Precautions Precautions: Fall;Cervical;Back Precaution Booklet Issued: No Precaution Comments: pt not retaining education on back precautions.   Required Braces or Orthoses: Cervical Brace Cervical Brace: Hard collar;At all times Restrictions Weight Bearing Restrictions: No    Mobility  Bed Mobility Overal bed mobility: Needs Assistance Bed Mobility: Rolling;Sidelying to Sit;Sit to Sidelying Rolling: Max assist;+2 for physical assistance Sidelying to sit: Max assist;+2 for physical assistance     Sit to sidelying: Max assist;+2 for physical assistance General bed mobility comments: pt needs hand over hand facilitation for log roll technique.  pt with stron posterior pushing upon coming to sitting today.  Needed to return to side lying and then coming to sitting a second time with more flexed posture to minimize trunk extension.    Transfers                    Ambulation/Gait                 Stairs            Wheelchair Mobility    Modified Rankin (Stroke Patients  Only)       Balance Overall balance assessment: Needs assistance Sitting-balance support: Single extremity supported;Feet supported Sitting balance-Leahy Scale: Poor Sitting balance - Comments: pt pushes with R Ue to L and extends posteriorly in sitting.  worked on propping on R elbow and returning to midling with Mod x2.   Postural control: Posterior lean;Left lateral lean                          Cognition Arousal/Alertness:  (Drowsy, but easily aroused.) Behavior During Therapy: Flat affect Overall Cognitive Status: Impaired/Different from baseline Area of Impairment: Orientation;Attention;Memory;Following commands;Safety/judgement;Awareness;Problem solving Orientation Level: Disoriented to;Time Current Attention Level: Sustained Memory: Decreased recall of precautions;Decreased short-term memory Following Commands: Follows one step commands with increased time Safety/Judgement: Decreased awareness of safety;Decreased awareness of deficits Awareness: Intellectual Problem Solving: Slow processing;Decreased initiation;Difficulty sequencing;Requires verbal cues;Requires tactile cues General Comments: L neglect    Exercises      General Comments        Pertinent Vitals/Pain Pain Assessment: No/denies pain    Home Living                      Prior Function            PT Goals (current goals can now be found in the care plan section) Acute Rehab PT Goals Patient Stated Goal: to get better PT  Goal Formulation: With patient Time For Goal Achievement: 09/23/15 Potential to Achieve Goals: Good Progress towards PT goals: Progressing toward goals    Frequency  Min 3X/week    PT Plan Current plan remains appropriate    Co-evaluation             End of Session Equipment Utilized During Treatment: Cervical collar Activity Tolerance: Patient tolerated treatment well Patient left: in bed;with call bell/phone within reach;with bed alarm set      Time: 0827-0849 PT Time Calculation (min) (ACUTE ONLY): 22 min  Charges:  $Therapeutic Activity: 8-22 mins                    G CodesSunny Andrade, [place] [phone] 09/16/2015, 9:30 AM

## 2015-09-17 ENCOUNTER — Inpatient Hospital Stay (HOSPITAL_COMMUNITY): Payer: Medicare Other

## 2015-09-17 LAB — GLUCOSE, CAPILLARY
GLUCOSE-CAPILLARY: 124 mg/dL — AB (ref 65–99)
GLUCOSE-CAPILLARY: 142 mg/dL — AB (ref 65–99)
GLUCOSE-CAPILLARY: 174 mg/dL — AB (ref 65–99)
Glucose-Capillary: 123 mg/dL — ABNORMAL HIGH (ref 65–99)
Glucose-Capillary: 130 mg/dL — ABNORMAL HIGH (ref 65–99)
Glucose-Capillary: 117 mg/dL — ABNORMAL HIGH (ref 65–99)
Glucose-Capillary: 124 mg/dL — ABNORMAL HIGH (ref 65–99)

## 2015-09-17 MED ORDER — LOPERAMIDE HCL 1 MG/5ML PO LIQD
2.0000 mg | ORAL | Status: DC | PRN
  Administered 2015-09-17: 2 mg via NASOGASTRIC
  Filled 2015-09-17 (×2): qty 20

## 2015-09-17 NOTE — Progress Notes (Signed)
Subjective:  No complaints shortness of breath or chest pain today.  Progressing along following stroke and surgery.  Objective:  Vital Signs in the last 24 hours: BP 148/60 mmHg  Pulse 99  Temp(Src) 98.5 F (36.9 C) (Axillary)  Resp 20  Ht  (1.854 m)  Wt 88.2 kg (194 lb 7.1 oz)  BMI 25.66 kg/m2  SpO2 98%  Physical Exam: Elderly male currently in NAD  Lungs:  Clear  Cardiac:  Regular rhythm, normal S1 and S2, no S3 Extremities:  Left hemiparesis noted   Intake/Output from previous day: 09/29 0701 - 09/30 0700 In: -  Out: 2150 [Urine:2150] Weight Filed Weights   09/15/15 0500 09/16/15 0532 09/17/15 0523  Weight: 89 kg (196 lb 3.4 oz) 87.2 kg (192 lb 3.9 oz) 88.2 kg (194 lb 7.1 oz)    Lab Results: Basic Metabolic Panel:  Recent Labs  16/10/96 0726 09/16/15 0405  NA 135 136  K 3.4* 4.2  CL 102 104  CO2 27 25  GLUCOSE 129* 206*  BUN 14 16  CREATININE 0.74 0.70    CBC: No results for input(s): WBC, NEUTROABS, HGB, HCT, MCV, PLT in the last 72 hours. Telemetry: Normal sinus rhythm today.  Getting regular doses of beta blockers now.  Assessment/Plan:  1.  Paroxysmal atrial fibrillation recurrent  But now suppressed on beta blockers 2.  Hypertension 3.  Diabetes 4.  Peripheral vascular disease 5.  Recent right MCA stroke either watershed or embolic  Recommendations:  1.  Continue the beta blockers.  If the tube comes out he will need to receive them intravenously. 2.  Next week (10/5) need to begin oral anticoagulation because of the history of atrial fibrillation and stroke.  Would favor using Eliquus if okay with surgery.  Dose per pharmacy.  When these are started will need to stop aspirin and Plavix.   Darden Palmer  MD [facility]  09/17/2015, 8:49 AM

## 2015-09-17 NOTE — Progress Notes (Signed)
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  After sx noted pt with significant L hemiparesis with some inattension and R gaze preference.  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 Attention to L is improving slowly. L hemiparesis L neglect continues. Pt having visual hallucinations during session. Grandson present at beginning of session. Recommend rehab at SNF. Will continue to follow acutely.  Follow Up Recommendations  SNF;Supervision/Assistance - 24 hour    Equipment Recommendations  Other (comment) (TBA at SNF)    Recommendations for Other Services      Precautions / Restrictions Precautions Precautions: Fall;Cervical;Back Required Braces or Orthoses: Cervical Brace Cervical Brace: Hard collar;At all times       Mobility Bed Mobility   Bed Mobility: Rolling Rolling: Max assist (pt attempting to help with RUE)            Transfers                 General transfer comment: used maximove to move pt to chair    Balance     Sitting balance-Leahy Scale: Poor Sitting balance - Comments: poor midline orientation. Pt pushing L. Able to reorient self to midline in chair with vc to shift R shoulder to touch target on R                           ADL Overall ADL's : Needs assistance/impaired Eating/Feeding: NPO   Grooming: Moderate assistance;Wash/dry hands;Wash/dry face   Upper Body Bathing: Moderate assistance;Sitting Upper Body Bathing Details (indicate cue type and reason): uncoordinated movements with RUE; unaware of pressure he is using on L arm Lower Body Bathing:  Total assistance               Toileting- Clothing Manipulation and Hygiene: Total assistance Toileting - Clothing Manipulation Details (indicate cue type and reason): incontinent of urine/assisted CNA with changing/cleaning pt              Vision                 Additional Comments: L field cut   Perception     Praxis      Cognition   Behavior During Therapy: Flat affect Overall Cognitive Status: Impaired/Different from baseline Area of Impairment: Orientation;Attention;Memory;Following commands;Awareness;Safety/judgement;Problem solving Orientation Level: Disoriented to;Time Current Attention Level: Sustained Memory: Decreased recall of precautions;Decreased short-term memory  Following Commands: Follows one step commands with increased time Safety/Judgement: Decreased awareness of safety;Decreased awareness of deficits Awareness: Intellectual Problem Solving: Slow processing;Decreased initiation;Difficulty sequencing;Requires verbal cues General Comments: L neglect  Used maximove to transfer pt to recliner. Pt thinks he can get himself back to bed     Extremity/Trunk Assessment     LUE - minimal active adduction/synergy pattern; mod vc to locate LUE          Exercises Other Exercises Other Exercises: RUE A/[facility] with guiding for control Other Exercises: LUE PROM - tightness in shoulder flexion @ 90. Did not push due to cervical precautions   Shoulder Instructions       General Comments  Pertinent Vitals/ Pain       Pain Assessment: Faces Faces Pain Scale: No hurt  Home Living                                          Prior Functioning/Environment              Frequency Min 2X/week     Progress Toward Goals  OT Goals(current goals can now be found in the care plan section)  Progress towards OT goals: Progressing toward goals  Acute Rehab OT Goals Patient Stated Goal: to get better OT Goal Formulation:  With patient Time For Goal Achievement: 09/24/15 Potential to Achieve Goals: Good ADL Goals Pt Will Perform Grooming: sitting;with min assist Pt Will Perform Upper Body Bathing: with min assist;sitting Pt Will Transfer to Toilet: with min assist;with +2 assist;bedside commode;stand pivot transfer Additional ADL Goal #1: Maintain midline postural contro EOB x 10 min with minguard Additional ADL Goal #2: Locate 3/3 item L of midline with min vc  Plan Discharge plan needs to be updated;Frequency needs to be updated    Co-evaluation                 End of Session Equipment Utilized During Treatment: Cervical collar   Activity Tolerance Patient tolerated treatment well   Patient Left in chair;with call bell/phone within reach   Nurse Communication Mobility status;Need for lift equipment (maximove)        Time: 1610-9604 OT Time Calculation (min): 39 min  Charges: OT General Charges $OT Visit: 1 Procedure OT Treatments $Self Care/Home Management : 38-52 mins  WARD,HILLARY 09/17/2015, 4:04 PM   [facility], OTR/L  [phone] 09/17/2015

## 2015-09-17 NOTE — Progress Notes (Signed)
[facility] hospitalist consultation follow-up progress note  PATIENT DETAILS Name: [name] Age: 81 y.o. Sex: male Date of Birth: 04-19-1930 Admit Date: 09/06/2015 Admitting Physician Julio Sicks, MD [id number], Chancy Hurter, MD  Brief narrative:  [name] is a 81 y.o. male with history of HTN, DM, hypercholesterolemia, PAD, GERD, who presented to [name] on 9/19 for surgery to remedy progressive bilateral upper and lower extremity numbness from critical cervical and lumbar stenosis. Patient underwent anterior cervical decompression and lumbar decompression with laminectomy on 9/19. Postoperatively, patient remainrf on the ventilator in the intensive care unit. Unfortunately patient was noted to have dense left-sided hemiparesis, and also some concern for angioedema. Further workup demonstrated acute ischemia in the right MCA territory. Hospital course was further complicated by development of atrial fibrillation, presumed aspiration pneumonia and UTI. He unfortunately also developed significant dysphagia, and remains nothing by mouth with a panda tube in place. Patient was subsequently transferred to the medical surgical unit on 9/29, and [facility] was consulted to manage patient's ongoing medical issues.   Significant events during this hospital stay: 9/19 >>to OR for C2-C3 and C4-5 anterior anterior cervical discectomy with interbody fusion, anterior instrumentation at C2-3 and C4-5, L3-4 decompressive laminectomy with bilateral L3 and l4 foraminotomies. Returned to ICU on vent. 9/19>>Noted to have left hemiparesis-CT head -Suggestive of rt MCA infarct. 9/19 >> 9/21 OETT  9/25>> afib with rvr cards consult  Subjective: Awake, alert. Follows commands. Still with neglect of the left side. Panda tube in place.  Assessment/Plan: Principal Problem: Spinal stenosis in cervical/Lumbar region:s/p  C2-C3 and C4-5 anterior anterior cervical discectomy with  interbody fusion, anterior instrumentation at C2-3 and C4-5, L3-4 decompressive laminectomy with bilateral L3 and l4 foraminotomies.Cervical collar remains in place. Neurosurgery following  Active Problems: Acute Large right MCA CVA with scattered right PCA as well as punctate left MCA and right SCA : Continues to have dense left hemiparesis and hemi-neglect. Echo showed EF around 60-65%, carotid Doppler showed Bilateral ICA origin stenosis of up to 60% in the neck. Given atrial fibrillation, will need to start NOAC's-neurology recommending to wait 2 weeks before initiating anticoagulation as significant risk for hemorrhagic transition. Tentative NOAC start date 10/5. In the meantime-continue aspirin, Plavix and statin-once anticoagulation started aspirin and Plavix will need to be discontinued. Patient will need outpatient vascular surgery follow-up for carotid artery Doppler monitoring and CEA as indicated. Note LDL 41 (goal<70), A1c 6.1  Atrial fibrillation with RVR: Rate controlled with beta blocker. See above regarding anticoagulation. Cardiology following  Escherichia coli UTI: Afebrile, no leukocytosis. Continue Bactrim-stop date 10/4  Presumed aspiration pneumonia: Afebrile, no leukocytosis, continue Levaquin-stop date 10/1  Oral thrush:Continue diflucan-stop date 10/4   Dysphagia: Multifactorial-suspect predominantly secondary to cervical surgery/hardware position-with contributions critical illness, and possible CVA. Spoke with SLP 9/30-unfortunately continues to be at significant risk of aspiration-patient does not seem willing to risk starting Puree diet with nectar thick liquids-have asked Palliative Care to address issue of peg tube when they meet with patient/family either today or tomorrow.Continue PANDA tube for now, will likely require PEG tube placement if patient and family desire. Discussed with patient's son [name] over the phone-family aware that peg tube placement will not  reduce the risk of aspiration.  Hypokalemia: Repleted, will require periodic monitoring.  Dyslipidemia: Continue statin-LDL 41 (goal<70)  Type 2 diabetes: CBGs stable, continue suicide. Resume metformin on discharge. A1c 6.1  Hypertension: Controlled-continue metoprolol. Follow and adjust accordingly  Bilateral carotid artery stenosis: See above. Please show follow-up with vascular surgery on discharge  Tobacco abuse disorder: Will need ongoing counseling.  Disposition: Remain inpatient-SNF eventually-suspect next week. Issue of comfort feeds vs peg tube will need to addressed prior to discharge.  Antimicrobial agents  See below  Anti-infectives    Start     Dose/Rate Route Frequency Ordered Stop   09/15/15 1015  fluconazole (DIFLUCAN) tablet 100 mg  Status:  Discontinued     100 mg Oral Daily 09/15/15 1007 09/15/15 1008   09/15/15 1015  fluconazole (DIFLUCAN) tablet 100 mg     100 mg Per Tube Daily 09/15/15 1008     09/15/15 1000  levofloxacin (LEVAQUIN) IVPB 750 mg     750 mg 100 mL/hr over 90 Minutes Intravenous Every 24 hours 09/14/15 1157     09/14/15 1300  sulfamethoxazole-trimethoprim (BACTRIM,SEPTRA) 200-40 MG/5ML suspension 20 mL     20 mL Per Tube Every 12 hours 09/14/15 1201     09/12/15 1000  levofloxacin (LEVAQUIN) IVPB 500 mg  Status:  Discontinued     500 mg 100 mL/hr over 60 Minutes Intravenous Every 24 hours 09/12/15 0954 09/14/15 1157   09/10/15 1800  vancomycin (VANCOCIN) 1,250 mg in sodium chloride 0.9 % 250 mL IVPB  Status:  Discontinued     1,250 mg 166.7 mL/hr over 90 Minutes Intravenous Every 24 hours 09/09/15 2024 09/11/15 1122   09/06/15 1830  vancomycin (VANCOCIN) IVPB 1000 mg/200 mL premix  Status:  Discontinued     1,000 mg 200 mL/hr over 60 Minutes Intravenous Every 12 hours 09/06/15 1534 09/09/15 2024   09/06/15 0715  bacitracin 50,000 Units in sodium chloride irrigation 0.9 % 500 mL irrigation  Status:  Discontinued       As needed 09/06/15  0900 09/06/15 1300   09/06/15 0644  vancomycin (VANCOCIN) IVPB 1000 mg/200 mL premix     1,000 mg 200 mL/hr over 60 Minutes Intravenous On call to O.R. 09/06/15 1610 09/06/15 9604      DVT Prophylaxis: Prophylactic Heparin   Code Status: Full code   Family Communication None at bedside  Procedures: 9/19 >>to OR for C2-C3 and C4-5 anterior anterior cervical discectomy with interbody fusion, anterior instrumentation at C2-3 and C4-5, L3-4 decompressive laminectomy with bilateral L3 and l4 foraminotomies. 9/19 >> 9/21 OETT   CONSULTS:  cardiology, pulmonary/intensive care and Palliative  Time spent 30 minutes-Greater than 50% of this time was spent in counseling, explanation of diagnosis, planning of further management, and coordination of care.  MEDICATIONS: Scheduled Meds: . antiseptic oral rinse  7 mL Mouth Rinse q12n4p  . aspirin EC  81 mg Oral Daily  . chlorhexidine  15 mL Mouth Rinse BID  . clopidogrel  75 mg Oral Daily  . feeding supplement (PRO-STAT SUGAR FREE 64)  30 mL Per Tube BID  . fluconazole  100 mg Per Tube Daily  . folic acid  1 mg Oral Daily  . heparin subcutaneous  5,000 Units Subcutaneous 3 times per day  . insulin aspart  0-15 Units Subcutaneous 6 times per day  . levofloxacin (LEVAQUIN) IV  750 mg Intravenous Q24H  . metoprolol tartrate  25 mg Oral BID  . pantoprazole (PROTONIX) IV  40 mg Intravenous QHS  . potassium chloride  40 mEq Per Tube Once  . rosuvastatin  10 mg Oral Daily  . sulfamethoxazole-trimethoprim  20 mL Per Tube Q12H   Continuous Infusions: . feeding  supplement (JEVITY 1.2 CAL) 1,000 mL (09/14/15 0417)  . phenylephrine (NEO-SYNEPHRINE) Adult infusion Stopped (09/06/15 2115)  . sodium chloride 0.9 % 1,000 mL with potassium chloride 40 mEq infusion 75 mL/hr at 09/16/15 2241   PRN Meds:.acetaminophen **OR** acetaminophen, cyclobenzaprine, iohexol, iohexol, loperamide, menthol-cetylpyridinium **OR** phenol, metoprolol, midazolam,  midazolam, ondansetron (ZOFRAN) IV    PHYSICAL EXAM: Vital signs in last 24 hours: Filed Vitals:   09/16/15 2144 09/17/15 0125 09/17/15 0523 09/17/15 0959  BP: 141/64 139/54 148/60 172/85  Pulse: 95 70 99 97  Temp: 98.4 F (36.9 C) 98.1 F (36.7 C) 98.5 F (36.9 C) 98.6 F (37 C)  TempSrc: Oral Axillary Axillary Oral  Resp: 20 20 20 17   Height:      Weight:   88.2 kg (194 lb 7.1 oz)   SpO2: 95% 95% 98% 95%    Weight change: 1 kg (2 lb 3.3 oz) Filed Weights   09/15/15 0500 09/16/15 0532 09/17/15 0523  Weight: 89 kg (196 lb 3.4 oz) 87.2 kg (192 lb 3.9 oz) 88.2 kg (194 lb 7.1 oz)   Body mass index is 25.66 kg/(m^2).   Gen Exam: Awake and alert-somewhat dysarthric. Panda tube in place, cervical collar in place Chest: B/L Clear anteriorly CVS: S1 S2 Regular Abdomen: soft, BS +, non tender, non distended.  Extremities: no edema, lower extremities warm to touch. Neurologic: Dense left hemiparesis Skin: No Rash.   Wounds: N/A.    Intake/Output from previous day:  Intake/Output Summary (Last 24 hours) at 09/17/15 1237 Last data filed at 09/17/15 0457  Gross per 24 hour  Intake      0 ml  Output   1450 ml  Net  -1450 ml     LAB RESULTS: CBC  Recent Labs Lab 09/11/15 0220 09/11/15 2315 09/13/15 0230 09/14/15 0532  WBC 9.1 10.0 8.1 7.3  HGB 11.9* 11.9* 11.0* 10.6*  HCT 34.7* 34.3* 32.7* 31.8*  PLT 91* 95* 106* 130*  MCV 87.0 87.7 88.4 89.6  MCH 29.8 30.4 29.7 29.9  MCHC 34.3 34.7 33.6 33.3  RDW 14.6 14.9 15.0 15.0  LYMPHSABS  --  0.6*  --   --   MONOABS  --  1.1*  --   --   EOSABS  --  0.0  --   --   BASOSABS  --  0.0  --   --     Chemistries   Recent Labs Lab 09/11/15 0220 09/11/15 1959 09/13/15 0230 09/14/15 0532 09/15/15 0726 09/16/15 0405  NA 136 139 142 137 135 136  K 3.3* 3.5 3.4* 3.3* 3.4* 4.2  CL 102 106 106 104 102 104  CO2 26 27 28 27 27 25   GLUCOSE 162* 141* 133* 154* 129* 206*  BUN 17 16 14 19 14 16   CREATININE 0.73 0.76 0.69  0.74 0.74 0.70  CALCIUM 7.8* 8.2* 8.3* 7.8* 7.9* 8.0*  MG 1.6*  --   --  1.6* 1.6*  --     CBG:  Recent Labs Lab 09/16/15 1558 09/17/15 0025 09/17/15 0414 09/17/15 0812 09/17/15 1142  GLUCAP 114* 174* 124* 123* 130*    GFR Estimated Creatinine Clearance: 79.1 mL/min (by C-G formula based on Cr of 0.7).  Coagulation profile No results for input(s): INR, PROTIME in the last 168 hours.  Cardiac Enzymes  Recent Labs Lab 09/11/15 1959  CKMB 2.0  TROPONINI <0.03    Invalid input(s): POCBNP No results for input(s): DDIMER in the last 72 hours. No results for input(s): HGBA1C in  the last 72 hours. No results for input(s): CHOL, HDL, LDLCALC, TRIG, CHOLHDL, LDLDIRECT in the last 72 hours. No results for input(s): TSH, T4TOTAL, T3FREE, THYROIDAB in the last 72 hours.  Invalid input(s): FREET3 No results for input(s): VITAMINB12, FOLATE, FERRITIN, TIBC, IRON, RETICCTPCT in the last 72 hours. No results for input(s): LIPASE, AMYLASE in the last 72 hours.  Urine Studies No results for input(s): UHGB, CRYS in the last 72 hours.  Invalid input(s): UACOL, UAPR, USPG, UPH, UTP, UGL, UKET, UBIL, UNIT, UROB, ULEU, UEPI, UWBC, URBC, UBAC, CAST, UCOM, BILUA  MICROBIOLOGY: Recent Results (from the past 240 hour(s))  Culture, blood (routine x 2)     Status: None   Collection Time: 09/11/15 10:59 PM  Result Value Ref Range Status   Specimen Description BLOOD LEFT HAND  Final   Special Requests BOTTLES DRAWN AEROBIC AND ANAEROBIC 5CC  Final   Culture NO GROWTH 5 DAYS  Final   Report Status 09/16/2015 FINAL  Final  Culture, blood (routine x 2)     Status: None   Collection Time: 09/11/15 11:09 PM  Result Value Ref Range Status   Specimen Description BLOOD RIGHT HAND  Final   Special Requests IN PEDIATRIC BOTTLE 1CC  Final   Culture NO GROWTH 5 DAYS  Final   Report Status 09/16/2015 FINAL  Final  Culture, Urine     Status: None   Collection Time: 09/11/15 11:54 PM  Result Value  Ref Range Status   Specimen Description URINE, RANDOM  Final   Special Requests NONE  Final   Culture 50,000 COLONIES/mL YEAST  Final   Report Status 09/13/2015 FINAL  Final  Culture, Urine     Status: None   Collection Time: 09/12/15 12:45 PM  Result Value Ref Range Status   Specimen Description URINE, CLEAN CATCH  Final   Special Requests Normal  Final   Culture >=100,000 COLONIES/mL ESCHERICHIA COLI  Final   Report Status 09/14/2015 FINAL  Final   Organism ID, Bacteria ESCHERICHIA COLI  Final      Susceptibility   Escherichia coli - MIC*    AMPICILLIN >=32 RESISTANT Resistant     CEFAZOLIN <=4 SENSITIVE Sensitive     CEFTRIAXONE <=1 SENSITIVE Sensitive     CIPROFLOXACIN >=4 RESISTANT Resistant     GENTAMICIN <=1 SENSITIVE Sensitive     IMIPENEM <=0.25 SENSITIVE Sensitive     NITROFURANTOIN <=16 SENSITIVE Sensitive     TRIMETH/SULFA <=20 SENSITIVE Sensitive     AMPICILLIN/SULBACTAM >=32 RESISTANT Resistant     PIP/TAZO <=4 SENSITIVE Sensitive     * >=100,000 COLONIES/mL ESCHERICHIA COLI    RADIOLOGY STUDIES/RESULTS: Dg Cervical Spine 1 View  09/06/2015   CLINICAL DATA:  ACDF C2-3 and C4-5  EXAM: DG C-ARM 61-120 MIN; DG CERVICAL SPINE - 1 VIEW  COMPARISON:  None.  FLUOROSCOPY TIME:  10 seconds  One image  FINDINGS: Single lateral intraoperative fluoroscopic image is provided of the cervical spine. Anterior cervical fusion with interbody spacer device at C2-3 and C4-5.  IMPRESSION: ACDF C2-3 and C4-5.   Electronically Signed   By: Elige Ko   On: 09/06/2015 12:43   Dg Abd 1 View  09/15/2015   CLINICAL DATA:  Feeding tube placement  EXAM: ABDOMEN - 1 VIEW  COMPARISON:  09/13/2015  TECHNIQUE: Twelve French feeding tube placed under fluoroscopy by Delice Bison Dingus RT-R.  20 mL of Omnipaque 300 was utilized to confirm placement.  FLUOROSCOPY TIME:  5 minutes 12 seconds  FINDINGS: Contrast  opacifies second third portions the duodenum as well as RIGHT proximal jejunal loop.  Feeding tube is  placed with the tip at/beyond ligament of Treitz.  IMPRESSION: Placement of feeding tube with tip at/beyond ligament of Treitz.   Electronically Signed   By: Ulyses Southward M.D.   On: 09/15/2015 14:49   Dg Abd 1 View  09/10/2015   CLINICAL DATA:  Nasoenteric feeding tube placement by RT  EXAM: ABDOMEN - 1 VIEW  COMPARISON:  06/15/2006  FINDINGS: Feeding tube extends to the ligament of Treitz, confirmed with contrast injection.  IMPRESSION: Feeding tube placement to ligament of Treitz.   Electronically Signed   By: Corlis Leak M.D.   On: 09/10/2015 13:26   Ct Head Wo Contrast  09/06/2015   CLINICAL DATA:  Left-sided weakness following cervical surgery  EXAM: CT HEAD WITHOUT CONTRAST  TECHNIQUE: Contiguous axial images were obtained from the base of the skull through the vertex without intravenous contrast.  COMPARISON:  06/27/2013  FINDINGS: The bony calvarium is intact. No gross soft tissue abnormality is seen. Mild atrophic changes are noted. There is some generalized decreased attenuation identified in the distribution of the right middle cerebral artery suggestive of a early ischemic event. MRI may be helpful further evaluation. No other focal areas of ischemia or hemorrhage are noted.  IMPRESSION: Chronic atrophic changes.  Generalized decreased attenuation in the distribution of the right middle cerebral artery suggestive of early ischemia. MRI may be helpful for further evaluation.  These results were called by telephone at the time of interpretation on 09/06/2015 at 5:34 pm to [facility], the pts nurse, who verbally acknowledged these results.   Electronically Signed   By: Alcide Clever M.D.   On: 09/06/2015 17:37   Mr Shirlee Latch Wo Contrast  09/08/2015   CLINICAL DATA:  81 year old male with left side weakness following cervical spine and lumbar surgery. Initial encounter.  EXAM: MRI HEAD WITHOUT CONTRAST AND WITH CONTRAST  MRA HEAD WITHOUT CONTRAST  MRA NECK WITHOUT AND WITH CONTRAST  TECHNIQUE: Multiplanar,  multiecho pulse sequences of the brain and surrounding structures were obtained without intravenous contrast. Angiographic images of the Circle of Willis were obtained using MRA technique without intravenous contrast. Angiographic images of the neck were obtained using MRA technique without and with intravenous contrast. Carotid stenosis measurements (when applicable) are obtained utilizing NASCET criteria, using the distal internal carotid diameter as the denominator.  CONTRAST:  20mL MULTIHANCE GADOBENATE DIMEGLUMINE 529 MG/ML IV SOLN  COMPARISON:  Head CT without contrast 09/06/2015. Preoperative Cervical spine MRI 08/12/2015  FINDINGS: MRI HEAD FINDINGS  Confluent restricted diffusion throughout most of the right hemisphere. Near complete involvement of the right MCA territory. Right MCA/PCA watershed involvement extending to the lateral occipital pole. Scattered involvement elsewhere in the right PCA territory. Some of the right temporal lobe also is spared.  Scattered small areas of restricted diffusion also in the left MCA territory, an the right superior cerebellar artery territory.  Major intracranial vascular flow voids are preserved, see MRA findings below.  Confluent cytotoxic edema in the right hemisphere. Areas of petechial hemorrhage in the anterior and posterior right MCA territories. Mass effect on the right lateral ventricle. Mild leftward midline shift of 3 mm. No ventriculomegaly. Basilar cisterns are patent. No extra-axial or intraventricular hemorrhage. Following contrast, no significant post ischemic enhancement.  Minimal other nonspecific cerebral white matter T2 and FLAIR hyperintensity. There is a small chronic lacunar infarct in the right PICA territory (series 6, image 5).  Visible internal auditory structures appear normal. Trace mastoid fluid. Mild paranasal sinus mucosal thickening. The patient is intubated. Trace fluid in the oral cavity. Postoperative changes to the globes. Negative  scalp soft tissues.  Hardware susceptibility artifact in the cervical spine. Chronic spinal Stenosis with mass effect on the spinal cord at C2-C3 (series 3, image 13).  MRA NECK FINDINGS  Precontrast time-of-flight imaging. A antegrade flow in both carotid and vertebral arteries in the neck and to the skullbase.  Post-contrast neck MRA images. Three vessel arch configuration. No great vessel origin stenosis.  Negative right CCA proximal to the right carotid bifurcation. At the right ICA origin there is irregularity and stenosis of up to 60 % with respect to the distal vessel (series 1302, image 84 and series 16109, image 5). The cervical right ICA is patent and otherwise appears negative to the skullbase.  Negative left CCA. Irregularity and stenosis at the left ICA origin also measuring up to 60 % with respect to the distal vessel and best seen on series 1302, image 71. The left ICA remains patent and is otherwise negative to the skullbase.  No proximal subclavian artery stenosis. Normal left vertebral artery origin. Mild if any stenosis at the right vertebral artery origin. The left vertebral artery is mildly dominant throughout.  MRA HEAD FINDINGS  Antegrade flow in the posterior circulation. Dominant distal left vertebral artery. Normal right PICA origin. Dominant appearing left AICA with a patent origin. Patent vertebrobasilar junction. No basilar stenosis. Normal SCA and PCA origins, fetal type on the right. Both posterior communicating arteries are present. Bilateral PCA branches are within normal limits.  Antegrade flow in both ICA siphons. No siphon stenosis. Ophthalmic and posterior communicating artery origins are within normal limits. Small probable atherosclerotic pseudo lesion of the left ICA siphon in the superior hypophyseal region (series 505, image 11). Patent carotid termini. Normal MCA and ACA origins.  Anterior communicating artery and visualized ACA branches are within normal limits. Left MCA  M1 segment and bifurcation are within normal limits. No left MCA branch occlusion identified. Right MCA M1 segment and bifurcation are within normal limits. No major right MCA branch occlusion identified.  IMPRESSION: 1. 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 2. Scattered small left MCA and occasional right SCA acute infarcts. 3. Bilateral ICA origin stenosis of up to 60% in the neck. No emergent large vessel occlusion. No intracranial stenosis or MCA branch occlusion identified. 4. Chronic spinal stenosis with spinal cord mass effect at C2-C3. New upper cervical fusion hardware.   Electronically Signed   By: Odessa Fleming M.D.   On: 09/08/2015 10:20   Mr Angiogram Neck W Wo Contrast  09/08/2015   CLINICAL DATA:  81 year old male with left side weakness following cervical spine and lumbar surgery. Initial encounter.  EXAM: MRI HEAD WITHOUT CONTRAST AND WITH CONTRAST  MRA HEAD WITHOUT CONTRAST  MRA NECK WITHOUT AND WITH CONTRAST  TECHNIQUE: Multiplanar, multiecho pulse sequences of the brain and surrounding structures were obtained without intravenous contrast. Angiographic images of the Circle of Willis were obtained using MRA technique without intravenous contrast. Angiographic images of the neck were obtained using MRA technique without and with intravenous contrast. Carotid stenosis measurements (when applicable) are obtained utilizing NASCET criteria, using the distal internal carotid diameter as the denominator.  CONTRAST:  20mL MULTIHANCE GADOBENATE DIMEGLUMINE 529 MG/ML IV SOLN  COMPARISON:  Head  CT without contrast 09/06/2015. Preoperative Cervical spine MRI 08/12/2015  FINDINGS: MRI HEAD FINDINGS  Confluent restricted diffusion throughout most of the right hemisphere. Near complete involvement of the right MCA territory. Right MCA/PCA watershed involvement extending to the lateral  occipital pole. Scattered involvement elsewhere in the right PCA territory. Some of the right temporal lobe also is spared.  Scattered small areas of restricted diffusion also in the left MCA territory, an the right superior cerebellar artery territory.  Major intracranial vascular flow voids are preserved, see MRA findings below.  Confluent cytotoxic edema in the right hemisphere. Areas of petechial hemorrhage in the anterior and posterior right MCA territories. Mass effect on the right lateral ventricle. Mild leftward midline shift of 3 mm. No ventriculomegaly. Basilar cisterns are patent. No extra-axial or intraventricular hemorrhage. Following contrast, no significant post ischemic enhancement.  Minimal other nonspecific cerebral white matter T2 and FLAIR hyperintensity. There is a small chronic lacunar infarct in the right PICA territory (series 6, image 5).  Visible internal auditory structures appear normal. Trace mastoid fluid. Mild paranasal sinus mucosal thickening. The patient is intubated. Trace fluid in the oral cavity. Postoperative changes to the globes. Negative scalp soft tissues.  Hardware susceptibility artifact in the cervical spine. Chronic spinal Stenosis with mass effect on the spinal cord at C2-C3 (series 3, image 13).  MRA NECK FINDINGS  Precontrast time-of-flight imaging. A antegrade flow in both carotid and vertebral arteries in the neck and to the skullbase.  Post-contrast neck MRA images. Three vessel arch configuration. No great vessel origin stenosis.  Negative right CCA proximal to the right carotid bifurcation. At the right ICA origin there is irregularity and stenosis of up to 60 % with respect to the distal vessel (series 1302, image 84 and series 91478, image 5). The cervical right ICA is patent and otherwise appears negative to the skullbase.  Negative left CCA. Irregularity and stenosis at the left ICA origin also measuring up to 60 % with respect to the distal vessel and best  seen on series 1302, image 71. The left ICA remains patent and is otherwise negative to the skullbase.  No proximal subclavian artery stenosis. Normal left vertebral artery origin. Mild if any stenosis at the right vertebral artery origin. The left vertebral artery is mildly dominant throughout.  MRA HEAD FINDINGS  Antegrade flow in the posterior circulation. Dominant distal left vertebral artery. Normal right PICA origin. Dominant appearing left AICA with a patent origin. Patent vertebrobasilar junction. No basilar stenosis. Normal SCA and PCA origins, fetal type on the right. Both posterior communicating arteries are present. Bilateral PCA branches are within normal limits.  Antegrade flow in both ICA siphons. No siphon stenosis. Ophthalmic and posterior communicating artery origins are within normal limits. Small probable atherosclerotic pseudo lesion of the left ICA siphon in the superior hypophyseal region (series 505, image 11). Patent carotid termini. Normal MCA and ACA origins.  Anterior communicating artery and visualized ACA branches are within normal limits. Left MCA M1 segment and bifurcation are within normal limits. No left MCA branch occlusion identified. Right MCA M1 segment and bifurcation are within normal limits. No major right MCA branch occlusion identified.  IMPRESSION: 1. 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 2. Scattered small left MCA and occasional right SCA acute infarcts. 3. Bilateral ICA origin stenosis of up to 60% in the neck. No emergent large vessel occlusion.  No intracranial stenosis or MCA branch occlusion identified. 4. Chronic spinal stenosis with spinal cord mass effect at C2-C3. New upper cervical fusion hardware.   Electronically Signed   By: Odessa Fleming M.D.   On: 09/08/2015 10:20   Mr Laqueta Jean ZO Contrast  09/08/2015   CLINICAL DATA:  81 year old  male with left side weakness following cervical spine and lumbar surgery. Initial encounter.  EXAM: MRI HEAD WITHOUT CONTRAST AND WITH CONTRAST  MRA HEAD WITHOUT CONTRAST  MRA NECK WITHOUT AND WITH CONTRAST  TECHNIQUE: Multiplanar, multiecho pulse sequences of the brain and surrounding structures were obtained without intravenous contrast. Angiographic images of the Circle of Willis were obtained using MRA technique without intravenous contrast. Angiographic images of the neck were obtained using MRA technique without and with intravenous contrast. Carotid stenosis measurements (when applicable) are obtained utilizing NASCET criteria, using the distal internal carotid diameter as the denominator.  CONTRAST:  20mL MULTIHANCE GADOBENATE DIMEGLUMINE 529 MG/ML IV SOLN  COMPARISON:  Head CT without contrast 09/06/2015. Preoperative Cervical spine MRI 08/12/2015  FINDINGS: MRI HEAD FINDINGS  Confluent restricted diffusion throughout most of the right hemisphere. Near complete involvement of the right MCA territory. Right MCA/PCA watershed involvement extending to the lateral occipital pole. Scattered involvement elsewhere in the right PCA territory. Some of the right temporal lobe also is spared.  Scattered small areas of restricted diffusion also in the left MCA territory, an the right superior cerebellar artery territory.  Major intracranial vascular flow voids are preserved, see MRA findings below.  Confluent cytotoxic edema in the right hemisphere. Areas of petechial hemorrhage in the anterior and posterior right MCA territories. Mass effect on the right lateral ventricle. Mild leftward midline shift of 3 mm. No ventriculomegaly. Basilar cisterns are patent. No extra-axial or intraventricular hemorrhage. Following contrast, no significant post ischemic enhancement.  Minimal other nonspecific cerebral white matter T2 and FLAIR hyperintensity. There is a small chronic lacunar infarct in the right PICA territory (series  6, image 5).  Visible internal auditory structures appear normal. Trace mastoid fluid. Mild paranasal sinus mucosal thickening. The patient is intubated. Trace fluid in the oral cavity. Postoperative changes to the globes. Negative scalp soft tissues.  Hardware susceptibility artifact in the cervical spine. Chronic spinal Stenosis with mass effect on the spinal cord at C2-C3 (series 3, image 13).  MRA NECK FINDINGS  Precontrast time-of-flight imaging. A antegrade flow in both carotid and vertebral arteries in the neck and to the skullbase.  Post-contrast neck MRA images. Three vessel arch configuration. No great vessel origin stenosis.  Negative right CCA proximal to the right carotid bifurcation. At the right ICA origin there is irregularity and stenosis of up to 60 % with respect to the distal vessel (series 1302, image 84 and series 10960, image 5). The cervical right ICA is patent and otherwise appears negative to the skullbase.  Negative left CCA. Irregularity and stenosis at the left ICA origin also measuring up to 60 % with respect to the distal vessel and best seen on series 1302, image 71. The left ICA remains patent and is otherwise negative to the skullbase.  No proximal subclavian artery stenosis. Normal left vertebral artery origin. Mild if any stenosis at the right vertebral artery origin. The left vertebral artery is mildly dominant throughout.  MRA HEAD FINDINGS  Antegrade flow in the posterior circulation. Dominant distal left vertebral artery. Normal right PICA origin. Dominant appearing left AICA with a patent origin. Patent vertebrobasilar junction. No basilar stenosis. Normal SCA  and PCA origins, fetal type on the right. Both posterior communicating arteries are present. Bilateral PCA branches are within normal limits.  Antegrade flow in both ICA siphons. No siphon stenosis. Ophthalmic and posterior communicating artery origins are within normal limits. Small probable atherosclerotic pseudo  lesion of the left ICA siphon in the superior hypophyseal region (series 505, image 11). Patent carotid termini. Normal MCA and ACA origins.  Anterior communicating artery and visualized ACA branches are within normal limits. Left MCA M1 segment and bifurcation are within normal limits. No left MCA branch occlusion identified. Right MCA M1 segment and bifurcation are within normal limits. No major right MCA branch occlusion identified.  IMPRESSION: 1. 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 2. Scattered small left MCA and occasional right SCA acute infarcts. 3. Bilateral ICA origin stenosis of up to 60% in the neck. No emergent large vessel occlusion. No intracranial stenosis or MCA branch occlusion identified. 4. Chronic spinal stenosis with spinal cord mass effect at C2-C3. New upper cervical fusion hardware.   Electronically Signed   By: Odessa Fleming M.D.   On: 09/08/2015 10:20   Dg Lumbar Spine 1 View  09/06/2015   CLINICAL DATA:  Laminectomy.  EXAM: LUMBAR SPINE - 1 VIEW  COMPARISON:  None.  FINDINGS: Metallic patch that lumbar vertebra are numbered with the lowest lumbar shaped vertebra as L5. This may be a transitional vertebra. 11 mm anterolisthesis of L4 on L5 noted. Metallic markers noted posteriorly at the L4-L5 level  IMPRESSION: Metallic marker noted posteriorly at the L4-L5 level.   Electronically Signed   By: Maisie Fus  Register   On: 09/06/2015 13:28   Dg Chest Port 1 View  09/13/2015   CLINICAL DATA:  81 year old male with respiratory failure. Subsequent encounter.  EXAM: PORTABLE CHEST 1 VIEW  COMPARISON:  09/11/2015.  FINDINGS: Poor inspiration with consolidation lung bases greater on left. This may represent atelectasis or infiltrate and without significant change.  Central pulmonary vascular prominence.  Prominent osteophyte mid thoracic vertebra, better evaluated on  08/12/2015 MR.  Mild cardiomegaly.  No gross pneumothorax.  IMPRESSION: Poor inspiration with consolidation lung bases greater on left. This may represent atelectasis or infiltrate and without significant change.  Central pulmonary vascular prominence.  Prominent osteophyte mid thoracic vertebra, better evaluated on 08/12/2015 MR.  Mild cardiomegaly.   Electronically Signed   By: Lacy Duverney M.D.   On: 09/13/2015 07:59   Dg Chest Port 1 View  09/12/2015   CLINICAL DATA:  Fever today.  EXAM: PORTABLE CHEST 1 VIEW  COMPARISON:  09/09/2015.  FINDINGS: Normal sized heart. Decreased patchy and linear density at the right lung base. Stable patchy density at the left lung base. Thoracic spine degenerative changes.  IMPRESSION: 1. Persistent patchy density at the left lung base, suspicious for pneumonia. 2. Decreased atelectasis at the right lung base with residual patchy pneumonia or atelectasis   Electronically Signed   By: Beckie Salts M.D.   On: 09/12/2015 03:04   Dg Chest Port 1 View  09/09/2015   CLINICAL DATA:  Acute respiratory failure  EXAM: PORTABLE CHEST - 1 VIEW  COMPARISON:  09/08/2015  FINDINGS: Low lung volumes are present, causing crowding of the pulmonary vasculature. Airspace opacities at the left lung base and left infrahilar region, slightly worsened. Minimal right basilar airspace opacity medially.  Thoracic spondylosis. Borderline enlargement of the cardiopericardial silhouette.  Endotracheal and nasogastric tubes have been removed.  IMPRESSION: 1. Bibasilar airspace opacities, left greater than right, mildly worsened. 2. Borderline enlargement of the cardiopericardial silhouette.   Electronically Signed   By: Gaylyn Rong M.D.   On: 09/09/2015 08:08   Dg Chest Port 1 View  09/08/2015   CLINICAL DATA:  Acute respiratory failure portable chest x-ray of 09/07/2015  EXAM: PORTABLE CHEST - 1 VIEW  COMPARISON:  None.  FINDINGS: There is persistent basilar atelectasis  left-greater-than-right. A small left effusion cannot be excluded. The right lung is grossly clear. Endotracheal tube and NG tube remain, unchanged in position.  IMPRESSION: Persistent left basilar atelectasis.  Stable mild cardiomegaly.   Electronically Signed   By: Dwyane Dee M.D.   On: 09/08/2015 08:39   Portable Chest Xray  09/07/2015   CLINICAL DATA:  Respiratory failure, ventilatory support  EXAM: PORTABLE CHEST - 1 VIEW  COMPARISON:  09/06/2015  FINDINGS: Endotracheal tube 7.8 cm above the carina. NG tube enters the stomach with the tip not visualized. Increased left basilar opacity/atelectasis. Right lung remains clear. No enlarging effusion or pneumothorax. Degenerative changes of the spine. Atherosclerosis of the aorta.  IMPRESSION: Stable support apparatus.  Increased left basilar atelectasis   Electronically Signed   By: Judie Petit.  Shick M.D.   On: 09/07/2015 08:20   Dg Chest Port 1 View  09/06/2015   CLINICAL DATA:  Intubation orogastric tube placement  EXAM: PORTABLE CHEST - 1 VIEW  COMPARISON:  08/01/2010  FINDINGS: Endotracheal tube is 3.9 cm above the carina. Orogastric tube crosses the gastroesophageal junction with the. Mild left base opacity suggesting small effusion. Heart size and vascular pattern normal. Right lung clear. Mild atelectasis left lung base.  IMPRESSION: Lines and tubes as described above.   Electronically Signed   By: Esperanza Heir M.D.   On: 09/06/2015 16:45   Dg Abd Portable 1v  09/13/2015   CLINICAL DATA:  Nasogastric tube placement.  EXAM: PORTABLE ABDOMEN - 1 VIEW  COMPARISON:  09/10/2015 and chest x-ray today.  FINDINGS: There has been placement of a enteric tube with tip over the stomach in the left upper quadrant. Bowel gas pattern is nonobstructive. Persistent left base opacification. Moderate degenerative change of the spine.  IMPRESSION: Nonobstructive bowel gas pattern.  Enteric feeding tube with tip over the stomach in the left upper quadrant.   Electronically  Signed   By: Elberta Fortis M.D.   On: 09/13/2015 09:58   Dg Vangie Bicker G Tube Plc W/fl-no Rad  09/15/2015   CLINICAL DATA:    NASO G TUBE PLACEMENT WITH FLUORO  Fluoroscopy was utilized by the requesting physician.  No radiographic  interpretation.    Dg Naso G Tube Plc W/fl-no Rad  09/10/2015   CLINICAL DATA:    NASO G TUBE PLACEMENT WITH FLUORO  Fluoroscopy was utilized by the requesting physician.  No radiographic  interpretation.    Dg C-arm 1-60 Min  09/06/2015   CLINICAL DATA:  ACDF C2-3 and C4-5  EXAM: DG C-ARM 61-120 MIN; DG CERVICAL SPINE - 1 VIEW  COMPARISON:  None.  FLUOROSCOPY TIME:  10 seconds  One image  FINDINGS: Single lateral intraoperative fluoroscopic image is provided of the cervical spine. Anterior cervical fusion with interbody spacer device at C2-3 and C4-5.  IMPRESSION: ACDF C2-3 and C4-5.   Electronically Signed   By: Elige Ko   On: 09/06/2015 12:43    Jeoffrey Massed, MD  [facility] Pager:[phone]  If 7PM-7AM, please contact night-coverage  [email or web address] Password [email or web address] 09/17/2015, 12:37 PM   LOS: 11 days

## 2015-09-17 NOTE — Care Management Important Message (Signed)
Important Message  Patient Details  Name: [name] MRN: [id number] Date of Birth: 1930/11/18   Medicare Important Message Given:  Yes-fourth notification given    [name] 09/17/2015, 3:11 PM

## 2015-09-17 NOTE — Progress Notes (Signed)
Speech Pathology MBSS complete. Full report located under chart review in imaging section. Bonnie DeBlois, MA CCC-SLP [phone]  

## 2015-09-17 NOTE — Progress Notes (Signed)
No change in status. Patient denies pain. Resting comfortably. No chest pain or shortness of breath.  Afebrile. Vital stable. Heart rate controlled. Good urine output. Bowels working. Awake and aware. Dense left hemiparesis unchanged. Wounds clean and dry.  Continue supportive efforts. Possible discharge to [facility] unit early next week.

## 2015-09-18 DIAGNOSIS — Z7189 Other specified counseling: Secondary | ICD-10-CM

## 2015-09-18 DIAGNOSIS — Z515 Encounter for palliative care: Secondary | ICD-10-CM

## 2015-09-18 DIAGNOSIS — F17200 Nicotine dependence, unspecified, uncomplicated: Secondary | ICD-10-CM

## 2015-09-18 DIAGNOSIS — I63511 Cerebral infarction due to unspecified occlusion or stenosis of right middle cerebral artery: Secondary | ICD-10-CM

## 2015-09-18 DIAGNOSIS — B9620 Unspecified Escherichia coli [E. coli] as the cause of diseases classified elsewhere: Secondary | ICD-10-CM

## 2015-09-18 LAB — GLUCOSE, CAPILLARY
GLUCOSE-CAPILLARY: 126 mg/dL — AB (ref 65–99)
GLUCOSE-CAPILLARY: 137 mg/dL — AB (ref 65–99)
GLUCOSE-CAPILLARY: 158 mg/dL — AB (ref 65–99)
Glucose-Capillary: 159 mg/dL — ABNORMAL HIGH (ref 65–99)
Glucose-Capillary: 150 mg/dL — ABNORMAL HIGH (ref 65–99)

## 2015-09-18 NOTE — Progress Notes (Signed)
Cardiology to see as needed over the weekend Please call with questions  Hillis Range MD, [facility] 09/18/2015 10:08 AM

## 2015-09-18 NOTE — Consult Note (Signed)
Consultation Note Date: 09/18/2015   Patient Name: [name]  DOB: 1930/08/08  MRN: [id number]  Age / Sex: 81 y.o., male   PCP: Aaron Sites, MD Referring Physician: Earnie Larsson, MD  Reason for Consultation: Establishing goals of care  Palliative Care Assessment and Plan Summary of Established Goals of Care and Medical Treatment Preferences   Clinical Assessment/Narrative: 81 year old male admitted for cervical decompression who underwent laminectomy on 9/19. Postoperative course complicated by left-sided hemiparesis that revealed right MCA ischemic infarct, atrial fibrillation, aspiration pneumonia, UTI, and persistent dysphagia.  Palliative consulted for goals of care including plan for continued dysphagia (PEG vs comfort feeds)  I met with the patient and his son, [name]. The patient expressed preference that I speak with his son regarding plan of care moving forward.  I met with [name] and we began conversation by discussing what is most important to [name].  His son reports that his family, being independent as possible, his faith, and being out of the hospital the most important things to [name]. He reports that he has worked hard his entire life including 40+ years for the [employer].  We reviewed his clinical course to this point in time, and [name] reports that the doctors have been doing a good job of updating him on his father's condition.  He understands that his father had a stroke and there is a possibility that he regains a great deal of functional status, no functional status, or most likely somewhere in between these 2 extremes. He, his children, and [name] her invested in a plan to transition to skilled facility for rehabilitation next week. We discussed what past for it may look like including successful rehabilitation in which case would celebrate [name]'s recovery.  We also discussed that it is probably more likely that he will not make  significant gains in his functional status and will let him live independently again.  If this is the case, his son wants to ensure that his father has the best quality of life possible whenever time remains. We discussed options that would be available in this scenario including home with hospice, hospice at a residential facility, or hospice at long-term facility that he is residing.  We talked about developing a plan to outline his father's wishes moving forward. He and I reviewed a MOST form and talked about how the care that his father receives moving forward should be focused on interventions and therapies that are most likely to help him achieve his goal of returning to his home. We talked about heroic measures at the end-of-life and how this would not likely lead to his father getting well enough to leave the hospital live independently again.  He would like to discuss this with his children but feels that completion of a most form would be in his father's best interest prior to discharge to skilled facility.  On review today, he feels that his father's wishes would be DO NOT RESUSCITATE, limited additional interventions, antibiotics or IV fluids if indicated, and long-term feeding tube. He was provided with a copy of most form with these selections indicated to review with his children. We will follow-up tomorrow or Monday morning in order to complete this.  We also discussed specifically the burden and benefit of PEG tube placement and his father's case. We discussed alternatives including feeding for comfort with understanding of risk of aspiration. We also discussed the placement of a PEG tube probably would not significantly decrease  risk of aspiration as he could still aspirate saliva or tube feeds that are refluxed. We talked about ulcerations and infections that can result from feeding tube. I provided him with a copy of Hard Choices for Loving People to review. He reports having good  understanding of the procedure and that he and his father had discussed it and they would like to proceed with PEG tube placement.  I discussed with him the concept of time-limited trial and that they may want to reassess the effectiveness of PEG tube and a couple of months with plan to discontinue and focus on comfort feeds if his father's not benefiting from therapy.  - I met with the patient's son, [name]. I also discussed with his grandson, [name], yesterday. Following conversation with his son I went and reviewed our conversation with the patient. [name] reports he is in agreement with plan to complete MOST form and to proceed with PEG tube placement. - Patient would like to proceed with PEG tube placement. I discussed the benefits and burdens of PEG tube placement with family in detail. - I reviewed a MOST with the patient's son. He is going to go over this with his children and would like to complete this prior to his father transitioning to skilled facility for rehabilitation.  Contacts/Participants in Discussion: Primary Decision Maker: Patient and his son [name]    HCPOA: No  Code Status/Advance Care Planning:  Full code  Additional Recommendations (Limitations, Scope, Preferences):  I met with the patient's son, [name]. We reviewed a MOST form and he agrees it would be useful tool moving forward in his father's care. He would like to review it with his son who is an EMT and his daughter who works as a Passenger transport manager at [employer].  He is agreeable to meeting tomorrow or Monday in order to complete MOST form  The patient and family would like to proceed with PEG tube placement  Psycho-social/Spiritual:   Support System: Strong with family support  Desire for further Chaplaincy support:no  Prognosis: Unable to determine  Discharge Planning:  Skilled rehabilitation facility       Chief Complaint/History of Present Illness:  81 year old male admitted for cervical  decompression underwent laminectomy on 9/19. Postoperative course complicated by left-sided hemiparesis that revealed right MCA ischemic infarct, atrial fibrillation, aspiration pneumonia, UTI, and persistent dysphagia.  Primary Diagnoses  Present on Admission:  . Spinal stenosis in cervical region . Spinal stenosis, lumbar region, with neurogenic claudication  Palliative Review of Systems: Patient remains confused but denies symptoms at this time I have reviewed the medical record, interviewed the patient and family, and examined the patient. The following aspects are pertinent.  Past Medical History  Diagnosis Date  . Hypertension   . Diabetes mellitus without complication   . DDD (degenerative disc disease)   . Vertigo   . High cholesterol   . Peripheral vascular disease   . Pneumonia   . History of kidney stones   . Hx of gallstones   . GERD (gastroesophageal reflux disease)   . Headache   . Wears dentures    Social History   Social History  . Marital Status: Widowed    Spouse Name: N/A  . Number of Children: 1  . Years of Education: GED   Occupational History  . Retired    Social History Main Topics  . Smoking status: Former Research scientist (life sciences)  . Smokeless tobacco: Never Used     Comment: Quit smoking in 1979  .  Alcohol Use: No  . Drug Use: No  . Sexual Activity: Not Asked   Other Topics Concern  . None   Social History Narrative   Lives at home with a caregiver.   Right-handed.   Occasional caffeine use.   History reviewed. No pertinent family history. Scheduled Meds: . antiseptic oral rinse  7 mL Mouth Rinse q12n4p  . aspirin EC  81 mg Oral Daily  . chlorhexidine  15 mL Mouth Rinse BID  . clopidogrel  75 mg Oral Daily  . feeding supplement (PRO-STAT SUGAR FREE 64)  30 mL Per Tube BID  . fluconazole  100 mg Per Tube Daily  . folic acid  1 mg Oral Daily  . heparin subcutaneous  5,000 Units Subcutaneous 3 times per day  . insulin aspart  0-15 Units Subcutaneous  6 times per day  . metoprolol tartrate  25 mg Oral BID  . pantoprazole (PROTONIX) IV  40 mg Intravenous QHS  . potassium chloride  40 mEq Per Tube Once  . rosuvastatin  10 mg Oral Daily  . sulfamethoxazole-trimethoprim  20 mL Per Tube Q12H   Continuous Infusions: . feeding supplement (JEVITY 1.2 CAL) 1,000 mL (09/18/15 0731)  . sodium chloride 0.9 % 1,000 mL with potassium chloride 40 mEq infusion 75 mL/hr at 09/18/15 1957   PRN Meds:.acetaminophen **OR** acetaminophen, cyclobenzaprine, iohexol, iohexol, loperamide, menthol-cetylpyridinium **OR** phenol, metoprolol, midazolam, midazolam, ondansetron (ZOFRAN) IV Medications Prior to Admission:  Prior to Admission medications   Medication Sig Start Date End Date Taking? Authorizing Provider  aspirin EC 81 MG tablet Take 81 mg by mouth daily.   Yes Historical Provider, MD  folic acid (FOLVITE) 1 MG tablet Take 1 mg by mouth daily.  06/01/15  Yes Historical Provider, MD  hydrochlorothiazide (HYDRODIURIL) 25 MG tablet Take 25 mg by mouth daily as needed (for leg swelling).  07/14/15  Yes Historical Provider, MD  metFORMIN (GLUCOPHAGE) 500 MG tablet Take 500 mg by mouth 2 (two) times daily.   Yes Historical Provider, MD  methotrexate 2.5 MG tablet Take 20 mg by mouth every Saturday. Takes every saturday   Yes Historical Provider, MD  propranolol (INNOPRAN XL) 80 MG 24 hr capsule Take 80 mg by mouth at bedtime.   Yes Historical Provider, MD  rosuvastatin (CRESTOR) 10 MG tablet Take 10 mg by mouth daily.   Yes Historical Provider, MD  telmisartan (MICARDIS) 80 MG tablet Take 80 mg by mouth daily.   Yes Historical Provider, MD  oxyCODONE-acetaminophen (PERCOCET/ROXICET) 5-325 MG per tablet Take 1 tablet by mouth every 4 (four) hours as needed for pain.    Historical Provider, MD   Allergies  Allergen Reactions  . Penicillins Other (See Comments)    unknown  . Adhesive [Tape] Rash   CBC:    Component Value Date/Time   WBC 7.3 09/14/2015 0532    HGB 10.6* 09/14/2015 0532   HCT 31.8* 09/14/2015 0532   PLT 130* 09/14/2015 0532   MCV 89.6 09/14/2015 0532   NEUTROABS 8.3* 09/11/2015 2315   LYMPHSABS 0.6* 09/11/2015 2315   MONOABS 1.1* 09/11/2015 2315   EOSABS 0.0 09/11/2015 2315   BASOSABS 0.0 09/11/2015 2315   Comprehensive Metabolic Panel:    Component Value Date/Time   NA 136 09/16/2015 0405   K 4.2 09/16/2015 0405   CL 104 09/16/2015 0405   CO2 25 09/16/2015 0405   BUN 16 09/16/2015 0405   CREATININE 0.70 09/16/2015 0405   GLUCOSE 206* 09/16/2015 0405   CALCIUM 8.0*  09/16/2015 0405   AST 33 09/15/2015 0726   ALT 23 09/15/2015 0726   ALKPHOS 73 09/15/2015 0726   BILITOT 1.7* 09/15/2015 0726   PROT 5.4* 09/15/2015 0726   ALBUMIN 1.9* 09/15/2015 0726    Physical Exam: Vital Signs: BP 143/64 mmHg  Pulse 101  Temp(Src) 98.6 F (37 C) (Axillary)  Resp 20  Ht _0  (1.854 m)  Wt 85.5 kg (188 lb 7.9 oz)  BMI 24.87 kg/m2  SpO2 98% SpO2: SpO2: 98 % O2 Device: O2 Device: Not Delivered O2 Flow Rate: O2 Flow Rate (L/min): 1 L/min Intake/output summary:  Intake/Output Summary (Last 24 hours) at 09/18/15 2042 Last data filed at 09/18/15 2000  Gross per 24 hour  Intake      0 ml  Output   2600 ml  Net  -2600 ml   LBM: Last BM Date: 09/13/15 Baseline Weight: Weight: 85.276 kg (188 lb) Most recent weight: Weight: 85.5 kg (188 lb 7.9 oz)  Exam Findings:   General: Pt is alert, not in acute distress  HEENT: No icterus, + thrush, oral mucosa moist, c-collar in place, Panda tube in left nostril  Cardiovascular: regular rate and rhythm, S1/S2 No murmur  Respiratory: clear to auscultation bilaterally   Abdomen: Soft, +Bowel sounds, non tender, non distended, no guarding  MSK: No LE edema, cyanosis or clubbing  Neurologic: Cranial nerves appear grossly intact. He does not move left side         Palliative Performance Scale: 30               Additional Data Reviewed: Recent Labs     09/16/15  0405  NA   136  BUN  16  CREATININE  0.70     Time In: 250 Time Out: 405 Time Total: 75 Greater than 50%  of this time was spent counseling and coordinating care related to the above assessment and plan.  Signed by: Micheline Rough, MD  Micheline Rough, MD  09/18/2015, 8:42 PM  Please contact Palliative Medicine Team phone at [phone] for questions and concerns.

## 2015-09-18 NOTE — Progress Notes (Signed)
[facility] Consult Note   [name]  ZOX:[id number]  DOB: 1930-08-08  DOA: 09/06/2015 PCP: Colette Ribas, MD  Brief narrative: [name] is a 81 y.o. male with hypertension, diabetes, hyperlipidemia, PAD. He was admitted for cervical decompression and underwent a laminectomy on 9/19. He was intubated from 9/19 through 9/21 Postoperative he was noted to have dense left-sided hemiparesis and workup revealed a right MCA ischemic infarct. The hospital course was subsequently complicated by atrial fibrillation (RVR on 9/25), aspiration pneumonia , UTI and dysphagia.  Subjective: Patient is sleeping but easily awakens. No complaints of pain, nausea, vomiting, diarrhea, shortness of breath.  Assessment/Plan: Principal Problem:   Spinal stenosis in cervical & lumbar region -Management per neurosurgery  Active Problems:    Acute right MCA stroke- bilateral carotid artery stenosis -Continues to have no mobility on the left upper and lower extremity and dysphagia -Continue NG tube-patient considering PEG tube -Echo revealed EF of 60-65%-no thrombus -Carotid duplex and MRA of the neck revealing bilateral ICA stenosis of up to 60% at the origins- outpatient vascular surgery follow-up - NOAC to be started on 10/5-in the interim we are to continue aspirin and Plavix -Continue statin -LDL 41 -A1c 6.1  Dysphagia -Secondary to CVA, recent C-spine surgery and generalized weakness -Has had an SLP eval including a modified barium swallow which suggests that he is a high risk for aspiration -He is considering a PEG tube rather than starting on a dysphagia diet at this time -Palliative care consult requested to discuss PEG tube  A. fib with RVR-paroxysmal -Continue beta blocker-start-NOAC on 10/5 cardiology recommends eliquis  Escherichia coli UTI -Sensitive to Bactrim-stop date 10/4  Aspiration pneumonia -Bilateral consolidation at the lung bases noted on chest  x-ray -Levaquin started on 9/25- will complete a 7 day course today  Thrush -Continue Diflucan- stop date 10/4    Tobacco use disorder -Counseled to discontinue    HLD (hyperlipidemia) Continue statin  Diabetes mellitus type 2 -Takes metformin at home -Continue sliding scale insulin   Code Status:     Code Status Orders        Start     Ordered   09/06/15 1522  Full code   Continuous     09/06/15 1521    * Antibiotics: Anti-infectives    Start     Dose/Rate Route Frequency Ordered Stop   09/15/15 1015  fluconazole (DIFLUCAN) tablet 100 mg  Status:  Discontinued     100 mg Oral Daily 09/15/15 1007 09/15/15 1008   09/15/15 1015  fluconazole (DIFLUCAN) tablet 100 mg     100 mg Per Tube Daily 09/15/15 1008     09/15/15 1000  levofloxacin (LEVAQUIN) IVPB 750 mg     750 mg 100 mL/hr over 90 Minutes Intravenous Every 24 hours 09/14/15 1157 09/18/15 1202   09/14/15 1300  sulfamethoxazole-trimethoprim (BACTRIM,SEPTRA) 200-40 MG/5ML suspension 20 mL     20 mL Per Tube Every 12 hours 09/14/15 1201 09/21/15 2359   09/12/15 1000  levofloxacin (LEVAQUIN) IVPB 500 mg  Status:  Discontinued     500 mg 100 mL/hr over 60 Minutes Intravenous Every 24 hours 09/12/15 0954 09/14/15 1157   09/10/15 1800  vancomycin (VANCOCIN) 1,250 mg in sodium chloride 0.9 % 250 mL IVPB  Status:  Discontinued     1,250 mg 166.7 mL/hr over 90 Minutes Intravenous Every 24 hours 09/09/15 2024 09/11/15 1122   09/06/15 1830  vancomycin (VANCOCIN) IVPB 1000 mg/200 mL premix  Status:  Discontinued     1,000 mg 200 mL/hr over 60 Minutes Intravenous Every 12 hours 09/06/15 1534 09/09/15 2024   09/06/15 0715  bacitracin 50,000 Units in sodium chloride irrigation 0.9 % 500 mL irrigation  Status:  Discontinued       As needed 09/06/15 0900 09/06/15 1300   09/06/15 0644  vancomycin (VANCOCIN) IVPB 1000 mg/200 mL premix     1,000 mg 200 mL/hr over 60 Minutes Intravenous On call to O.R. 09/06/15 0644 09/06/15 0852       Objective: Filed Weights   09/16/15 0532 09/17/15 0523 09/18/15 0500  Weight: 87.2 kg (192 lb 3.9 oz) 88.2 kg (194 lb 7.1 oz) 85.5 kg (188 lb 7.9 oz)    Intake/Output Summary (Last 24 hours) at 09/18/15 1550 Last data filed at 09/18/15 0533  Gross per 24 hour  Intake      0 ml  Output   1550 ml  Net  -1550 ml     Vitals Filed Vitals:   09/18/15 0500 09/18/15 0532 09/18/15 0926 09/18/15 1358  BP:  177/83 149/68 129/74  Pulse:  110 101 92  Temp:  98.5 F (36.9 C) 99.1 F (37.3 C) 99.1 F (37.3 C)  TempSrc:  Axillary Axillary Axillary  Resp:  Height:      Weight: 85.5 kg (188 lb 7.9 oz)     SpO2:  97% 95% 96%    Exam:  General:  Pt is alert, not in acute distress  HEENT: No icterus, + thrush, oral mucosa moist, c-collar in place, Panda tube in left nostril  Cardiovascular: regular rate and rhythm, S1/S2 No murmur  Respiratory: clear to auscultation bilaterally   Abdomen: Soft, +Bowel sounds, non tender, non distended, no guarding  MSK: No LE edema, cyanosis or clubbing  Neurologic: 0 over 5 strength in left upper and lower extremity, right side 5 over 5, cranial nerves II through XII intact  Data Reviewed: Basic Metabolic Panel:  Recent Labs Lab 09/11/15 1959 09/13/15 0230 09/14/15 0532 09/15/15 0726 09/16/15 0405  NA 139 142 137 135 136  K 3.5 3.4* 3.3* 3.4* 4.2  CL 106 106 104 102 104  CO2 GLUCOSE 141* 133* 154* 129* 206*  BUN CREATININE 0.76 0.69 0.74 0.74 0.70  CALCIUM 8.2* 8.3* 7.8* 7.9* 8.0*  MG  --   --  1.6* 1.6*  --   PHOS  --   --  1.8*  --   --    Liver Function Tests:  Recent Labs Lab 09/11/15 1959 09/15/15 0726  AST 24 33  ALT 18 23  ALKPHOS 68 73  BILITOT 1.7* 1.7*  PROT 5.3* 5.4*  ALBUMIN 2.3* 1.9*   No results for input(s): LIPASE, AMYLASE in the last 168 hours. No results for input(s): AMMONIA in the last 168 hours. CBC:  Recent Labs Lab 09/11/15 2315 09/13/15 0230  09/14/15 0532  WBC 10.0 8.1 7.3  NEUTROABS 8.3*  --   --   HGB 11.9* 11.0* 10.6*  HCT 34.3* 32.7* 31.8*  MCV 87.7 88.4 89.6  PLT 95* 106* 130*   Cardiac Enzymes:  Recent Labs Lab 09/11/15 1959  CKTOTAL 58  CKMB 2.0  TROPONINI <0.03   BNP (last 3 results) No results for input(s): BNP in the last 8760 hours.  ProBNP (last 3 results) No results for input(s): PROBNP in the last 8760 hours.  CBG:  Recent Labs Lab 09/17/15 1556  09/17/15 2001 09/18/15 0006 09/18/15 0406 09/18/15 1141  GLUCAP 117* 124* 137* 158* 159*    Recent Results (from the past 240 hour(s))  Culture, blood (routine x 2)     Status: None   Collection Time: 09/11/15 10:59 PM  Result Value Ref Range Status   Specimen Description BLOOD LEFT HAND  Final   Special Requests BOTTLES DRAWN AEROBIC AND ANAEROBIC 5CC  Final   Culture NO GROWTH 5 DAYS  Final   Report Status 09/16/2015 FINAL  Final  Culture, blood (routine x 2)     Status: None   Collection Time: 09/11/15 11:09 PM  Result Value Ref Range Status   Specimen Description BLOOD RIGHT HAND  Final   Special Requests IN PEDIATRIC BOTTLE 1CC  Final   Culture NO GROWTH 5 DAYS  Final   Report Status 09/16/2015 FINAL  Final  Culture, Urine     Status: None   Collection Time: 09/11/15 11:54 PM  Result Value Ref Range Status   Specimen Description URINE, RANDOM  Final   Special Requests NONE  Final   Culture 50,000 COLONIES/mL YEAST  Final   Report Status 09/13/2015 FINAL  Final  Culture, Urine     Status: None   Collection Time: 09/12/15 12:45 PM  Result Value Ref Range Status   Specimen Description URINE, CLEAN CATCH  Final   Special Requests Normal  Final   Culture >=100,000 COLONIES/mL ESCHERICHIA COLI  Final   Report Status 09/14/2015 FINAL  Final   Organism ID, Bacteria ESCHERICHIA COLI  Final      Susceptibility   Escherichia coli - MIC*    AMPICILLIN >=32 RESISTANT Resistant     CEFAZOLIN <=4 SENSITIVE Sensitive     CEFTRIAXONE <=1  SENSITIVE Sensitive     CIPROFLOXACIN >=4 RESISTANT Resistant     GENTAMICIN <=1 SENSITIVE Sensitive     IMIPENEM <=0.25 SENSITIVE Sensitive     NITROFURANTOIN <=16 SENSITIVE Sensitive     TRIMETH/SULFA <=20 SENSITIVE Sensitive     AMPICILLIN/SULBACTAM >=32 RESISTANT Resistant     PIP/TAZO <=4 SENSITIVE Sensitive     * >=100,000 COLONIES/mL ESCHERICHIA COLI     Studies: No results found.  Scheduled Meds:  Scheduled Meds: . antiseptic oral rinse  7 mL Mouth Rinse q12n4p  . aspirin EC  81 mg Oral Daily  . chlorhexidine  15 mL Mouth Rinse BID  . clopidogrel  75 mg Oral Daily  . feeding supplement (PRO-STAT SUGAR FREE 64)  30 mL Per Tube BID  . fluconazole  100 mg Per Tube Daily  . folic acid  1 mg Oral Daily  . heparin subcutaneous  5,000 Units Subcutaneous 3 times per day  . insulin aspart  0-15 Units Subcutaneous 6 times per day  . metoprolol tartrate  25 mg Oral BID  . pantoprazole (PROTONIX) IV  40 mg Intravenous QHS  . potassium chloride  40 mEq Per Tube Once  . rosuvastatin  10 mg Oral Daily  . sulfamethoxazole-trimethoprim  20 mL Per Tube Q12H   Continuous Infusions: . feeding supplement (JEVITY 1.2 CAL) 1,000 mL (09/18/15 0731)  . sodium chloride 0.9 % 1,000 mL with potassium chloride 40 mEq infusion 75 mL/hr at 09/18/15 0453    Time spent on care of this patient: 35 min   Madysun Thall, MD 09/18/2015, 3:50 PM  LOS: 12 days   [facility] Office  [phone] Pager - Text Page per [email or web address] If 7PM-7AM, please contact night-coverage [email or web address]

## 2015-09-18 NOTE — Progress Notes (Signed)
Patient ID: [name], male   DOB: 1930/12/17, 81 y.o.   MRN: [id number] Stable, c/o of no pain. Left hemiparesis, vs wnl.

## 2015-09-19 DIAGNOSIS — I481 Persistent atrial fibrillation: Secondary | ICD-10-CM

## 2015-09-19 DIAGNOSIS — Z7189 Other specified counseling: Secondary | ICD-10-CM | POA: Insufficient documentation

## 2015-09-19 DIAGNOSIS — J9600 Acute respiratory failure, unspecified whether with hypoxia or hypercapnia: Secondary | ICD-10-CM

## 2015-09-19 DIAGNOSIS — Z515 Encounter for palliative care: Secondary | ICD-10-CM | POA: Insufficient documentation

## 2015-09-19 DIAGNOSIS — G8194 Hemiplegia, unspecified affecting left nondominant side: Secondary | ICD-10-CM | POA: Insufficient documentation

## 2015-09-19 LAB — GLUCOSE, CAPILLARY
GLUCOSE-CAPILLARY: 137 mg/dL — AB (ref 65–99)
GLUCOSE-CAPILLARY: 151 mg/dL — AB (ref 65–99)
GLUCOSE-CAPILLARY: 170 mg/dL — AB (ref 65–99)
Glucose-Capillary: 121 mg/dL — ABNORMAL HIGH (ref 65–99)
Glucose-Capillary: 138 mg/dL — ABNORMAL HIGH (ref 65–99)
Glucose-Capillary: 153 mg/dL — ABNORMAL HIGH (ref 65–99)
Glucose-Capillary: 176 mg/dL — ABNORMAL HIGH (ref 65–99)

## 2015-09-19 NOTE — Progress Notes (Signed)
Overall stable. Denies pain.  Afebrile. Vitals stable. Motor and sensory exam unchanged. Wounds clean and dry.  Plan for PEG placement and probable discharge to skilled nursing facility next week.

## 2015-09-19 NOTE — Progress Notes (Signed)
[facility] Consult Note   [name]  ZOX:[id number]  DOB: 1930/03/20  DOA: 09/06/2015 PCP: Colette Ribas, MD  Brief narrative: [name] is a 81 y.o. male with hypertension, diabetes, hyperlipidemia, PAD. He was admitted for cervical decompression and underwent a laminectomy on 9/19. He was intubated from 9/19 through 9/21 Postoperative he was noted to have dense left-sided hemiparesis and workup revealed a right MCA ischemic infarct. The hospital course was subsequently complicated by atrial fibrillation (RVR on 9/25), aspiration pneumonia , UTI and dysphagia.  Subjective: Quite sleepy again today. Awakens and is oriented. Tells me he is decided to have a PEG tube placed. He has no complaints of pain, nausea, vomiting, abdominal pain or dyspnea.  Assessment/Plan: Principal Problem:   Spinal stenosis in cervical & lumbar region -Management per neurosurgery  Active Problems:    Acute right MCA stroke- bilateral carotid artery stenosis -Continues to have no mobility on the left upper and lower extremity and dysphagia -Continue NG tube-patient considering PEG tube -Echo revealed EF of 60-65%-no thrombus -Carotid duplex and MRA of the neck revealing bilateral ICA stenosis of up to 60% at the origins- outpatient vascular surgery follow-up - NOAC to be started on 10/5-in the interim we are to continue aspirin and Plavix -Continue statin -LDL 41 -A1c 6.1  Dysphagia -Secondary to CVA, recent C-spine surgery and generalized weakness -Has had an SLP eval including a modified barium swallow which suggests that he is a high risk for aspiration -The patient and family have decided to have a PEG tube placed  A. fib with RVR-paroxysmal -Continue beta blocker-start-NOAC on 10/5 cardiology recommends eliquis  Escherichia coli UTI -Sensitive to Bactrim-stop date 10/4  Aspiration pneumonia -Bilateral consolidation at the lung bases noted on chest x-ray -Levaquin  started on 9/25- completed a 7 day course on 10/1  Thrush -Continue Diflucan- stop date 10/4    Tobacco use disorder -Counseled to discontinue    HLD (hyperlipidemia) Continue statin  Diabetes mellitus type 2 -Takes metformin at home -Continue sliding scale insulin   Code Status:     Code Status Orders        Start     Ordered   09/06/15 1522  Full code   Continuous     09/06/15 1521    * Antibiotics: Anti-infectives    Start     Dose/Rate Route Frequency Ordered Stop   09/15/15 1015  fluconazole (DIFLUCAN) tablet 100 mg  Status:  Discontinued     100 mg Oral Daily 09/15/15 1007 09/15/15 1008   09/15/15 1015  fluconazole (DIFLUCAN) tablet 100 mg     100 mg Per Tube Daily 09/15/15 1008     09/15/15 1000  levofloxacin (LEVAQUIN) IVPB 750 mg     750 mg 100 mL/hr over 90 Minutes Intravenous Every 24 hours 09/14/15 1157 09/18/15 1202   09/14/15 1300  sulfamethoxazole-trimethoprim (BACTRIM,SEPTRA) 200-40 MG/5ML suspension 20 mL     20 mL Per Tube Every 12 hours 09/14/15 1201 09/21/15 2359   09/12/15 1000  levofloxacin (LEVAQUIN) IVPB 500 mg  Status:  Discontinued     500 mg 100 mL/hr over 60 Minutes Intravenous Every 24 hours 09/12/15 0954 09/14/15 1157   09/10/15 1800  vancomycin (VANCOCIN) 1,250 mg in sodium chloride 0.9 % 250 mL IVPB  Status:  Discontinued     1,250 mg 166.7 mL/hr over 90 Minutes Intravenous Every 24 hours 09/09/15 2024 09/11/15 1122   09/06/15 1830  vancomycin (VANCOCIN) IVPB 1000 mg/200 mL premix  Status:  Discontinued     1,000 mg 200 mL/hr over 60 Minutes Intravenous Every 12 hours 09/06/15 1534 09/09/15 2024   09/06/15 0715  bacitracin 50,000 Units in sodium chloride irrigation 0.9 % 500 mL irrigation  Status:  Discontinued       As needed 09/06/15 0900 09/06/15 1300   09/06/15 0644  vancomycin (VANCOCIN) IVPB 1000 mg/200 mL premix     1,000 mg 200 mL/hr over 60 Minutes Intravenous On call to O.R. 09/06/15 0644 09/06/15 0852       Objective: Filed Weights   09/17/15 0523 09/18/15 0500 09/19/15 0522  Weight: 88.2 kg (194 lb 7.1 oz) 85.5 kg (188 lb 7.9 oz) 85.56 kg (188 lb 10 oz)    Intake/Output Summary (Last 24 hours) at 09/19/15 1103 Last data filed at 09/19/15 0523  Gross per 24 hour  Intake      0 ml  Output   3100 ml  Net  -3100 ml     Vitals Filed Vitals:   09/18/15 2127 09/19/15 0107 09/19/15 0522 09/19/15 1004  BP: 139/65 119/68 137/59 149/77  Pulse: 105 90 86 111  Temp: 98.9 F (37.2 C) 99.1 F (37.3 C) 99 F (37.2 C) 98.6 F (37 C)  TempSrc: Axillary Axillary Axillary Axillary  Resp: Height:      Weight:   85.56 kg (188 lb 10 oz)   SpO2: 99% 98% 96% 95%    Exam:  General:  Pt is alert, not in acute distress  HEENT: No icterus, + thrush and severely dry oral mucosa(mouth breather), c-collar in place, Panda tube in left nostril  Cardiovascular: regular rate and rhythm, S1/S2 No murmur  Respiratory: clear to auscultation bilaterally   Abdomen: Soft, +Bowel sounds, non tender, non distended, no guarding  MSK: No LE edema, cyanosis or clubbing  Neurologic: 0 over 5 strength in left upper and lower extremity, right side 5 over 5, cranial nerves II through XII intact  Data Reviewed: Basic Metabolic Panel:  Recent Labs Lab 09/13/15 0230 09/14/15 0532 09/15/15 0726 09/16/15 0405  NA 142 137 135 136  K 3.4* 3.3* 3.4* 4.2  CL 106 104 102 104  CO2 GLUCOSE 133* 154* 129* 206*  BUN CREATININE 0.69 0.74 0.74 0.70  CALCIUM 8.3* 7.8* 7.9* 8.0*  MG  --  1.6* 1.6*  --   PHOS  --  1.8*  --   --    Liver Function Tests:  Recent Labs Lab 09/15/15 0726  AST 33  ALT 23  ALKPHOS 73  BILITOT 1.7*  PROT 5.4*  ALBUMIN 1.9*   No results for input(s): LIPASE, AMYLASE in the last 168 hours. No results for input(s): AMMONIA in the last 168 hours. CBC:  Recent Labs Lab 09/13/15 0230 09/14/15 0532  WBC 8.1 7.3  HGB 11.0* 10.6*  HCT  32.7* 31.8*  MCV 88.4 89.6  PLT 106* 130*   Cardiac Enzymes: No results for input(s): CKTOTAL, CKMB, CKMBINDEX, TROPONINI in the last 168 hours. BNP (last 3 results) No results for input(s): BNP in the last 8760 hours.  ProBNP (last 3 results) No results for input(s): PROBNP in the last 8760 hours.  CBG:  Recent Labs Lab 09/18/15 1603 09/18/15 2011 09/19/15 0019 09/19/15 0413 09/19/15 0751  GLUCAP 126* 150* 176* 137* 151*    Recent Results (from the past 240 hour(s))  Culture, blood (routine x 2)     Status:  None   Collection Time: 09/11/15 10:59 PM  Result Value Ref Range Status   Specimen Description BLOOD LEFT HAND  Final   Special Requests BOTTLES DRAWN AEROBIC AND ANAEROBIC 5CC  Final   Culture NO GROWTH 5 DAYS  Final   Report Status 09/16/2015 FINAL  Final  Culture, blood (routine x 2)     Status: None   Collection Time: 09/11/15 11:09 PM  Result Value Ref Range Status   Specimen Description BLOOD RIGHT HAND  Final   Special Requests IN PEDIATRIC BOTTLE 1CC  Final   Culture NO GROWTH 5 DAYS  Final   Report Status 09/16/2015 FINAL  Final  Culture, Urine     Status: None   Collection Time: 09/11/15 11:54 PM  Result Value Ref Range Status   Specimen Description URINE, RANDOM  Final   Special Requests NONE  Final   Culture 50,000 COLONIES/mL YEAST  Final   Report Status 09/13/2015 FINAL  Final  Culture, Urine     Status: None   Collection Time: 09/12/15 12:45 PM  Result Value Ref Range Status   Specimen Description URINE, CLEAN CATCH  Final   Special Requests Normal  Final   Culture >=100,000 COLONIES/mL ESCHERICHIA COLI  Final   Report Status 09/14/2015 FINAL  Final   Organism ID, Bacteria ESCHERICHIA COLI  Final      Susceptibility   Escherichia coli - MIC*    AMPICILLIN >=32 RESISTANT Resistant     CEFAZOLIN <=4 SENSITIVE Sensitive     CEFTRIAXONE <=1 SENSITIVE Sensitive     CIPROFLOXACIN >=4 RESISTANT Resistant     GENTAMICIN <=1 SENSITIVE Sensitive      IMIPENEM <=0.25 SENSITIVE Sensitive     NITROFURANTOIN <=16 SENSITIVE Sensitive     TRIMETH/SULFA <=20 SENSITIVE Sensitive     AMPICILLIN/SULBACTAM >=32 RESISTANT Resistant     PIP/TAZO <=4 SENSITIVE Sensitive     * >=100,000 COLONIES/mL ESCHERICHIA COLI     Studies: No results found.  Scheduled Meds:  Scheduled Meds: . antiseptic oral rinse  7 mL Mouth Rinse q12n4p  . aspirin EC  81 mg Oral Daily  . chlorhexidine  15 mL Mouth Rinse BID  . clopidogrel  75 mg Oral Daily  . feeding supplement (PRO-STAT SUGAR FREE 64)  30 mL Per Tube BID  . fluconazole  100 mg Per Tube Daily  . folic acid  1 mg Oral Daily  . heparin subcutaneous  5,000 Units Subcutaneous 3 times per day  . insulin aspart  0-15 Units Subcutaneous 6 times per day  . metoprolol tartrate  25 mg Oral BID  . pantoprazole (PROTONIX) IV  40 mg Intravenous QHS  . potassium chloride  40 mEq Per Tube Once  . rosuvastatin  10 mg Oral Daily  . sulfamethoxazole-trimethoprim  20 mL Per Tube Q12H   Continuous Infusions: . feeding supplement (JEVITY 1.2 CAL) 1,000 mL (09/18/15 2330)  . sodium chloride 0.9 % 1,000 mL with potassium chloride 40 mEq infusion 75 mL/hr at 09/19/15 0757    Time spent on care of this patient: 35 min   Daxson Reffett, MD 09/19/2015, 11:03 AM  LOS: 13 days   [facility] Office  [phone] Pager - Text Page per [email or web address] If 7PM-7AM, please contact night-coverage [email or web address]

## 2015-09-19 NOTE — Progress Notes (Signed)
Daily Progress Note   Patient Name: [name]       Date: 09/19/2015 DOB: 1930-11-22  Age: 81 y.o. MRN#: [id number] Attending Physician: Earnie Larsson, MD Primary Care Physician: Purvis Kilts, MD Admit Date: 09/06/2015  Reason for Consultation/Follow-up: Establishing goals of care  Subjective: 81 year old male admitted for cervical decompression who underwent laminectomy on 9/19. Postoperative course complicated by left-sided hemiparesis that revealed right MCA ischemic infarct, atrial fibrillation, aspiration pneumonia, UTI, and persistent dysphagia. Palliative consulted for goals of care including plan for continued dysphagia (PEG vs comfort feeds)  Interval Events:  I had a call from bedside nurse asking to stop by and speak with [name] son. On arrival to room, [name] reports he is doing well today.  His son, [name], was there as well. He reports he is able to talk to his children about our conversation yesterday and he would like to complete a MOST form to be taken with [name] to rehabilitation center.  [name] would also like to find out what time his father is going to go for PEG tube placement so he can be at the hospital. His nurse contacted his primary hospitalist to let them know that [name] son would like to be notified once PEG tube placement set up.   Length of Stay: 13 days  Current Medications: Scheduled Meds:  . antiseptic oral rinse  7 mL Mouth Rinse q12n4p  . aspirin EC  81 mg Oral Daily  . chlorhexidine  15 mL Mouth Rinse BID  . clopidogrel  75 mg Oral Daily  . feeding supplement (PRO-STAT SUGAR FREE 64)  30 mL Per Tube BID  . fluconazole  100 mg Per Tube Daily  . folic acid  1 mg Oral Daily  . heparin subcutaneous  5,000 Units Subcutaneous 3 times per day  . insulin aspart  0-15 Units Subcutaneous 6 times per day  . metoprolol tartrate  25 mg Oral BID  . pantoprazole (PROTONIX) IV  40 mg Intravenous QHS  . potassium chloride  40 mEq  Per Tube Once  . rosuvastatin  10 mg Oral Daily  . sulfamethoxazole-trimethoprim  20 mL Per Tube Q12H    Continuous Infusions: . feeding supplement (JEVITY 1.2 CAL) 1,000 mL (09/18/15 2330)  . sodium chloride 0.9 % 1,000 mL with potassium chloride 40 mEq infusion 75 mL/hr at 09/19/15 0757    PRN Meds: acetaminophen **OR** acetaminophen, cyclobenzaprine, iohexol, iohexol, loperamide, menthol-cetylpyridinium **OR** phenol, metoprolol, midazolam, midazolam, ondansetron (ZOFRAN) IV  Palliative Performance Scale:30%     Vital Signs: BP 135/64 mmHg  Pulse 92  Temp(Src) 97.9 F (36.6 C) (Oral)  Resp 19  Ht 6' 1"  (1.854 m)  Wt 85.56 kg (188 lb 10 oz)  BMI 24.89 kg/m2  SpO2 98% SpO2: SpO2: 98 % O2 Device: O2 Device: Not Delivered O2 Flow Rate: O2 Flow Rate (L/min): 1 L/min  Intake/output summary:  Intake/Output Summary (Last 24 hours) at 09/19/15 2005 Last data filed at 09/19/15 1832  Gross per 24 hour  Intake      0 ml  Output   2500 ml  Net  -2500 ml   LBM:   Baseline Weight: Weight: 85.276 kg (188 lb) Most recent weight: Weight: 85.56 kg (188 lb 10 oz)  Physical Exam:  General: Pt is alert, not in acute distress  HEENT: No icterus, + thrush, oral mucosa moist, c-collar in place, Panda tube in left nostril  Cardiovascular: regular rate and rhythm, S1/S2 No murmur  Respiratory: clear to auscultation bilaterally   Abdomen: Soft, +Bowel sounds, non tender, non distended, no guarding  MSK: No LE edema, cyanosis or clubbing Neurologic: Cranial nerves appear grossly intact. He does not move left side            Additional Data Reviewed: No results for input(s): WBC, HGB, PLT, NA, BUN, CREATININE, ALB in the last 72 hours.   Problem List:  Patient Active Problem List   Diagnosis Date Noted  . E-coli UTI 09/15/2015  . Hypokalemia 09/15/2015  . Bilateral pneumonia 09/15/2015  . Atrial fibrillation ([place]) 09/15/2015  . Acute respiratory failure ([place])   .  Paroxysmal atrial fibrillation ([place]) 09/12/2015  . Hypertensive heart disease 09/12/2015  . Carotid stenosis   . Tobacco use disorder   . HLD (hyperlipidemia)   . Cerebral embolism with cerebral infarction ([place]) 09/07/2015  . Acute right MCA stroke ([place])   . Spinal stenosis in cervical region 09/06/2015  . Spinal stenosis, lumbar region, with neurogenic claudication 09/06/2015     Palliative Care Assessment & Plan    Code Status:  DNR  Goals of Care:  I met with the patient and his son. We reviewed a MOST form and discussed how to develop plan of care that would focus on continuing therapies that would maximize chance of being well enough to return home and limiting therapies not in line with this goal.  We discussed again regarding heroic interventions at the end-of-life. [name] and his family agree this would not be in line with prior expressed wishes for a natural death or be likely to lead to getting well enough to go back home. They were in agreement with changing CODE STATUS to DO NOT RESUSCITATE.  We completed MOST form today. DNR, Limited additional interventions, IVF and ABX if indicated, permanent feeding tube.  Plan for PEG tube placement as soon as tomorrow. After that plan is to go for rehabilitation.  Symptom Management:  Denies symptoms today  Psycho-social/Spiritual:  Desire for further Chaplaincy support:no   Prognosis: Unable to determine Discharge Planning: [place] for rehab with Palliative care service follow-up   Care plan was discussed with patient, his son, and attending physician notified of change in CODE STATUS DO NOT RESUSCITATE via answering service.  Thank you for allowing the Palliative Medicine Team to assist in the care of this patient.   Time In: 1620 Time Out: 1700 Total Time 40 Prolonged Time Billed no    Greater than 50%  of this time was spent counseling and coordinating care related to the above assessment  and plan.   Micheline Rough, MD  09/19/2015, 8:05 PM  Please contact Palliative Medicine Team phone at [phone] for questions and concerns.

## 2015-09-20 DIAGNOSIS — I119 Hypertensive heart disease without heart failure: Secondary | ICD-10-CM

## 2015-09-20 DIAGNOSIS — E785 Hyperlipidemia, unspecified: Secondary | ICD-10-CM

## 2015-09-20 DIAGNOSIS — G8194 Hemiplegia, unspecified affecting left nondominant side: Secondary | ICD-10-CM

## 2015-09-20 LAB — GLUCOSE, CAPILLARY
GLUCOSE-CAPILLARY: 149 mg/dL — AB (ref 65–99)
GLUCOSE-CAPILLARY: 144 mg/dL — AB (ref 65–99)
GLUCOSE-CAPILLARY: 142 mg/dL — AB (ref 65–99)
Glucose-Capillary: 140 mg/dL — ABNORMAL HIGH (ref 65–99)
Glucose-Capillary: 144 mg/dL — ABNORMAL HIGH (ref 65–99)

## 2015-09-20 NOTE — Progress Notes (Addendum)
[facility] Consult Note   [name]  NWG:[id number]  DOB: [birth year]  DOA: 09/06/2015 PCP: Colette Ribas, MD  Brief narrative: [name] is a 81 y.o. male with hypertension, diabetes, hyperlipidemia, PAD. He was admitted for cervical decompression and underwent a laminectomy on 9/19. He was intubated from 9/19 through 9/21 Postoperative he was noted to have dense left-sided hemiparesis and workup revealed a right MCA ischemic infarct. The hospital course was subsequently complicated by atrial fibrillation (RVR on 9/25), aspiration pneumonia , UTI and dysphagia.  Subjective: Continues to be quite sleepy. Awakens. He has no complaints. Specifically, no pain no cough shortness of breath or nausea.  Assessment/Plan: Principal Problem:   Spinal stenosis in cervical & lumbar region -Management per neurosurgery  Active Problems:    Acute right MCA stroke- bilateral carotid artery stenosis -Continues to have no mobility on the left upper and lower extremity and dysphagia -Continue NG tube- -Echo revealed EF of 60-65%-no thrombus -Carotid duplex and MRA of the neck revealing bilateral ICA stenosis of up to 60% at the origins- outpatient vascular surgery follow-up - NOAC recommended to be started on 10/5-in the interim we are to continue aspirin and Plavix- however, will need to hold Plavix for now for PEG tube- d/w Neurology- will start NOAC after PEG placed -Continue statin -LDL 41 -A1c 6.1  Dysphagia -Secondary to CVA, recent C-spine surgery and generalized weakness -Has had an SLP eval including a modified barium swallow which suggests that he is a high risk for aspiration -The patient and family have decided to have a PEG tube placed  A. fib with RVR-paroxysmal -Continue beta blocker-start-NOAC on 10/5 (or after PEG) - cardiology recommends eliquis  Escherichia coli UTI -Sensitive to Bactrim-stop date 10/4  Aspiration pneumonia -Bilateral  consolidation at the lung bases noted on chest x-ray -Levaquin started on 9/25- completed a 7 day course on 10/1  Thrush -Continue Diflucan- stop date 10/4    Tobacco use disorder -Counseled to discontinue    HLD (hyperlipidemia) Continue statin  Diabetes mellitus type 2 -Takes metformin at home -Continue sliding scale insulin   Code Status:     Code Status Orders        Start     Ordered   09/06/15 1522  Full code   Continuous     09/06/15 1521    * Antibiotics: Anti-infectives    Start     Dose/Rate Route Frequency Ordered Stop   09/15/15 1015  fluconazole (DIFLUCAN) tablet 100 mg  Status:  Discontinued     100 mg Oral Daily 09/15/15 1007 09/15/15 1008   09/15/15 1015  fluconazole (DIFLUCAN) tablet 100 mg     100 mg Per Tube Daily 09/15/15 1008     09/15/15 1000  levofloxacin (LEVAQUIN) IVPB 750 mg     750 mg 100 mL/hr over 90 Minutes Intravenous Every 24 hours 09/14/15 1157 09/18/15 1202   09/14/15 1300  sulfamethoxazole-trimethoprim (BACTRIM,SEPTRA) 200-40 MG/5ML suspension 20 mL     20 mL Per Tube Every 12 hours 09/14/15 1201 09/21/15 2359   09/12/15 1000  levofloxacin (LEVAQUIN) IVPB 500 mg  Status:  Discontinued     500 mg 100 mL/hr over 60 Minutes Intravenous Every 24 hours 09/12/15 0954 09/14/15 1157   09/10/15 1800  vancomycin (VANCOCIN) 1,250 mg in sodium chloride 0.9 % 250 mL IVPB  Status:  Discontinued     1,250 mg 166.7 mL/hr over 90 Minutes Intravenous Every 24 hours 09/09/15 2024 09/11/15 1122  09/06/15 1830  vancomycin (VANCOCIN) IVPB 1000 mg/200 mL premix  Status:  Discontinued     1,000 mg 200 mL/hr over 60 Minutes Intravenous Every 12 hours 09/06/15 1534 09/09/15 2024   09/06/15 0715  bacitracin 50,000 Units in sodium chloride irrigation 0.9 % 500 mL irrigation  Status:  Discontinued       As needed 09/06/15 0900 09/06/15 1300   09/06/15 0644  vancomycin (VANCOCIN) IVPB 1000 mg/200 mL premix     1,000 mg 200 mL/hr over 60 Minutes Intravenous  On call to O.R. 09/06/15 0644 09/06/15 0852      Objective: Filed Weights   09/18/15 0500 09/19/15 0522 09/20/15 0446  Weight: 85.5 kg (188 lb 7.9 oz) 85.56 kg (188 lb 10 oz) 83.2 kg (183 lb 6.8 oz)    Intake/Output Summary (Last 24 hours) at 09/20/15 1411 Last data filed at 09/20/15 0800  Gross per 24 hour  Intake      0 ml  Output   2200 ml  Net  -2200 ml     Vitals Filed Vitals:   09/20/15 0442 09/20/15 0443 09/20/15 0446 09/20/15 1026  BP: 136/65   117/48  Pulse: 90   78  Temp: 97.4 F (36.3 C) 98.3 F (36.8 C)  97.9 F (36.6 C)  TempSrc: Oral Axillary  Axillary  Resp: 22   22  Height:      Weight:   83.2 kg (183 lb 6.8 oz)   SpO2: 97%   99%    Exam:  General:  Pt is sleepy but wakable, Oriented x 3 when awake,   not in acute distress  HEENT: No icterus, + thrush and severely dry oral mucosa(mouth breather), c-collar in place, Panda tube in left nostril  Cardiovascular: regular rate and rhythm, S1/S2 No murmur  Respiratory: clear to auscultation bilaterally   Abdomen: Soft, +Bowel sounds, non tender, non distended, no guarding  MSK: No LE edema, cyanosis or clubbing  Neurologic: 0 over 5 strength in left upper and lower extremity, right side 5 over 5, cranial nerves II through XII intact  Data Reviewed: Basic Metabolic Panel:  Recent Labs Lab 09/14/15 0532 09/15/15 0726 09/16/15 0405  NA 137 135 136  K 3.3* 3.4* 4.2  CL 104 102 104  CO2 GLUCOSE 154* 129* 206*  BUN CREATININE 0.74 0.74 0.70  CALCIUM 7.8* 7.9* 8.0*  MG 1.6* 1.6*  --   PHOS 1.8*  --   --    Liver Function Tests:  Recent Labs Lab 09/15/15 0726  AST 33  ALT 23  ALKPHOS 73  BILITOT 1.7*  PROT 5.4*  ALBUMIN 1.9*   No results for input(s): LIPASE, AMYLASE in the last 168 hours. No results for input(s): AMMONIA in the last 168 hours. CBC:  Recent Labs Lab 09/14/15 0532  WBC 7.3  HGB 10.6*  HCT 31.8*  MCV 89.6  PLT 130*   Cardiac Enzymes: No  results for input(s): CKTOTAL, CKMB, CKMBINDEX, TROPONINI in the last 168 hours. BNP (last 3 results) No results for input(s): BNP in the last 8760 hours.  ProBNP (last 3 results) No results for input(s): PROBNP in the last 8760 hours.  CBG:  Recent Labs Lab 09/19/15 1958 09/19/15 2351 09/20/15 0423 09/20/15 0812 09/20/15 1145  GLUCAP 153* 170* 149* 144* 140*    Recent Results (from the past 240 hour(s))  Culture, blood (routine x 2)     Status: None   Collection Time: 09/11/15  10:59 PM  Result Value Ref Range Status   Specimen Description BLOOD LEFT HAND  Final   Special Requests BOTTLES DRAWN AEROBIC AND ANAEROBIC 5CC  Final   Culture NO GROWTH 5 DAYS  Final   Report Status 09/16/2015 FINAL  Final  Culture, blood (routine x 2)     Status: None   Collection Time: 09/11/15 11:09 PM  Result Value Ref Range Status   Specimen Description BLOOD RIGHT HAND  Final   Special Requests IN PEDIATRIC BOTTLE 1CC  Final   Culture NO GROWTH 5 DAYS  Final   Report Status 09/16/2015 FINAL  Final  Culture, Urine     Status: None   Collection Time: 09/11/15 11:54 PM  Result Value Ref Range Status   Specimen Description URINE, RANDOM  Final   Special Requests NONE  Final   Culture 50,000 COLONIES/mL YEAST  Final   Report Status 09/13/2015 FINAL  Final  Culture, Urine     Status: None   Collection Time: 09/12/15 12:45 PM  Result Value Ref Range Status   Specimen Description URINE, CLEAN CATCH  Final   Special Requests Normal  Final   Culture >=100,000 COLONIES/mL ESCHERICHIA COLI  Final   Report Status 09/14/2015 FINAL  Final   Organism ID, Bacteria ESCHERICHIA COLI  Final      Susceptibility   Escherichia coli - MIC*    AMPICILLIN >=32 RESISTANT Resistant     CEFAZOLIN <=4 SENSITIVE Sensitive     CEFTRIAXONE <=1 SENSITIVE Sensitive     CIPROFLOXACIN >=4 RESISTANT Resistant     GENTAMICIN <=1 SENSITIVE Sensitive     IMIPENEM <=0.25 SENSITIVE Sensitive     NITROFURANTOIN <=16  SENSITIVE Sensitive     TRIMETH/SULFA <=20 SENSITIVE Sensitive     AMPICILLIN/SULBACTAM >=32 RESISTANT Resistant     PIP/TAZO <=4 SENSITIVE Sensitive     * >=100,000 COLONIES/mL ESCHERICHIA COLI     Studies: No results found.  Scheduled Meds:  Scheduled Meds: . antiseptic oral rinse  7 mL Mouth Rinse q12n4p  . aspirin EC  81 mg Oral Daily  . chlorhexidine  15 mL Mouth Rinse BID  . feeding supplement (PRO-STAT SUGAR FREE 64)  30 mL Per Tube BID  . fluconazole  100 mg Per Tube Daily  . folic acid  1 mg Oral Daily  . heparin subcutaneous  5,000 Units Subcutaneous 3 times per day  . insulin aspart  0-15 Units Subcutaneous 6 times per day  . metoprolol tartrate  25 mg Oral BID  . pantoprazole (PROTONIX) IV  40 mg Intravenous QHS  . potassium chloride  40 mEq Per Tube Once  . rosuvastatin  10 mg Oral Daily  . sulfamethoxazole-trimethoprim  20 mL Per Tube Q12H   Continuous Infusions: . feeding supplement (JEVITY 1.2 CAL) 1,000 mL (09/20/15 0951)  . sodium chloride 0.9 % 1,000 mL with potassium chloride 40 mEq infusion 75 mL/hr at 09/20/15 0956    Time spent on care of this patient: 35 min   Tawan Corkern, MD 09/20/2015, 2:11 PM  LOS: 14 days   [facility] Office  [phone] Pager - Text Page per [email or web address] If 7PM-7AM, please contact night-coverage [email or web address]

## 2015-09-20 NOTE — Progress Notes (Signed)
I was called by Dr Butler Denmark about patient needing to hold antiplatelet therapy temporarily for 3-5 days for elective peg placement to facilitate nutrition. I agree it would be beneficial to do so with a small but acceptable periprocedural risk of TIA/stroke during this period. Kindly calll for questions.  Delia Heady, MD

## 2015-09-20 NOTE — Progress Notes (Signed)
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  After sx noted pt with significant L hemiparesis with some inattension and R gaze preference.  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 Was able to perform transfers to/from EOB with +2 total assist today. Significant assist required due to active pushing of shoulders/trunk and LE's into extension. Occasionally with statements that were not appropriate to conversation but overall answered orientation questions fairly well. Will continue to follow and progress as able per POC.   Follow Up Recommendations  SNF;Supervision/Assistance - 24 hour     Equipment Recommendations  None recommended by PT    Recommendations for Other Services       Precautions / Restrictions Precautions Precautions: Fall;Cervical;Back Precaution Booklet Issued: No Precaution Comments: pt not retaining education on back precautions.   Required Braces or Orthoses: Cervical Brace Cervical Brace: Hard collar;At all times Restrictions Weight Bearing Restrictions: No    Mobility  Bed Mobility Overal bed mobility: Needs Assistance;+2 for physical assistance Bed Mobility: Rolling;Sidelying to Sit;Sit to Sidelying Rolling: Total assist;+2 for physical assistance Sidelying to sit: Total assist;+2 for physical assistance     Sit to sidelying: Total assist;+2 for physical assistance General bed mobility comments: Hand-over-hand assist for railing use. Pt not able to hold railing for very long/successfully use rail for support. Overall total assist to transition to/from EOB.    Transfers                 General transfer comment: Unable to attempt this session due to significant extension in sitting.  Ambulation/Gait                 Stairs            Wheelchair Mobility    Modified Rankin (Stroke Patients Only) Modified Rankin (Stroke Patients Only) Pre-Morbid Rankin Score: Moderate disability Modified Rankin: Severe disability     Balance Overall balance assessment: Needs assistance Sitting-balance support: Feet supported;Single extremity supported Sitting balance-Leahy Scale: Zero Sitting balance - Comments: Pt able to lean forward actively and hold for a short bout (~1-2 seconds) before actively pushing trunk backwards again. Therapist blocking RLE from extending and aiding in trunk extension, and therapist also providing posterior support at the shoulders. Pt able to lean onto R elbow and prop, however unable to right himself to midline without max assist.  Postural control: Posterior lean                          Cognition Arousal/Alertness: Lethargic Behavior During Therapy: Flat affect Overall Cognitive Status: Impaired/Different from baseline Area of Impairment: Orientation;Attention;Problem solving;Awareness Orientation Level: Disoriented to;Time Current Attention Level: Selective Memory: Decreased recall of precautions;Decreased short-term memory Following Commands: Follows one step commands with increased time   Awareness: Emergent Problem Solving: Slow processing;Decreased initiation;Difficulty sequencing;Requires verbal cues General Comments: L neglect/inattention    Exercises      General Comments        Pertinent Vitals/Pain Pain Assessment: Faces Faces Pain Scale: No hurt    Home Living  Prior Function            PT Goals (current goals can now be found in the care plan section) Acute Rehab PT Goals Patient Stated Goal: Pt did not state goals during  session PT Goal Formulation: With patient Time For Goal Achievement: 09/23/15 Potential to Achieve Goals: Good Progress towards PT goals: Progressing toward goals    Frequency  Min 3X/week    PT Plan Current plan remains appropriate    Co-evaluation PT/OT/SLP Co-Evaluation/Treatment: Yes Reason for Co-Treatment: Complexity of the patient's impairments (multi-system involvement);For patient/therapist safety PT goals addressed during session: Mobility/safety with mobility;Balance       End of Session Equipment Utilized During Treatment: Cervical collar Activity Tolerance: Patient tolerated treatment well Patient left: in bed;with call bell/phone within reach;with bed alarm set     Time: 1610-9604 PT Time Calculation (min) (ACUTE ONLY): 25 min  Charges:  $Therapeutic Activity: 8-22 mins                    G Codes:      Conni Slipper September 24, 2015, 10:43 AM  Conni Slipper, PT, DPT Acute Rehabilitation Services Pager: [phone]

## 2015-09-20 NOTE — Care Management Important Message (Signed)
Important Message  Patient Details  Name: [name] MRN: [id number] Date of Birth: 06-19-1930   Medicare Important Message Given:  Yes-fourth notification given    [name] 09/20/2015, 11:55 AM

## 2015-09-20 NOTE — Progress Notes (Signed)
Overall stable. Awaiting PEG placement. No new developments or problems.

## 2015-09-20 NOTE — Progress Notes (Signed)
IR aware of request for G-tube. Chart reviewed. Currently tolerating TF via distal Panda.  D/w attending MD, would be best to hold Plavix X 5 days for Perc G-tube procedure. Last dose was 10/2. G-tube can be planned for 10/7 as long as pt remains stable. IR team will see pt and prepare for procedure end of the week.  Brayton El PA-C Interventional Radiology 09/20/2015 4:32 PM

## 2015-09-20 NOTE — Clinical Social Work Note (Signed)
Clinical Social Worker continuing to follow patient and family for support and discharge planning needs.  Patient plans to discharge to SNF at [facility] once patient is medically stable for discharge.  Patient plans to have a PEG placed prior to discharge.  CSW will continued to follow patient and family for continued support and to facilitate patient discharge needs once medically stable.   [name], [place] [phone]

## 2015-09-20 NOTE — Progress Notes (Signed)
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  After sx noted pt with significant L hemiparesis with some inattension and R gaze preference.  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 Pt answering most orientation questions appropriately, however; stating month was September, that is was Tuesday, and that is was 2000. Pt able to state where he was and why he was in the hospital, pt oriented to self. Pt confabulating on using cane to drive an airplane. Pt sat EOB and required total assist +2 to maintain sitting balance, extending backwards and pushing through BLEs causing trunk extension.    Follow Up Recommendations  SNF;Supervision/Assistance - 24 hour    Equipment Recommendations  Other (comment) (TBD at SNF)    Recommendations for Other Services  None at this time   Precautions / Restrictions Precautions Precautions: Fall;Cervical;Back Precaution Booklet Issued: No Precaution Comments: pt not retaining education on back precautions.   Required Braces or Orthoses: Cervical Brace Cervical Brace: Hard collar;At all times Restrictions Weight Bearing Restrictions: No    Mobility Bed Mobility Overal bed mobility: Needs Assistance;+2 for physical assistance Bed Mobility: Rolling;Sidelying to Sit;Sit to Sidelying Rolling: Total assist;+2 for physical assistance Sidelying to sit: Total assist;+2 for physical assistance     Sit to sidelying: Total assist;+2 for physical assistance General bed mobility comments: Hand-over-hand assist for railing use. Pt not able to hold  railing for very long/successfully use rail for support. Overall total assist to transition to/from EOB.   Transfers General transfer comment: Unable to attempt this session due to significant extension in sitting.    Balance Overall balance assessment: Needs assistance Sitting-balance support: Feet supported;Single extremity supported Sitting balance-Leahy Scale: Zero Sitting balance - Comments: Pt able to lean forward actively and hold for a short bout (~1-2 seconds) before actively pushing trunk backwards again. Therapist blocking RLE from extending and aiding in trunk extension, and therapist also providing posterior support at the shoulders. Pt able to lean onto R elbow and prop, however unable to right himself to midline without max assist.  Postural control: Posterior lean    ADL Overall ADL's : Needs assistance/impaired Eating/Feeding: NPO (with NG tube)   Grooming: Moderate assistance;Wash/dry face Grooming Details (indicate cue type and reason): hand over hand assistance needed to complete washing of face using wash cloth General ADL Comments: Recommending bed level ADLs at this time. Pt requires up to total assist +2 for bathing & dressing tasks. Pt sat EOB for ~10 minutes and required total assist to maintain static sitting EOB. Multiple/constant cueing required for pt to sit up straight, as pt kept pushing himself backwards onto therapist. Pt pushing through BLEs during sitting and throwing trunk and LEs into extension. Assisted pt with grooming task of washing face while pt in supine. Pt with undershooting when reaching for washcloth. Pt with poor RUE control when attempting to wash face;hand over hand required.       Vision Additional Comments: left field cut   Perception Perception Perception Tested?: Yes Perception Deficits: Inattention/neglect Spatial deficits: Pt continues to present with dysmetric movements. When asking pt  to look towards left where therapist is, pt  looking to left side and able to focus on therapist; stating color therapist is wearing and color of therapists hair. From previous notes, this seems to be better than ~1 week ago. Will continue to assess.    Praxis Praxis Praxis tested?: Deficits Deficits: Initiation;Motor Impersistence    Cognition   Behavior During Therapy: Flat affect Overall Cognitive Status: Impaired/Different from baseline Area of Impairment: Orientation;Attention;Problem solving;Awareness;Safety/judgement Orientation Level: Disoriented to;Time Current Attention Level: Selective Memory: Decreased recall of precautions;Decreased short-term memory  Following Commands: Follows one step commands with increased time Safety/Judgement: Decreased awareness of safety;Decreased awareness of deficits Awareness: Emergent Problem Solving: Slow processing;Decreased initiation;Difficulty sequencing;Requires verbal cues General Comments: L neglect/inattention. Pt asking for his cane so he can drive the airplane.                  Pertinent Vitals/ Pain       Pain Assessment: Faces Faces Pain Scale: No hurt   Frequency Min 2X/week     Progress Toward Goals  OT Goals(current goals can now befound in the care plan section)  Progress towards OT goals: Progressing toward goals  Acute Rehab OT Goals Patient Stated Goal: Pt did not state goals during session  Plan Discharge plan remains appropriate    Co-evaluation    PT/OT/SLP Co-Evaluation/Treatment: Yes Reason for Co-Treatment: Complexity of the patient's impairments (multi-system involvement);For patient/therapist safety PT goals addressed during session: Mobility/safety with mobility;Balance OT goals addressed during session: ADL's and self-care;Strengthening/ROM      End of Session Equipment Utilized During Treatment: Cervical collar   Activity Tolerance Patient tolerated treatment well   Patient Left in bed;with bed alarm set;with SCD's reapplied      Time: 4540-9811 OT Time Calculation (min): 24 min  Charges: OT General Charges $OT Visit: 1 Procedure OT Treatments $Self Care/Home Management : 8-22 mins  Consandra Laske , MS, OTR/L, CLT Pager: [phone]  09/20/2015, 11:09 AM

## 2015-09-21 DIAGNOSIS — I6529 Occlusion and stenosis of unspecified carotid artery: Secondary | ICD-10-CM

## 2015-09-21 LAB — GLUCOSE, CAPILLARY
GLUCOSE-CAPILLARY: 113 mg/dL — AB (ref 65–99)
GLUCOSE-CAPILLARY: 154 mg/dL — AB (ref 65–99)
GLUCOSE-CAPILLARY: 122 mg/dL — AB (ref 65–99)
GLUCOSE-CAPILLARY: 146 mg/dL — AB (ref 65–99)
Glucose-Capillary: 163 mg/dL — ABNORMAL HIGH (ref 65–99)
Glucose-Capillary: 166 mg/dL — ABNORMAL HIGH (ref 65–99)

## 2015-09-21 NOTE — Progress Notes (Signed)
I stopped by to see [name] today.    He denied complaints or needs at this time.    Plan to stop daily rounding at this time, but our team would be happy to reengage if we can be of further assistance in the care of [name] moving forward.    Please let us know if we can be of further assistance in the care of [name].  Romie Minus, MD [facility] Palliative Medicine Team [phone]

## 2015-09-21 NOTE — Progress Notes (Signed)
[facility] consultation follow-up progress note  PATIENT DETAILS Name: [name] Age: 81 y.o. Sex: male Date of Birth: 15-Jul-1930 Admit Date: 09/06/2015 Admitting Physician Julio Sicks, MD RUE:[id number], Chancy Hurter, MD  Brief narrative:  [name] is a 81 y.o. male with history of HTN, DM, hypercholesterolemia, PAD, GERD, who presented to [name] on 9/19 for surgery to remedy progressive bilateral upper and lower extremity numbness from critical cervical and lumbar stenosis. Patient underwent anterior cervical decompression and lumbar decompression with laminectomy on 9/19. Postoperatively, patient remainrf on the ventilator in the intensive care unit. Unfortunately patient was noted to have dense left-sided hemiparesis, and also some concern for angioedema. Further workup demonstrated acute ischemia in the right MCA territory. Hospital course was further complicated by development of atrial fibrillation, presumed aspiration pneumonia and UTI. He unfortunately also developed significant dysphagia, and remains nothing by mouth with a panda tube in place. Patient was subsequently transferred to the medical surgical unit on 9/29, and [facility] was consulted to manage patient's ongoing medical issues.   Significant events during this hospital stay: 9/19 >>to OR for C2-C3 and C4-5 anterior anterior cervical discectomy with interbody fusion, anterior instrumentation at C2-3 and C4-5, L3-4 decompressive laminectomy with bilateral L3 and l4 foraminotomies. Returned to ICU on vent. 9/19>>Noted to have left hemiparesis-CT head -Suggestive of rt MCA infarct. 9/19 >> 9/21 OETT  9/25>> afib with rvr cards consult  Subjective: Awake, alert. Follows commands. Still with neglect of the left side. Panda tube in place.  Assessment/Plan: Principal Problem: Spinal stenosis in cervical/Lumbar region:s/p  C2-C3 and C4-5 anterior anterior cervical discectomy with  interbody fusion, anterior instrumentation at C2-3 and C4-5, L3-4 decompressive laminectomy with bilateral L3 and l4 foraminotomies.Cervical collar remains in place. Neurosurgery following  Active Problems: Acute Large right MCA CVA with scattered right PCA as well as punctate left MCA and right SCA : Continues to have dense left hemiparesis and hemi-neglect. Echo showed EF around 60-65%, carotid Doppler showed Bilateral ICA origin stenosis of up to 60% in the neck. Given atrial fibrillation, will need to start NOAC's-neurology recommending to wait 2 weeks before initiating anticoagulation as significant risk for hemorrhagic transition. Tentative NOAC start date 10/5. Previously on both aspirin and Plavix, however since peg tube being planned, Plavix discontinued and now only on aspirin.  Patient will need outpatient vascular surgery follow-up for carotid artery Doppler monitoring and CEA as indicated. Note LDL 41 (goal<70), A1c 6.1  Atrial fibrillation with RVR: Rate controlled with beta blocker. See above regarding anticoagulation. Cardiology following  Escherichia coli UTI: Afebrile, no leukocytosis. Continue Bactrim-stop date 10/4  Presumed aspiration pneumonia: Afebrile, no leukocytosis, completed course of Levaquin  Oral thrush:Continue diflucan-stop date 10/4   Dysphagia: Multifactorial-suspect predominantly secondary to cervical surgery/hardware position-with contributions critical illness, and possible CVA. Has had numerous SLP evaluation including MBS-recommendations are to continue to remain nothing by mouth. Plans are to place a PEG tube later this week. Family and patient both aware that he will continue to remain at aspiration risk with or without PEG tube.  Hypokalemia: Repleted, will require periodic monitoring.  Dyslipidemia: Continue statin-LDL 41 (goal<70)  Type 2 diabetes: CBGs stable, continue suicide. Resume metformin on discharge. A1c 6.1  Hypertension:  Controlled-continue metoprolol. Follow and adjust accordingly  Bilateral carotid artery stenosis: See above. Ffollow-up with vascular surgery on discharge  Tobacco abuse disorder: Will need ongoing counseling.  Disposition: Remain inpatient-SNF  eventually-suspect next week. Issue of comfort feeds vs peg tube will need to addressed prior to discharge.  Antimicrobial agents  See below  Anti-infectives    Start     Dose/Rate Route Frequency Ordered Stop   09/15/15 1015  fluconazole (DIFLUCAN) tablet 100 mg  Status:  Discontinued     100 mg Oral Daily 09/15/15 1007 09/15/15 1008   09/15/15 1015  fluconazole (DIFLUCAN) tablet 100 mg     100 mg Per Tube Daily 09/15/15 1008     09/15/15 1000  levofloxacin (LEVAQUIN) IVPB 750 mg     750 mg 100 mL/hr over 90 Minutes Intravenous Every 24 hours 09/14/15 1157 09/18/15 1202   09/14/15 1300  sulfamethoxazole-trimethoprim (BACTRIM,SEPTRA) 200-40 MG/5ML suspension 20 mL     20 mL Per Tube Every 12 hours 09/14/15 1201 09/21/15 2359   09/12/15 1000  levofloxacin (LEVAQUIN) IVPB 500 mg  Status:  Discontinued     500 mg 100 mL/hr over 60 Minutes Intravenous Every 24 hours 09/12/15 0954 09/14/15 1157   09/10/15 1800  vancomycin (VANCOCIN) 1,250 mg in sodium chloride 0.9 % 250 mL IVPB  Status:  Discontinued     1,250 mg 166.7 mL/hr over 90 Minutes Intravenous Every 24 hours 09/09/15 2024 09/11/15 1122   09/06/15 1830  vancomycin (VANCOCIN) IVPB 1000 mg/200 mL premix  Status:  Discontinued     1,000 mg 200 mL/hr over 60 Minutes Intravenous Every 12 hours 09/06/15 1534 09/09/15 2024   09/06/15 0715  bacitracin 50,000 Units in sodium chloride irrigation 0.9 % 500 mL irrigation  Status:  Discontinued       As needed 09/06/15 0900 09/06/15 1300   09/06/15 0644  vancomycin (VANCOCIN) IVPB 1000 mg/200 mL premix     1,000 mg 200 mL/hr over 60 Minutes Intravenous On call to O.R. 09/06/15 1610 09/06/15 9604      DVT Prophylaxis: Prophylactic Heparin    Code Status: Full code   Family Communication None at bedside  Procedures: 9/19 >>to OR for C2-C3 and C4-5 anterior anterior cervical discectomy with interbody fusion, anterior instrumentation at C2-3 and C4-5, L3-4 decompressive laminectomy with bilateral L3 and l4 foraminotomies. 9/19 >> 9/21 OETT   CONSULTS:  cardiology, pulmonary/intensive care and Palliative  Time spent 30 minutes-Greater than 50% of this time was spent in counseling, explanation of diagnosis, planning of further management, and coordination of care.  MEDICATIONS: Scheduled Meds: . antiseptic oral rinse  7 mL Mouth Rinse q12n4p  . aspirin EC  81 mg Oral Daily  . chlorhexidine  15 mL Mouth Rinse BID  . feeding supplement (PRO-STAT SUGAR FREE 64)  30 mL Per Tube BID  . fluconazole  100 mg Per Tube Daily  . folic acid  1 mg Oral Daily  . heparin subcutaneous  5,000 Units Subcutaneous 3 times per day  . insulin aspart  0-15 Units Subcutaneous 6 times per day  . metoprolol tartrate  25 mg Oral BID  . pantoprazole (PROTONIX) IV  40 mg Intravenous QHS  . potassium chloride  40 mEq Per Tube Once  . rosuvastatin  10 mg Oral Daily  . sulfamethoxazole-trimethoprim  20 mL Per Tube Q12H   Continuous Infusions: . feeding supplement (JEVITY 1.2 CAL) 1,000 mL (09/21/15 0250)  . sodium chloride 0.9 % 1,000 mL with potassium chloride 40 mEq infusion 75 mL/hr at 09/21/15 1225   PRN Meds:.acetaminophen **OR** acetaminophen, cyclobenzaprine, iohexol, iohexol, loperamide, menthol-cetylpyridinium **OR** phenol, metoprolol, midazolam, midazolam, ondansetron (ZOFRAN) IV    PHYSICAL EXAM: Vital  signs in last 24 hours: Filed Vitals:   09/21/15 0133 09/21/15 0500 09/21/15 0533 09/21/15 1000  BP: 116/60  123/71 135/50  Pulse: 84  96 105  Temp: 98.4 F (36.9 C)  98.2 F (36.8 C) 98.1 F (36.7 C)  TempSrc: Oral  Oral Oral  Resp: 20  20 18   Height:      Weight:  80.1 kg (176 lb 9.4 oz)    SpO2: 95%  97% 98%     Weight change: -3.1 kg (-6 lb 13.3 oz) Filed Weights   09/19/15 0522 09/20/15 0446 09/21/15 0500  Weight: 85.56 kg (188 lb 10 oz) 83.2 kg (183 lb 6.8 oz) 80.1 kg (176 lb 9.4 oz)   Body mass index is 23.3 kg/(m^2).   Gen Exam: Awake and alert-+ dysarthria. Panda tube in place, cervical collar in place Chest: B/L Clear anteriorly CVS: S1 S2 Regular Abdomen: soft, BS +, non tender, non distended.  Extremities: no edema, lower extremities warm to touch. Neurologic: Dense left hemiparesis-with left hemi-neglect. Skin: No Rash.   Wounds: N/A.    Intake/Output from previous day:  Intake/Output Summary (Last 24 hours) at 09/21/15 1355 Last data filed at 09/21/15 1209  Gross per 24 hour  Intake  877.5 ml  Output   1600 ml  Net -722.5 ml     LAB RESULTS: CBC No results for input(s): WBC, HGB, HCT, PLT, MCV, MCH, MCHC, RDW, LYMPHSABS, MONOABS, EOSABS, BASOSABS, BANDABS in the last 168 hours.  Invalid input(s): NEUTRABS, BANDSABD  Chemistries   Recent Labs Lab 09/15/15 0726 09/16/15 0405  NA 135 136  K 3.4* 4.2  CL 102 104  CO2 27 25  GLUCOSE 129* 206*  BUN 14 16  CREATININE 0.74 0.70  CALCIUM 7.9* 8.0*  MG 1.6*  --     CBG:  Recent Labs Lab 09/20/15 1946 09/21/15 09/21/15 0430 09/21/15 0846 09/21/15 1202  GLUCAP 142* 163* 113* 154* 122*    GFR Estimated Creatinine Clearance: 79.1 mL/min (by C-G formula based on Cr of 0.7).  Coagulation profile No results for input(s): INR, PROTIME in the last 168 hours.  Cardiac Enzymes No results for input(s): CKMB, TROPONINI, MYOGLOBIN in the last 168 hours.  Invalid input(s): CK  Invalid input(s): POCBNP No results for input(s): DDIMER in the last 72 hours. No results for input(s): HGBA1C in the last 72 hours. No results for input(s): CHOL, HDL, LDLCALC, TRIG, CHOLHDL, LDLDIRECT in the last 72 hours. No results for input(s): TSH, T4TOTAL, T3FREE, THYROIDAB in the last 72 hours.  Invalid input(s): FREET3 No  results for input(s): VITAMINB12, FOLATE, FERRITIN, TIBC, IRON, RETICCTPCT in the last 72 hours. No results for input(s): LIPASE, AMYLASE in the last 72 hours.  Urine Studies No results for input(s): UHGB, CRYS in the last 72 hours.  Invalid input(s): UACOL, UAPR, USPG, UPH, UTP, UGL, UKET, UBIL, UNIT, UROB, ULEU, UEPI, UWBC, URBC, UBAC, CAST, UCOM, BILUA  MICROBIOLOGY: Recent Results (from the past 240 hour(s))  Culture, blood (routine x 2)     Status: None   Collection Time: 09/11/15 10:59 PM  Result Value Ref Range Status   Specimen Description BLOOD LEFT HAND  Final   Special Requests BOTTLES DRAWN AEROBIC AND ANAEROBIC 5CC  Final   Culture NO GROWTH 5 DAYS  Final   Report Status 09/16/2015 FINAL  Final  Culture, blood (routine x 2)     Status: None   Collection Time: 09/11/15 11:09 PM  Result Value Ref Range Status   Specimen  Description BLOOD RIGHT HAND  Final   Special Requests IN PEDIATRIC BOTTLE 1CC  Final   Culture NO GROWTH 5 DAYS  Final   Report Status 09/16/2015 FINAL  Final  Culture, Urine     Status: None   Collection Time: 09/11/15 11:54 PM  Result Value Ref Range Status   Specimen Description URINE, RANDOM  Final   Special Requests NONE  Final   Culture 50,000 COLONIES/mL YEAST  Final   Report Status 09/13/2015 FINAL  Final  Culture, Urine     Status: None   Collection Time: 09/12/15 12:45 PM  Result Value Ref Range Status   Specimen Description URINE, CLEAN CATCH  Final   Special Requests Normal  Final   Culture >=100,000 COLONIES/mL ESCHERICHIA COLI  Final   Report Status 09/14/2015 FINAL  Final   Organism ID, Bacteria ESCHERICHIA COLI  Final      Susceptibility   Escherichia coli - MIC*    AMPICILLIN >=32 RESISTANT Resistant     CEFAZOLIN <=4 SENSITIVE Sensitive     CEFTRIAXONE <=1 SENSITIVE Sensitive     CIPROFLOXACIN >=4 RESISTANT Resistant     GENTAMICIN <=1 SENSITIVE Sensitive     IMIPENEM <=0.25 SENSITIVE Sensitive     NITROFURANTOIN <=16  SENSITIVE Sensitive     TRIMETH/SULFA <=20 SENSITIVE Sensitive     AMPICILLIN/SULBACTAM >=32 RESISTANT Resistant     PIP/TAZO <=4 SENSITIVE Sensitive     * >=100,000 COLONIES/mL ESCHERICHIA COLI    RADIOLOGY STUDIES/RESULTS: Dg Cervical Spine 1 View  09/06/2015   CLINICAL DATA:  ACDF C2-3 and C4-5  EXAM: DG C-ARM 61-120 MIN; DG CERVICAL SPINE - 1 VIEW  COMPARISON:  None.  FLUOROSCOPY TIME:  10 seconds  One image  FINDINGS: Single lateral intraoperative fluoroscopic image is provided of the cervical spine. Anterior cervical fusion with interbody spacer device at C2-3 and C4-5.  IMPRESSION: ACDF C2-3 and C4-5.   Electronically Signed   By: Elige Ko   On: 09/06/2015 12:43   Dg Abd 1 View  09/15/2015   CLINICAL DATA:  Feeding tube placement  EXAM: ABDOMEN - 1 VIEW  COMPARISON:  09/13/2015  TECHNIQUE: Twelve French feeding tube placed under fluoroscopy by Delice Bison Dingus RT-R.  20 mL of Omnipaque 300 was utilized to confirm placement.  FLUOROSCOPY TIME:  5 minutes 12 seconds  FINDINGS: Contrast opacifies second third portions the duodenum as well as RIGHT proximal jejunal loop.  Feeding tube is placed with the tip at/beyond ligament of Treitz.  IMPRESSION: Placement of feeding tube with tip at/beyond ligament of Treitz.   Electronically Signed   By: Ulyses Southward M.D.   On: 09/15/2015 14:49   Dg Abd 1 View  09/10/2015   CLINICAL DATA:  Nasoenteric feeding tube placement by RT  EXAM: ABDOMEN - 1 VIEW  COMPARISON:  06/15/2006  FINDINGS: Feeding tube extends to the ligament of Treitz, confirmed with contrast injection.  IMPRESSION: Feeding tube placement to ligament of Treitz.   Electronically Signed   By: Corlis Leak M.D.   On: 09/10/2015 13:26   Ct Head Wo Contrast  09/06/2015   CLINICAL DATA:  Left-sided weakness following cervical surgery  EXAM: CT HEAD WITHOUT CONTRAST  TECHNIQUE: Contiguous axial images were obtained from the base of the skull through the vertex without intravenous contrast.   COMPARISON:  06/27/2013  FINDINGS: The bony calvarium is intact. No gross soft tissue abnormality is seen. Mild atrophic changes are noted. There is some generalized decreased attenuation identified in the  distribution of the right middle cerebral artery suggestive of a early ischemic event. MRI may be helpful further evaluation. No other focal areas of ischemia or hemorrhage are noted.  IMPRESSION: Chronic atrophic changes.  Generalized decreased attenuation in the distribution of the right middle cerebral artery suggestive of early ischemia. MRI may be helpful for further evaluation.  These results were called by telephone at the time of interpretation on 09/06/2015 at 5:34 pm to [facility], the pts nurse, who verbally acknowledged these results.   Electronically Signed   By: Alcide Clever M.D.   On: 09/06/2015 17:37   [name] Wo Contrast  09/08/2015   CLINICAL DATA:  81 year old male with left side weakness following cervical spine and lumbar surgery. Initial encounter.  EXAM: MRI HEAD WITHOUT CONTRAST AND WITH CONTRAST  MRA HEAD WITHOUT CONTRAST  MRA NECK WITHOUT AND WITH CONTRAST  TECHNIQUE: Multiplanar, multiecho pulse sequences of the brain and surrounding structures were obtained without intravenous contrast. Angiographic images of the Circle of Willis were obtained using MRA technique without intravenous contrast. Angiographic images of the neck were obtained using MRA technique without and with intravenous contrast. Carotid stenosis measurements (when applicable) are obtained utilizing NASCET criteria, using the distal internal carotid diameter as the denominator.  CONTRAST:  20mL MULTIHANCE GADOBENATE DIMEGLUMINE 529 MG/ML IV SOLN  COMPARISON:  Head CT without contrast 09/06/2015. Preoperative Cervical spine MRI 08/12/2015  FINDINGS: MRI HEAD FINDINGS  Confluent restricted diffusion throughout most of the right hemisphere. Near complete involvement of the right MCA territory. Right MCA/PCA watershed  involvement extending to the lateral occipital pole. Scattered involvement elsewhere in the right PCA territory. Some of the right temporal lobe also is spared.  Scattered small areas of restricted diffusion also in the left MCA territory, an the right superior cerebellar artery territory.  Major intracranial vascular flow voids are preserved, see MRA findings below.  Confluent cytotoxic edema in the right hemisphere. Areas of petechial hemorrhage in the anterior and posterior right MCA territories. Mass effect on the right lateral ventricle. Mild leftward midline shift of 3 mm. No ventriculomegaly. Basilar cisterns are patent. No extra-axial or intraventricular hemorrhage. Following contrast, no significant post ischemic enhancement.  Minimal other nonspecific cerebral white matter T2 and FLAIR hyperintensity. There is a small chronic lacunar infarct in the right PICA territory (series 6, image 5).  Visible internal auditory structures appear normal. Trace mastoid fluid. Mild paranasal sinus mucosal thickening. The patient is intubated. Trace fluid in the oral cavity. Postoperative changes to the globes. Negative scalp soft tissues.  Hardware susceptibility artifact in the cervical spine. Chronic spinal Stenosis with mass effect on the spinal cord at C2-C3 (series 3, image 13).  MRA NECK FINDINGS  Precontrast time-of-flight imaging. A antegrade flow in both carotid and vertebral arteries in the neck and to the skullbase.  Post-contrast neck MRA images. Three vessel arch configuration. No great vessel origin stenosis.  Negative right CCA proximal to the right carotid bifurcation. At the right ICA origin there is irregularity and stenosis of up to 60 % with respect to the distal vessel (series 1302, image 84 and series 04540, image 5). The cervical right ICA is patent and otherwise appears negative to the skullbase.  Negative left CCA. Irregularity and stenosis at the left ICA origin also measuring up to 60 % with  respect to the distal vessel and best seen on series 1302, image 71. The left ICA remains patent and is otherwise negative to the skullbase.  No proximal subclavian  artery stenosis. Normal left vertebral artery origin. Mild if any stenosis at the right vertebral artery origin. The left vertebral artery is mildly dominant throughout.  MRA HEAD FINDINGS  Antegrade flow in the posterior circulation. Dominant distal left vertebral artery. Normal right PICA origin. Dominant appearing left AICA with a patent origin. Patent vertebrobasilar junction. No basilar stenosis. Normal SCA and PCA origins, fetal type on the right. Both posterior communicating arteries are present. Bilateral PCA branches are within normal limits.  Antegrade flow in both ICA siphons. No siphon stenosis. Ophthalmic and posterior communicating artery origins are within normal limits. Small probable atherosclerotic pseudo lesion of the left ICA siphon in the superior hypophyseal region (series 505, image 11). Patent carotid termini. Normal MCA and ACA origins.  Anterior communicating artery and visualized ACA branches are within normal limits. Left MCA M1 segment and bifurcation are within normal limits. No left MCA branch occlusion identified. Right MCA M1 segment and bifurcation are within normal limits. No major right MCA branch occlusion identified.  IMPRESSION: 1. 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 2. Scattered small left MCA and occasional right SCA acute infarcts. 3. Bilateral ICA origin stenosis of up to 60% in the neck. No emergent large vessel occlusion. No intracranial stenosis or MCA branch occlusion identified. 4. Chronic spinal stenosis with spinal cord mass effect at C2-C3. New upper cervical fusion hardware.   Electronically Signed   By: Odessa Fleming M.D.   On: 09/08/2015 10:20   Aaron Angiogram Neck W Wo  Contrast  09/08/2015   CLINICAL DATA:  81 year old male with left side weakness following cervical spine and lumbar surgery. Initial encounter.  EXAM: MRI HEAD WITHOUT CONTRAST AND WITH CONTRAST  MRA HEAD WITHOUT CONTRAST  MRA NECK WITHOUT AND WITH CONTRAST  TECHNIQUE: Multiplanar, multiecho pulse sequences of the brain and surrounding structures were obtained without intravenous contrast. Angiographic images of the Circle of Willis were obtained using MRA technique without intravenous contrast. Angiographic images of the neck were obtained using MRA technique without and with intravenous contrast. Carotid stenosis measurements (when applicable) are obtained utilizing NASCET criteria, using the distal internal carotid diameter as the denominator.  CONTRAST:  20mL MULTIHANCE GADOBENATE DIMEGLUMINE 529 MG/ML IV SOLN  COMPARISON:  Head CT without contrast 09/06/2015. Preoperative Cervical spine MRI 08/12/2015  FINDINGS: MRI HEAD FINDINGS  Confluent restricted diffusion throughout most of the right hemisphere. Near complete involvement of the right MCA territory. Right MCA/PCA watershed involvement extending to the lateral occipital pole. Scattered involvement elsewhere in the right PCA territory. Some of the right temporal lobe also is spared.  Scattered small areas of restricted diffusion also in the left MCA territory, an the right superior cerebellar artery territory.  Major intracranial vascular flow voids are preserved, see MRA findings below.  Confluent cytotoxic edema in the right hemisphere. Areas of petechial hemorrhage in the anterior and posterior right MCA territories. Mass effect on the right lateral ventricle. Mild leftward midline shift of 3 mm. No ventriculomegaly. Basilar cisterns are patent. No extra-axial or intraventricular hemorrhage. Following contrast, no significant post ischemic enhancement.  Minimal other nonspecific cerebral white matter T2 and FLAIR hyperintensity. There is a small chronic  lacunar infarct in the right PICA territory (series 6, image 5).  Visible internal auditory structures appear normal. Trace mastoid fluid. Mild paranasal sinus mucosal thickening. The patient is intubated. Trace fluid in the oral cavity. Postoperative  changes to the globes. Negative scalp soft tissues.  Hardware susceptibility artifact in the cervical spine. Chronic spinal Stenosis with mass effect on the spinal cord at C2-C3 (series 3, image 13).  MRA NECK FINDINGS  Precontrast time-of-flight imaging. A antegrade flow in both carotid and vertebral arteries in the neck and to the skullbase.  Post-contrast neck MRA images. Three vessel arch configuration. No great vessel origin stenosis.  Negative right CCA proximal to the right carotid bifurcation. At the right ICA origin there is irregularity and stenosis of up to 60 % with respect to the distal vessel (series 1302, image 84 and series 01027, image 5). The cervical right ICA is patent and otherwise appears negative to the skullbase.  Negative left CCA. Irregularity and stenosis at the left ICA origin also measuring up to 60 % with respect to the distal vessel and best seen on series 1302, image 71. The left ICA remains patent and is otherwise negative to the skullbase.  No proximal subclavian artery stenosis. Normal left vertebral artery origin. Mild if any stenosis at the right vertebral artery origin. The left vertebral artery is mildly dominant throughout.  MRA HEAD FINDINGS  Antegrade flow in the posterior circulation. Dominant distal left vertebral artery. Normal right PICA origin. Dominant appearing left AICA with a patent origin. Patent vertebrobasilar junction. No basilar stenosis. Normal SCA and PCA origins, fetal type on the right. Both posterior communicating arteries are present. Bilateral PCA branches are within normal limits.  Antegrade flow in both ICA siphons. No siphon stenosis. Ophthalmic and posterior communicating artery origins are within  normal limits. Small probable atherosclerotic pseudo lesion of the left ICA siphon in the superior hypophyseal region (series 505, image 11). Patent carotid termini. Normal MCA and ACA origins.  Anterior communicating artery and visualized ACA branches are within normal limits. Left MCA M1 segment and bifurcation are within normal limits. No left MCA branch occlusion identified. Right MCA M1 segment and bifurcation are within normal limits. No major right MCA branch occlusion identified.  IMPRESSION: 1. 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 2. Scattered small left MCA and occasional right SCA acute infarcts. 3. Bilateral ICA origin stenosis of up to 60% in the neck. No emergent large vessel occlusion. No intracranial stenosis or MCA branch occlusion identified. 4. Chronic spinal stenosis with spinal cord mass effect at C2-C3. New upper cervical fusion hardware.   Electronically Signed   By: Odessa Fleming M.D.   On: 09/08/2015 10:20   Aaron Laqueta Jean OZ Contrast  09/08/2015   CLINICAL DATA:  81 year old male with left side weakness following cervical spine and lumbar surgery. Initial encounter.  EXAM: MRI HEAD WITHOUT CONTRAST AND WITH CONTRAST  MRA HEAD WITHOUT CONTRAST  MRA NECK WITHOUT AND WITH CONTRAST  TECHNIQUE: Multiplanar, multiecho pulse sequences of the brain and surrounding structures were obtained without intravenous contrast. Angiographic images of the Circle of Willis were obtained using MRA technique without intravenous contrast. Angiographic images of the neck were obtained using MRA technique without and with intravenous contrast. Carotid stenosis measurements (when applicable) are obtained utilizing NASCET criteria, using the distal internal carotid diameter as the denominator.  CONTRAST:  20mL MULTIHANCE GADOBENATE DIMEGLUMINE 529 MG/ML IV SOLN  COMPARISON:  Head CT without  contrast 09/06/2015. Preoperative Cervical spine MRI 08/12/2015  FINDINGS: MRI HEAD FINDINGS  Confluent restricted diffusion throughout most of the right hemisphere. Near complete  involvement of the right MCA territory. Right MCA/PCA watershed involvement extending to the lateral occipital pole. Scattered involvement elsewhere in the right PCA territory. Some of the right temporal lobe also is spared.  Scattered small areas of restricted diffusion also in the left MCA territory, an the right superior cerebellar artery territory.  Major intracranial vascular flow voids are preserved, see MRA findings below.  Confluent cytotoxic edema in the right hemisphere. Areas of petechial hemorrhage in the anterior and posterior right MCA territories. Mass effect on the right lateral ventricle. Mild leftward midline shift of 3 mm. No ventriculomegaly. Basilar cisterns are patent. No extra-axial or intraventricular hemorrhage. Following contrast, no significant post ischemic enhancement.  Minimal other nonspecific cerebral white matter T2 and FLAIR hyperintensity. There is a small chronic lacunar infarct in the right PICA territory (series 6, image 5).  Visible internal auditory structures appear normal. Trace mastoid fluid. Mild paranasal sinus mucosal thickening. The patient is intubated. Trace fluid in the oral cavity. Postoperative changes to the globes. Negative scalp soft tissues.  Hardware susceptibility artifact in the cervical spine. Chronic spinal Stenosis with mass effect on the spinal cord at C2-C3 (series 3, image 13).  MRA NECK FINDINGS  Precontrast time-of-flight imaging. A antegrade flow in both carotid and vertebral arteries in the neck and to the skullbase.  Post-contrast neck MRA images. Three vessel arch configuration. No great vessel origin stenosis.  Negative right CCA proximal to the right carotid bifurcation. At the right ICA origin there is irregularity and stenosis of up to 60 % with respect to the  distal vessel (series 1302, image 84 and series 16109, image 5). The cervical right ICA is patent and otherwise appears negative to the skullbase.  Negative left CCA. Irregularity and stenosis at the left ICA origin also measuring up to 60 % with respect to the distal vessel and best seen on series 1302, image 71. The left ICA remains patent and is otherwise negative to the skullbase.  No proximal subclavian artery stenosis. Normal left vertebral artery origin. Mild if any stenosis at the right vertebral artery origin. The left vertebral artery is mildly dominant throughout.  MRA HEAD FINDINGS  Antegrade flow in the posterior circulation. Dominant distal left vertebral artery. Normal right PICA origin. Dominant appearing left AICA with a patent origin. Patent vertebrobasilar junction. No basilar stenosis. Normal SCA and PCA origins, fetal type on the right. Both posterior communicating arteries are present. Bilateral PCA branches are within normal limits.  Antegrade flow in both ICA siphons. No siphon stenosis. Ophthalmic and posterior communicating artery origins are within normal limits. Small probable atherosclerotic pseudo lesion of the left ICA siphon in the superior hypophyseal region (series 505, image 11). Patent carotid termini. Normal MCA and ACA origins.  Anterior communicating artery and visualized ACA branches are within normal limits. Left MCA M1 segment and bifurcation are within normal limits. No left MCA branch occlusion identified. Right MCA M1 segment and bifurcation are within normal limits. No major right MCA branch occlusion identified.  IMPRESSION: 1. 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 2. Scattered small left MCA and occasional right SCA acute infarcts. 3. Bilateral ICA origin stenosis of up to 60% in the neck. No emergent large vessel occlusion. No intracranial  stenosis or MCA branch occlusion identified. 4. Chronic spinal stenosis with spinal cord mass effect at C2-C3. New upper cervical fusion hardware.  Electronically Signed   By: Odessa Fleming M.D.   On: 09/08/2015 10:20   Dg Lumbar Spine 1 View  09/06/2015   CLINICAL DATA:  Laminectomy.  EXAM: LUMBAR SPINE - 1 VIEW  COMPARISON:  None.  FINDINGS: Metallic patch that lumbar vertebra are numbered with the lowest lumbar shaped vertebra as L5. This may be a transitional vertebra. 11 mm anterolisthesis of L4 on L5 noted. Metallic markers noted posteriorly at the L4-L5 level  IMPRESSION: Metallic marker noted posteriorly at the L4-L5 level.   Electronically Signed   By: Maisie Fus  Register   On: 09/06/2015 13:28   Dg Chest Port 1 View  09/13/2015   CLINICAL DATA:  81 year old male with respiratory failure. Subsequent encounter.  EXAM: PORTABLE CHEST 1 VIEW  COMPARISON:  09/11/2015.  FINDINGS: Poor inspiration with consolidation lung bases greater on left. This may represent atelectasis or infiltrate and without significant change.  Central pulmonary vascular prominence.  Prominent osteophyte mid thoracic vertebra, better evaluated on 08/12/2015 Aaron.  Mild cardiomegaly.  No gross pneumothorax.  IMPRESSION: Poor inspiration with consolidation lung bases greater on left. This may represent atelectasis or infiltrate and without significant change.  Central pulmonary vascular prominence.  Prominent osteophyte mid thoracic vertebra, better evaluated on 08/12/2015 Aaron.  Mild cardiomegaly.   Electronically Signed   By: Lacy Duverney M.D.   On: 09/13/2015 07:59   Dg Chest Port 1 View  09/12/2015   CLINICAL DATA:  Fever today.  EXAM: PORTABLE CHEST 1 VIEW  COMPARISON:  09/09/2015.  FINDINGS: Normal sized heart. Decreased patchy and linear density at the right lung base. Stable patchy density at the left lung base. Thoracic spine degenerative changes.  IMPRESSION: 1. Persistent patchy density at the left lung base, suspicious for  pneumonia. 2. Decreased atelectasis at the right lung base with residual patchy pneumonia or atelectasis   Electronically Signed   By: Beckie Salts M.D.   On: 09/12/2015 03:04   Dg Chest Port 1 View  09/09/2015   CLINICAL DATA:  Acute respiratory failure  EXAM: PORTABLE CHEST - 1 VIEW  COMPARISON:  09/08/2015  FINDINGS: Low lung volumes are present, causing crowding of the pulmonary vasculature. Airspace opacities at the left lung base and left infrahilar region, slightly worsened. Minimal right basilar airspace opacity medially.  Thoracic spondylosis. Borderline enlargement of the cardiopericardial silhouette.  Endotracheal and nasogastric tubes have been removed.  IMPRESSION: 1. Bibasilar airspace opacities, left greater than right, mildly worsened. 2. Borderline enlargement of the cardiopericardial silhouette.   Electronically Signed   By: Gaylyn Rong M.D.   On: 09/09/2015 08:08   Dg Chest Port 1 View  09/08/2015   CLINICAL DATA:  Acute respiratory failure portable chest x-ray of 09/07/2015  EXAM: PORTABLE CHEST - 1 VIEW  COMPARISON:  None.  FINDINGS: There is persistent basilar atelectasis left-greater-than-right. A small left effusion cannot be excluded. The right lung is grossly clear. Endotracheal tube and NG tube remain, unchanged in position.  IMPRESSION: Persistent left basilar atelectasis.  Stable mild cardiomegaly.   Electronically Signed   By: Dwyane Dee M.D.   On: 09/08/2015 08:39   Portable Chest Xray  09/07/2015   CLINICAL DATA:  Respiratory failure, ventilatory support  EXAM: PORTABLE CHEST - 1 VIEW  COMPARISON:  09/06/2015  FINDINGS: Endotracheal tube 7.8 cm above the carina. NG tube enters the stomach with the tip not visualized. Increased left basilar opacity/atelectasis. Right lung remains clear. No enlarging effusion or pneumothorax. Degenerative changes of the spine. Atherosclerosis of  the aorta.  IMPRESSION: Stable support apparatus.  Increased left basilar atelectasis    Electronically Signed   By: Judie Petit.  Shick M.D.   On: 09/07/2015 08:20   Dg Chest Port 1 View  09/06/2015   CLINICAL DATA:  Intubation orogastric tube placement  EXAM: PORTABLE CHEST - 1 VIEW  COMPARISON:  08/01/2010  FINDINGS: Endotracheal tube is 3.9 cm above the carina. Orogastric tube crosses the gastroesophageal junction with the. Mild left base opacity suggesting small effusion. Heart size and vascular pattern normal. Right lung clear. Mild atelectasis left lung base.  IMPRESSION: Lines and tubes as described above.   Electronically Signed   By: Esperanza Heir M.D.   On: 09/06/2015 16:45   Dg Abd Portable 1v  09/13/2015   CLINICAL DATA:  Nasogastric tube placement.  EXAM: PORTABLE ABDOMEN - 1 VIEW  COMPARISON:  09/10/2015 and chest x-ray today.  FINDINGS: There has been placement of a enteric tube with tip over the stomach in the left upper quadrant. Bowel gas pattern is nonobstructive. Persistent left base opacification. Moderate degenerative change of the spine.  IMPRESSION: Nonobstructive bowel gas pattern.  Enteric feeding tube with tip over the stomach in the left upper quadrant.   Electronically Signed   By: Elberta Fortis M.D.   On: 09/13/2015 09:58   Dg Vangie Bicker G Tube Plc W/fl-no Rad  09/15/2015   CLINICAL DATA:    NASO G TUBE PLACEMENT WITH FLUORO  Fluoroscopy was utilized by the requesting physician.  No radiographic  interpretation.    Dg Naso G Tube Plc W/fl-no Rad  09/10/2015   CLINICAL DATA:    NASO G TUBE PLACEMENT WITH FLUORO  Fluoroscopy was utilized by the requesting physician.  No radiographic  interpretation.    Dg Swallowing Func-speech Pathology  09/17/2015    Objective Swallowing Evaluation:    Patient Details  Name: [name] MRN: [id number] Date of Birth: [birth year]  Today's Date: 09/17/2015 Time: SLP Start Time (ACUTE ONLY): 1030-SLP Stop Time (ACUTE ONLY): 1115 SLP Time Calculation (min) (ACUTE ONLY): 45 min  Past Medical History:  Past Medical History  Diagnosis Date  .  Hypertension   . Diabetes mellitus without complication   . DDD (degenerative disc disease)   . Vertigo   . High cholesterol   . Peripheral vascular disease   . Pneumonia   . History of kidney stones   . Hx of gallstones   . GERD (gastroesophageal reflux disease)   . Headache   . Wears dentures    Past Surgical History:  Past Surgical History  Procedure Laterality Date  . Cholecystectomy    . Skin graft      Left leg  . Vein surgery    . Diagnostic laparoscopy    . Multiple tooth extractions    . Vascular surgery    . Eye surgery Bilateral   . Anterior cervical decomp/discectomy fusion N/A 09/06/2015    Procedure: Anterior Cervical Decompression Fusion Cervical two,Cervical  three,Cervical four-,Cervical five ;  Surgeon: Julio Sicks, MD;  Location:  [facility];  Service: Neurosurgery;  Laterality: N/A;  . Lumbar laminectomy/decompression microdiscectomy N/A 09/06/2015    Procedure: Lumbar three-four  decompresssive laminectomy  ;  Surgeon:  Julio Sicks, MD;  Location: [facility];  Service: Neurosurgery;   Laterality: N/A;   HPI:  Other Pertinent Information: [name] is an 82 y.o. male with a  past medical history significant for HTN, DM, hypercholesterolemia, PAD,  GERD, who underwent C2-3 and  C4-5 anterior cervical discectomy with  interbody fusion as well as L3-4 decompressive laminectomy with bilateral  L3 and L4 foraminotomies on 9/19, and few hours postoperatively was noted  to have significant left hemiparesis that prompted CT brain which  demonstrated findings concerning for acute ischemia in the right MCA  territory. Pt remained on the vent post-operatively 9/19-9/21.  No Data Recorded  Assessment / Plan / Recommendation CHL IP CLINICAL IMPRESSIONS 09/17/2015  Therapy Diagnosis Severe cervical esophageal phase dysphagia;Severe  pharyngeal phase dysphagia  Clinical Impression Pt demonstrates minimal improvement in function since  prior MBS. Standing secretions have improved and edema has decreased,  but  presence of cervical hardware at C4/5 on top of large bony protrusion  impedes epiglottic deflection and restricts opening of the cervical  esophagus. Thin and nectar thick liquids are significantly aspirated with  insufficient ability to expectorate. Puree textures and honey thick  liquids with large teaspoon boluses are heavy enough to trigger better  opeing, but still significant oropharyngeal residual places pt at high  risk of aspiration. With approval from Dr. Jordan Likes the pts hard collar was  removed to attempt a head turn/chin tuck, but pts movement was still  restricted and no benefit was evidenced. If pt and family choose to accept  risk of aspiration, a puree/and honey thick diet could be initiated and  trialed. If the pt wishes for more aggressive measures, then he should  remain NPO. Would not expect any more dramatic improvement in short term  and long term alternate nutrition would be needed, though risk of  aspiration of secretions will still be high.       CHL IP TREATMENT RECOMMENDATION 09/17/2015  Treatment Recommendations Therapy as outlined in treatment plan below     CHL IP DIET RECOMMENDATION 09/17/2015  SLP Diet Recommendations Dysphagia 1 (Puree);Honey  Liquid Administration via (None)  Medication Administration Crushed with puree  Compensations Multiple dry swallows after each bite/sip  Postural Changes and/or Swallow Maneuvers (None)     CHL IP OTHER RECOMMENDATIONS 09/17/2015  Recommended Consults (None)  Oral Care Recommendations Oral care before and after PO  Other Recommendations (None)     CHL IP FOLLOW UP RECOMMENDATIONS 09/16/2015  Follow up Recommendations Inpatient Rehab     CHL IP FREQUENCY AND DURATION 09/17/2015  Speech Therapy Frequency (ACUTE ONLY) min 2x/week  Treatment Duration 2 weeks     Pertinent Vitals/Pain NA    SLP Swallow Goals No flowsheet data found.  No flowsheet data found.    CHL IP REASON FOR REFERRAL 09/17/2015  Reason for Referral Objectively evaluate swallowing  function     CHL IP ORAL PHASE 09/17/2015  Lips (None)  Tongue (None)  Mucous membranes (None)  Nutritional status (None)  Other (None)  Oxygen therapy (None)  Oral Phase WFL  Oral - Pudding Teaspoon (None)  Oral - Pudding Cup (None)  Oral - Honey Teaspoon (None)  Oral - Honey Cup (None)  Oral - Honey Syringe (None)  Oral - Nectar Teaspoon (None)  Oral - Nectar Cup (None)  Oral - Nectar Straw (None)  Oral - Nectar Syringe (None)  Oral - Ice Chips (None)  Oral - Thin Teaspoon (None)  Oral - Thin Cup (None)  Oral - Thin Straw (None)  Oral - Thin Syringe (None)  Oral - Puree (None)  Oral - Mechanical Soft (None)  Oral - Regular (None)  Oral - Multi-consistency (None)  Oral - Pill (None)  Oral Phase - Comment (None)  CHL IP PHARYNGEAL PHASE 09/17/2015  Pharyngeal Phase Impaired  Pharyngeal - Pudding Teaspoon (None)  Penetration/Aspiration details (pudding teaspoon) (None)  Pharyngeal - Pudding Cup (None)  Penetration/Aspiration details (pudding cup) (None)  Pharyngeal - Honey Teaspoon (None)  Penetration/Aspiration details (honey teaspoon) (None)  Pharyngeal - Honey Cup (None)  Penetration/Aspiration details (honey cup) (None)  Pharyngeal - Honey Syringe (None)  Penetration/Aspiration details (honey syringe) (None)  Pharyngeal - Nectar Teaspoon (None)  Penetration/Aspiration details (nectar teaspoon) (None)  Pharyngeal - Nectar Cup (None)  Penetration/Aspiration details (nectar cup) (None)  Pharyngeal - Nectar Straw (None)  Penetration/Aspiration details (nectar straw) (None)  Pharyngeal - Nectar Syringe (None)  Penetration/Aspiration details (nectar syringe) (None)  Pharyngeal - Ice Chips (None)  Penetration/Aspiration details (ice chips) (None)  Pharyngeal - Thin Teaspoon (None)  Penetration/Aspiration details (thin teaspoon) (None)  Pharyngeal - Thin Cup (None)  Penetration/Aspiration details (thin cup) (None)  Pharyngeal - Thin Straw (None)  Penetration/Aspiration details (thin straw) (None)  Pharyngeal - Thin  Syringe (None)  Penetration/Aspiration details (thin syringe') (None)  Pharyngeal - Puree (None)  Penetration/Aspiration details (puree) (None)  Pharyngeal - Mechanical Soft (None)  Penetration/Aspiration details (mechanical soft) (None)  Pharyngeal - Regular (None)  Penetration/Aspiration details (regular) (None)  Pharyngeal - Multi-consistency (None)  Penetration/Aspiration details (multi-consistency) (None)  Pharyngeal - Pill (None)  Penetration/Aspiration details (pill) (None)  Pharyngeal Comment (None)      CHL IP CERVICAL ESOPHAGEAL PHASE 09/17/2015  Cervical Esophageal Phase (No Data)  Pudding Teaspoon (None)  Pudding Cup (None)  Honey Teaspoon (None)  Honey Cup (None)  Honey Straw (None)  Nectar Teaspoon (None)  Nectar Cup (None)  Nectar Straw (None)  Nectar Sippy Cup (None)  Thin Teaspoon (None)  Thin Cup (None)  Thin Straw (None)  Thin Sippy Cup (None)  Cervical Esophageal Comment (None)    No flowsheet data found.        Harlon Ditty, MA CCC-SLP [phone]  Claudine Mouton 09/17/2015, 1:14 PM    Dg C-arm 1-60 Min  09/06/2015   CLINICAL DATA:  ACDF C2-3 and C4-5  EXAM: DG C-ARM 61-120 MIN; DG CERVICAL SPINE - 1 VIEW  COMPARISON:  None.  FLUOROSCOPY TIME:  10 seconds  One image  FINDINGS: Single lateral intraoperative fluoroscopic image is provided of the cervical spine. Anterior cervical fusion with interbody spacer device at C2-3 and C4-5.  IMPRESSION: ACDF C2-3 and C4-5.   Electronically Signed   By: Elige Ko   On: 09/06/2015 12:43    Jeoffrey Massed, MD  [facility] Pager:[phone]  If 7PM-7AM, please contact night-coverage [email or web address] Password [email or web address] 09/21/2015, 1:55 PM   LOS: 15 days

## 2015-09-21 NOTE — Clinical Social Work Note (Signed)
Patient scheduled to have PEG placed on Friday, 10/7. Patient has a bed at [facility], [facility] of [place].   CSW will continue to follow pt and pt's family for continued support and to facilitate pt';s discharge needs once medically stable.   Derenda Fennel, MSW, LCSWA [phone] 09/21/2015 11:39 AM

## 2015-09-21 NOTE — Progress Notes (Signed)
Speech Language Pathology Treatment: Dysphagia  Patient Details Name: [name] MRN: [id number] DOB: 04/17/1930 Today's Date: 09/21/2015 Time: 7829-5621 SLP Time Calculation (min) (ACUTE ONLY): 14 min  Assessment / Plan / Recommendation Clinical Impression  Pt is scheduled for a PEG on Friday due to continued poor performance on swallowing studies with significant aspiration.  During today's session, pt continued to work on effortful swallow with consumption of limited ice chips.  Requires total assist and mod cues for attention and implementation.  He appears to be tolerating secretions better and had less frequent overt symptoms of deficits.  Recommend continued SLP tx once D/Cd to [facility] and repeat MBS as an OP in 4-6 weeks.  No family available today for education.  Will follow.    HPI Other Pertinent Information: [name] is an 81 y.o. male with a past medical history significant for HTN, DM, hypercholesterolemia, PAD, GERD, who underwent C2-3 and C4-5 anterior cervical discectomy with interbody fusion as well as L3-4 decompressive laminectomy with bilateral L3 and L4 foraminotomies on 9/19, and few hours postoperatively was noted to have significant left hemiparesis that prompted CT brain which demonstrated findings concerning for acute ischemia in the right MCA territory. Pt remained on the vent post-operatively 9/19-9/21.   Pertinent Vitals    SLP Plan       Recommendations Diet recommendations: NPO              Oral Care Recommendations: Oral care QID Follow up Recommendations: Skilled Nursing facility    GO    Florette Thai L. Samson Frederic, [place] CCC/SLP Pager [phone]  [name] 09/21/2015, 1:28 PM

## 2015-09-21 NOTE — Progress Notes (Signed)
Overall stable. No new issues.  Cont current care. PEG Friday. Snf post PEG

## 2015-09-22 LAB — CBC
HCT: 33.8 % — ABNORMAL LOW (ref 39.0–52.0)
Hemoglobin: 10.9 g/dL — ABNORMAL LOW (ref 13.0–17.0)
MCH: 30.1 pg (ref 26.0–34.0)
MCHC: 32.2 g/dL (ref 30.0–36.0)
MCV: 93.4 fL (ref 78.0–100.0)
PLATELETS: 153 10*3/uL (ref 150–400)
RBC: 3.62 MIL/uL — ABNORMAL LOW (ref 4.22–5.81)
RDW: 16.4 % — AB (ref 11.5–15.5)
WBC: 7.6 10*3/uL (ref 4.0–10.5)

## 2015-09-22 LAB — BASIC METABOLIC PANEL
ANION GAP: 6 (ref 5–15)
BUN: 22 mg/dL — ABNORMAL HIGH (ref 6–20)
CALCIUM: 8.1 mg/dL — AB (ref 8.9–10.3)
CO2: 26 mmol/L (ref 22–32)
Chloride: 101 mmol/L (ref 101–111)
Creatinine, Ser: 0.8 mg/dL (ref 0.61–1.24)
Glucose, Bld: 138 mg/dL — ABNORMAL HIGH (ref 65–99)
Potassium: 5.3 mmol/L — ABNORMAL HIGH (ref 3.5–5.1)
SODIUM: 133 mmol/L — AB (ref 135–145)

## 2015-09-22 LAB — GLUCOSE, CAPILLARY
GLUCOSE-CAPILLARY: 149 mg/dL — AB (ref 65–99)
GLUCOSE-CAPILLARY: 157 mg/dL — AB (ref 65–99)
GLUCOSE-CAPILLARY: 166 mg/dL — AB (ref 65–99)
GLUCOSE-CAPILLARY: 170 mg/dL — AB (ref 65–99)
Glucose-Capillary: 149 mg/dL — ABNORMAL HIGH (ref 65–99)
Glucose-Capillary: 167 mg/dL — ABNORMAL HIGH (ref 65–99)

## 2015-09-22 MED ORDER — SODIUM CHLORIDE 0.9 % IV SOLN
INTRAVENOUS | Status: DC
  Administered 2015-09-22: 10:00:00 via INTRAVENOUS

## 2015-09-22 MED ORDER — METOPROLOL TARTRATE 25 MG/10 ML ORAL SUSPENSION
50.0000 mg | Freq: Two times a day (BID) | ORAL | Status: DC
  Administered 2015-09-22 – 2015-09-24 (×3): 50 mg via ORAL
  Filled 2015-09-22 (×6): qty 20

## 2015-09-22 MED ORDER — SODIUM POLYSTYRENE SULFONATE 15 GM/60ML PO SUSP
30.0000 g | Freq: Once | ORAL | Status: AC
  Administered 2015-09-22: 30 g
  Filled 2015-09-22: qty 120

## 2015-09-22 NOTE — Progress Notes (Signed)
Overall stable. No significant change in status. Patient denies pain. Wounds all healing well. Awaiting PEG tube placement.

## 2015-09-22 NOTE — Progress Notes (Signed)
[facility] hospital[facility] consultation follow-up progress note  PATIENT DETAILS Name: [name] Age: 81 y.o. Sex: male Date of Birth: [birth year] Admit Date: 09/06/2015 Admitting Physician Julio Sicks, MD ZOX:[id number], Chancy Hurter, MD  Brief narrative:  [name] is a 81 y.o. male with history of HTN, DM, hypercholesterolemia, PAD, GERD, who presented to [name] on 9/19 for surgery to remedy progressive bilateral upper and lower extremity numbness from critical cervical and lumbar stenosis. Patient underwent anterior cervical decompression and lumbar decompression with laminectomy on 9/19. Postoperatively, patient remainrf on the ventilator in the intensive care unit. Unfortunately patient was noted to have dense left-sided hemiparesis, and also some concern for angioedema. Further workup demonstrated acute ischemia in the right MCA territory. Hospital course was further complicated by development of atrial fibrillation, presumed aspiration pneumonia and UTI. He unfortunately also developed significant dysphagia, and remains nothing by mouth with a panda tube in place. Patient was subsequently transferred to the medical surgical unit on 9/29, and [facility] was consulted to manage patient's ongoing medical issues.   Significant events during this hospital stay: 9/19 >>to OR for C2-C3 and C4-5 anterior anterior cervical discectomy with interbody fusion, anterior instrumentation at C2-3 and C4-5, L3-4 decompressive laminectomy with bilateral L3 and l4 foraminotomies. Returned to ICU on vent. 9/19>>Noted to have left hemiparesis-CT head -Suggestive of rt MCA infarct. 9/19 >> 9/21 OETT  9/25>> afib with rvr cards consult  Subjective: Sleepy this am-answers questions with eyes closed-follows commands.   Assessment/Plan: Principal Problem: Spinal stenosis in cervical/Lumbar region:s/p  C2-C3 and C4-5 anterior anterior cervical discectomy with interbody fusion,  anterior instrumentation at C2-3 and C4-5, L3-4 decompressive laminectomy with bilateral L3 and l4 foraminotomies.Cervical collar remains in place. Neurosurgery following  Active Problems: Acute Large right MCA CVA with scattered right PCA as well as punctate left MCA and right SCA : Continues to have dense left hemiparesis and hemi-neglect. Echo showed EF around 60-65%, carotid Doppler showed Bilateral ICA origin stenosis of up to 60% in the neck. Given atrial fibrillation, will need to start NOAC's-neurology recommended to wait 2 weeks before initiating anticoagulation as significant risk for hemorrhagic transition. Tentative NOAC start date 10/5-however now will be started after peg tube is placed. Previously on both aspirin and Plavix, however since peg tube being planned, Plavix discontinued and now only on aspirin.  Patient will need outpatient vascular surgery follow-up for carotid artery Doppler monitoring and CEA as indicated. Note LDL 41 (goal<70), A1c 6.1  Atrial fibrillation with UJW:JXBJYN intermittent episodes of RVR-increase Metoprolol to 50 mg BID. See above regarding anticoagulation. Cardiology following  Escherichia coli UTI: Afebrile, no leukocytosis. Completed Bactrim-on 10/4  Presumed aspiration pneumonia: Afebrile, no leukocytosis, completed course of Levaquin  Oral thrush:Completed course of diflucan on 10/4   Dysphagia: Multifactorial-suspect predominantly secondary to cervical surgery/hardware position-with contributions critical illness, and possible CVA. Has had numerous SLP evaluation including MBS-recommendations are to continue to remain nothing by mouth. Plans are to place a PEG tube later this week. Family and patient both aware that he will continue to remain at aspiration risk with or without PEG tube.  Hypokalemia: Resolved  Hyperkalemia:discontinue KCL supplementation-one dose of Kayexalate-recheck lytes in am  Dyslipidemia: Continue statin-LDL 41  (goal<70)  Type 2 diabetes: CBGs stable, continue suicide. Resume metformin on discharge. A1c 6.1  Hypertension: Controlled-continue metoprolol. Follow and adjust accordingly  Bilateral carotid artery stenosis: See above. Follow-up with vascular surgery  on discharge  Tobacco abuse disorder: Will need ongoing counseling.  Disposition: Remain inpatient-SNF on 10/8 once peg tube placed  Antimicrobial agents  See below  Anti-infectives    Start     Dose/Rate Route Frequency Ordered Stop   09/15/15 1015  fluconazole (DIFLUCAN) tablet 100 mg  Status:  Discontinued     100 mg Oral Daily 09/15/15 1007 09/15/15 1008   09/15/15 1015  fluconazole (DIFLUCAN) tablet 100 mg     100 mg Per Tube Daily 09/15/15 1008 09/23/15 0959   09/15/15 1000  levofloxacin (LEVAQUIN) IVPB 750 mg     750 mg 100 mL/hr over 90 Minutes Intravenous Every 24 hours 09/14/15 1157 09/18/15 1202   09/14/15 1300  sulfamethoxazole-trimethoprim (BACTRIM,SEPTRA) 200-40 MG/5ML suspension 20 mL     20 mL Per Tube Every 12 hours 09/14/15 1201 09/22/15 0959   09/12/15 1000  levofloxacin (LEVAQUIN) IVPB 500 mg  Status:  Discontinued     500 mg 100 mL/hr over 60 Minutes Intravenous Every 24 hours 09/12/15 0954 09/14/15 1157   09/10/15 1800  vancomycin (VANCOCIN) 1,250 mg in sodium chloride 0.9 % 250 mL IVPB  Status:  Discontinued     1,250 mg 166.7 mL/hr over 90 Minutes Intravenous Every 24 hours 09/09/15 2024 09/11/15 1122   09/06/15 1830  vancomycin (VANCOCIN) IVPB 1000 mg/200 mL premix  Status:  Discontinued     1,000 mg 200 mL/hr over 60 Minutes Intravenous Every 12 hours 09/06/15 1534 09/09/15 2024   09/06/15 0715  bacitracin 50,000 Units in sodium chloride irrigation 0.9 % 500 mL irrigation  Status:  Discontinued       As needed 09/06/15 0900 09/06/15 1300   09/06/15 0644  vancomycin (VANCOCIN) IVPB 1000 mg/200 mL premix     1,000 mg 200 mL/hr over 60 Minutes Intravenous On call to O.R. 09/06/15 4098 09/06/15 0852       DVT Prophylaxis: Prophylactic Heparin   Code Status: Full code   Family Communication None at bedside  Procedures: 9/19 >>to OR for C2-C3 and C4-5 anterior anterior cervical discectomy with interbody fusion, anterior instrumentation at C2-3 and C4-5, L3-4 decompressive laminectomy with bilateral L3 and l4 foraminotomies. 9/19 >> 9/21 OETT   CONSULTS:  cardiology, pulmonary/intensive care and Palliative  Time spent 30 minutes-Greater than 50% of this time was spent in counseling, explanation of diagnosis, planning of further management, and coordination of care.  MEDICATIONS: Scheduled Meds: . antiseptic oral rinse  7 mL Mouth Rinse q12n4p  . aspirin EC  81 mg Oral Daily  . chlorhexidine  15 mL Mouth Rinse BID  . feeding supplement (PRO-STAT SUGAR FREE 64)  30 mL Per Tube BID  . fluconazole  100 mg Per Tube Daily  . folic acid  1 mg Oral Daily  . heparin subcutaneous  5,000 Units Subcutaneous 3 times per day  . insulin aspart  0-15 Units Subcutaneous 6 times per day  . metoprolol tartrate  50 mg Oral BID  . pantoprazole (PROTONIX) IV  40 mg Intravenous QHS  . rosuvastatin  10 mg Oral Daily   Continuous Infusions: . sodium chloride 75 mL/hr at 09/22/15 1028  . feeding supplement (JEVITY 1.2 CAL) 1,000 mL (09/21/15 2334)   PRN Meds:.acetaminophen **OR** acetaminophen, cyclobenzaprine, iohexol, iohexol, loperamide, menthol-cetylpyridinium **OR** phenol, metoprolol, midazolam, midazolam, ondansetron (ZOFRAN) IV    PHYSICAL EXAM: Vital signs in last 24 hours: Filed Vitals:   09/22/15 0217 09/22/15 0426 09/22/15 0641 09/22/15 1030  BP: 121/53  127/60 136/65  Pulse: 89  81 109  Temp: 98.2 F (36.8 C)  98.6 F (37 C) 98.1 F (36.7 C)  TempSrc: Axillary  Oral Oral  Resp: Height:      Weight:  79.6 kg (175 lb 7.8 oz)    SpO2: 98%  99% 94%    Weight change: -0.5 kg (-1 lb 1.6 oz) Filed Weights   09/20/15 0446 09/21/15 0500 09/22/15 0426  Weight: 83.2  kg (183 lb 6.8 oz) 80.1 kg (176 lb 9.4 oz) 79.6 kg (175 lb 7.8 oz)   Body mass index is 23.16 kg/(m^2).   Gen Exam: Awake and alert. Panda tube in place, cervical collar in place Chest: B/L Clear anteriorly CVS: S1 S2 Regular Abdomen: soft, BS +, non tender, non distended.  Extremities: no edema, lower extremities warm to touch. Neurologic: Dense left hemiparesis-with left hemi-neglect. Skin: No Rash.   Wounds: N/A.    Intake/Output from previous day:  Intake/Output Summary (Last 24 hours) at 09/22/15 1127 Last data filed at 09/21/15 1900  Gross per 24 hour  Intake   3240 ml  Output      0 ml  Net   3240 ml     LAB RESULTS: CBC  Recent Labs Lab 09/22/15 0602  WBC 7.6  HGB 10.9*  HCT 33.8*  PLT 153  MCV 93.4  MCH 30.1  MCHC 32.2  RDW 16.4*    Chemistries   Recent Labs Lab 09/16/15 0405 09/22/15 0602  NA 136 133*  K 4.2 5.3*  CL 104 101  CO2 25 26  GLUCOSE 206* 138*  BUN 16 22*  CREATININE 0.70 0.80  CALCIUM 8.0* 8.1*    CBG:  Recent Labs Lab 09/21/15 1602 09/21/15 2033 09/22/15 0005 09/22/15 0408 09/22/15 0739  GLUCAP 146* 166* 170* 149* 149*    GFR Estimated Creatinine Clearance: 78.8 mL/min (by C-G formula based on Cr of 0.8).  Coagulation profile No results for input(s): INR, PROTIME in the last 168 hours.  Cardiac Enzymes No results for input(s): CKMB, TROPONINI, MYOGLOBIN in the last 168 hours.  Invalid input(s): CK  Invalid input(s): POCBNP No results for input(s): DDIMER in the last 72 hours. No results for input(s): HGBA1C in the last 72 hours. No results for input(s): CHOL, HDL, LDLCALC, TRIG, CHOLHDL, LDLDIRECT in the last 72 hours. No results for input(s): TSH, T4TOTAL, T3FREE, THYROIDAB in the last 72 hours.  Invalid input(s): FREET3 No results for input(s): VITAMINB12, FOLATE, FERRITIN, TIBC, IRON, RETICCTPCT in the last 72 hours. No results for input(s): LIPASE, AMYLASE in the last 72 hours.  Urine Studies No  results for input(s): UHGB, CRYS in the last 72 hours.  Invalid input(s): UACOL, UAPR, USPG, UPH, UTP, UGL, UKET, UBIL, UNIT, UROB, ULEU, UEPI, UWBC, URBC, UBAC, CAST, UCOM, BILUA  MICROBIOLOGY: Recent Results (from the past 240 hour(s))  Culture, Urine     Status: None   Collection Time: 09/12/15 12:45 PM  Result Value Ref Range Status   Specimen Description URINE, CLEAN CATCH  Final   Special Requests Normal  Final   Culture >=100,000 COLONIES/mL ESCHERICHIA COLI  Final   Report Status 09/14/2015 FINAL  Final   Organism ID, Bacteria ESCHERICHIA COLI  Final      Susceptibility   Escherichia coli - MIC*    AMPICILLIN >=32 RESISTANT Resistant     CEFAZOLIN <=4 SENSITIVE Sensitive     CEFTRIAXONE <=1 SENSITIVE Sensitive     CIPROFLOXACIN >=4 RESISTANT Resistant     GENTAMICIN <=1  SENSITIVE Sensitive     IMIPENEM <=0.25 SENSITIVE Sensitive     NITROFURANTOIN <=16 SENSITIVE Sensitive     TRIMETH/SULFA <=20 SENSITIVE Sensitive     AMPICILLIN/SULBACTAM >=32 RESISTANT Resistant     PIP/TAZO <=4 SENSITIVE Sensitive     * >=100,000 COLONIES/mL ESCHERICHIA COLI    RADIOLOGY STUDIES/RESULTS: Dg Cervical Spine 1 View  09/06/2015   CLINICAL DATA:  ACDF C2-3 and C4-5  EXAM: DG C-ARM 61-120 MIN; DG CERVICAL SPINE - 1 VIEW  COMPARISON:  None.  FLUOROSCOPY TIME:  10 seconds  One image  FINDINGS: Single lateral intraoperative fluoroscopic image is provided of the cervical spine. Anterior cervical fusion with interbody spacer device at C2-3 and C4-5.  IMPRESSION: ACDF C2-3 and C4-5.   Electronically Signed   By: Elige Ko   On: 09/06/2015 12:43   Dg Abd 1 View  09/15/2015   CLINICAL DATA:  Feeding tube placement  EXAM: ABDOMEN - 1 VIEW  COMPARISON:  09/13/2015  TECHNIQUE: Twelve French feeding tube placed under fluoroscopy by Delice Bison Dingus RT-R.  20 mL of Omnipaque 300 was utilized to confirm placement.  FLUOROSCOPY TIME:  5 minutes 12 seconds  FINDINGS: Contrast opacifies second third portions the  duodenum as well as RIGHT proximal jejunal loop.  Feeding tube is placed with the tip at/beyond ligament of Treitz.  IMPRESSION: Placement of feeding tube with tip at/beyond ligament of Treitz.   Electronically Signed   By: Ulyses Southward M.D.   On: 09/15/2015 14:49   Dg Abd 1 View  09/10/2015   CLINICAL DATA:  Nasoenteric feeding tube placement by RT  EXAM: ABDOMEN - 1 VIEW  COMPARISON:  06/15/2006  FINDINGS: Feeding tube extends to the ligament of Treitz, confirmed with contrast injection.  IMPRESSION: Feeding tube placement to ligament of Treitz.   Electronically Signed   By: Corlis Leak M.D.   On: 09/10/2015 13:26   Ct Head Wo Contrast  09/06/2015   CLINICAL DATA:  Left-sided weakness following cervical surgery  EXAM: CT HEAD WITHOUT CONTRAST  TECHNIQUE: Contiguous axial images were obtained from the base of the skull through the vertex without intravenous contrast.  COMPARISON:  06/27/2013  FINDINGS: The bony calvarium is intact. No gross soft tissue abnormality is seen. Mild atrophic changes are noted. There is some generalized decreased attenuation identified in the distribution of the right middle cerebral artery suggestive of a early ischemic event. MRI may be helpful further evaluation. No other focal areas of ischemia or hemorrhage are noted.  IMPRESSION: Chronic atrophic changes.  Generalized decreased attenuation in the distribution of the right middle cerebral artery suggestive of early ischemia. MRI may be helpful for further evaluation.  These results were called by telephone at the time of interpretation on 09/06/2015 at 5:34 pm to [facility], the pts nurse, who verbally acknowledged these results.   Electronically Signed   By: Alcide Clever M.D.   On: 09/06/2015 17:37   Mr Shirlee Latch Wo Contrast  09/08/2015   CLINICAL DATA:  81 year old male with left side weakness following cervical spine and lumbar surgery. Initial encounter.  EXAM: MRI HEAD WITHOUT CONTRAST AND WITH CONTRAST  MRA HEAD WITHOUT  CONTRAST  MRA NECK WITHOUT AND WITH CONTRAST  TECHNIQUE: Multiplanar, multiecho pulse sequences of the brain and surrounding structures were obtained without intravenous contrast. Angiographic images of the Circle of Willis were obtained using MRA technique without intravenous contrast. Angiographic images of the neck were obtained using MRA technique without and with intravenous contrast. Carotid stenosis  measurements (when applicable) are obtained utilizing NASCET criteria, using the distal internal carotid diameter as the denominator.  CONTRAST:  20mL MULTIHANCE GADOBENATE DIMEGLUMINE 529 MG/ML IV SOLN  COMPARISON:  Head CT without contrast 09/06/2015. Preoperative Cervical spine MRI 08/12/2015  FINDINGS: MRI HEAD FINDINGS  Confluent restricted diffusion throughout most of the right hemisphere. Near complete involvement of the right MCA territory. Right MCA/PCA watershed involvement extending to the lateral occipital pole. Scattered involvement elsewhere in the right PCA territory. Some of the right temporal lobe also is spared.  Scattered small areas of restricted diffusion also in the left MCA territory, an the right superior cerebellar artery territory.  Major intracranial vascular flow voids are preserved, see MRA findings below.  Confluent cytotoxic edema in the right hemisphere. Areas of petechial hemorrhage in the anterior and posterior right MCA territories. Mass effect on the right lateral ventricle. Mild leftward midline shift of 3 mm. No ventriculomegaly. Basilar cisterns are patent. No extra-axial or intraventricular hemorrhage. Following contrast, no significant post ischemic enhancement.  Minimal other nonspecific cerebral white matter T2 and FLAIR hyperintensity. There is a small chronic lacunar infarct in the right PICA territory (series 6, image 5).  Visible internal auditory structures appear normal. Trace mastoid fluid. Mild paranasal sinus mucosal thickening. The patient is intubated. Trace  fluid in the oral cavity. Postoperative changes to the globes. Negative scalp soft tissues.  Hardware susceptibility artifact in the cervical spine. Chronic spinal Stenosis with mass effect on the spinal cord at C2-C3 (series 3, image 13).  MRA NECK FINDINGS  Precontrast time-of-flight imaging. A antegrade flow in both carotid and vertebral arteries in the neck and to the skullbase.  Post-contrast neck MRA images. Three vessel arch configuration. No great vessel origin stenosis.  Negative right CCA proximal to the right carotid bifurcation. At the right ICA origin there is irregularity and stenosis of up to 60 % with respect to the distal vessel (series 1302, image 84 and series 16109, image 5). The cervical right ICA is patent and otherwise appears negative to the skullbase.  Negative left CCA. Irregularity and stenosis at the left ICA origin also measuring up to 60 % with respect to the distal vessel and best seen on series 1302, image 71. The left ICA remains patent and is otherwise negative to the skullbase.  No proximal subclavian artery stenosis. Normal left vertebral artery origin. Mild if any stenosis at the right vertebral artery origin. The left vertebral artery is mildly dominant throughout.  MRA HEAD FINDINGS  Antegrade flow in the posterior circulation. Dominant distal left vertebral artery. Normal right PICA origin. Dominant appearing left AICA with a patent origin. Patent vertebrobasilar junction. No basilar stenosis. Normal SCA and PCA origins, fetal type on the right. Both posterior communicating arteries are present. Bilateral PCA branches are within normal limits.  Antegrade flow in both ICA siphons. No siphon stenosis. Ophthalmic and posterior communicating artery origins are within normal limits. Small probable atherosclerotic pseudo lesion of the left ICA siphon in the superior hypophyseal region (series 505, image 11). Patent carotid termini. Normal MCA and ACA origins.  Anterior communicating  artery and visualized ACA branches are within normal limits. Left MCA M1 segment and bifurcation are within normal limits. No left MCA branch occlusion identified. Right MCA M1 segment and bifurcation are within normal limits. No major right MCA branch occlusion identified.  IMPRESSION: 1. Large right MCA and partial right PCA infarcts with mild petechial hemorrhage, cytotoxic edema, and mild intracranial mass effect with leftward midline  shift of 3 mm. 2. Scattered small left MCA and occasional right SCA acute infarcts. 3. Bilateral ICA origin stenosis of up to 60% in the neck. No emergent large vessel occlusion. No intracranial stenosis or MCA branch occlusion identified. 4. Chronic spinal stenosis with spinal cord mass effect at C2-C3. New upper cervical fusion hardware.   Electronically Signed   By: Odessa Fleming M.D.   On: 09/08/2015 10:20   Mr Angiogram Neck W Wo Contrast  09/08/2015   CLINICAL DATA:  81 year old male with left side weakness following cervical spine and lumbar surgery. Initial encounter.  EXAM: MRI HEAD WITHOUT CONTRAST AND WITH CONTRAST  MRA HEAD WITHOUT CONTRAST  MRA NECK WITHOUT AND WITH CONTRAST  TECHNIQUE: Multiplanar, multiecho pulse sequences of the brain and surrounding structures were obtained without intravenous contrast. Angiographic images of the Circle of Willis were obtained using MRA technique without intravenous contrast. Angiographic images of the neck were obtained using MRA technique without and with intravenous contrast. Carotid stenosis measurements (when applicable) are obtained utilizing NASCET criteria, using the distal internal carotid diameter as the denominator.  CONTRAST:  20mL MULTIHANCE GADOBENATE DIMEGLUMINE 529 MG/ML IV SOLN  COMPARISON:  Head CT without contrast 09/06/2015. Preoperative Cervical spine MRI 08/12/2015  FINDINGS: MRI HEAD FINDINGS  Confluent restricted diffusion throughout most of the right hemisphere. Near complete involvement of the right MCA  territory. Right MCA/PCA watershed involvement extending to the lateral occipital pole. Scattered involvement elsewhere in the right PCA territory. Some of the right temporal lobe also is spared.  Scattered small areas of restricted diffusion also in the left MCA territory, an the right superior cerebellar artery territory.  Major intracranial vascular flow voids are preserved, see MRA findings below.  Confluent cytotoxic edema in the right hemisphere. Areas of petechial hemorrhage in the anterior and posterior right MCA territories. Mass effect on the right lateral ventricle. Mild leftward midline shift of 3 mm. No ventriculomegaly. Basilar cisterns are patent. No extra-axial or intraventricular hemorrhage. Following contrast, no significant post ischemic enhancement.  Minimal other nonspecific cerebral white matter T2 and FLAIR hyperintensity. There is a small chronic lacunar infarct in the right PICA territory (series 6, image 5).  Visible internal auditory structures appear normal. Trace mastoid fluid. Mild paranasal sinus mucosal thickening. The patient is intubated. Trace fluid in the oral cavity. Postoperative changes to the globes. Negative scalp soft tissues.  Hardware susceptibility artifact in the cervical spine. Chronic spinal Stenosis with mass effect on the spinal cord at C2-C3 (series 3, image 13).  MRA NECK FINDINGS  Precontrast time-of-flight imaging. A antegrade flow in both carotid and vertebral arteries in the neck and to the skullbase.  Post-contrast neck MRA images. Three vessel arch configuration. No great vessel origin stenosis.  Negative right CCA proximal to the right carotid bifurcation. At the right ICA origin there is irregularity and stenosis of up to 60 % with respect to the distal vessel (series 1302, image 84 and series 16109, image 5). The cervical right ICA is patent and otherwise appears negative to the skullbase.  Negative left CCA. Irregularity and stenosis at the left ICA  origin also measuring up to 60 % with respect to the distal vessel and best seen on series 1302, image 71. The left ICA remains patent and is otherwise negative to the skullbase.  No proximal subclavian artery stenosis. Normal left vertebral artery origin. Mild if any stenosis at the right vertebral artery origin. The left vertebral artery is mildly dominant throughout.  MRA HEAD  FINDINGS  Antegrade flow in the posterior circulation. Dominant distal left vertebral artery. Normal right PICA origin. Dominant appearing left AICA with a patent origin. Patent vertebrobasilar junction. No basilar stenosis. Normal SCA and PCA origins, fetal type on the right. Both posterior communicating arteries are present. Bilateral PCA branches are within normal limits.  Antegrade flow in both ICA siphons. No siphon stenosis. Ophthalmic and posterior communicating artery origins are within normal limits. Small probable atherosclerotic pseudo lesion of the left ICA siphon in the superior hypophyseal region (series 505, image 11). Patent carotid termini. Normal MCA and ACA origins.  Anterior communicating artery and visualized ACA branches are within normal limits. Left MCA M1 segment and bifurcation are within normal limits. No left MCA branch occlusion identified. Right MCA M1 segment and bifurcation are within normal limits. No major right MCA branch occlusion identified.  IMPRESSION: 1. 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 2. Scattered small left MCA and occasional right SCA acute infarcts. 3. Bilateral ICA origin stenosis of up to 60% in the neck. No emergent large vessel occlusion. No intracranial stenosis or MCA branch occlusion identified. 4. Chronic spinal stenosis with spinal cord mass effect at C2-C3. New upper cervical fusion hardware.   Electronically Signed   By: Odessa Fleming M.D.   On: 09/08/2015  10:20   Mr Laqueta Jean UJ Contrast  09/08/2015   CLINICAL DATA:  81 year old male with left side weakness following cervical spine and lumbar surgery. Initial encounter.  EXAM: MRI HEAD WITHOUT CONTRAST AND WITH CONTRAST  MRA HEAD WITHOUT CONTRAST  MRA NECK WITHOUT AND WITH CONTRAST  TECHNIQUE: Multiplanar, multiecho pulse sequences of the brain and surrounding structures were obtained without intravenous contrast. Angiographic images of the Circle of Willis were obtained using MRA technique without intravenous contrast. Angiographic images of the neck were obtained using MRA technique without and with intravenous contrast. Carotid stenosis measurements (when applicable) are obtained utilizing NASCET criteria, using the distal internal carotid diameter as the denominator.  CONTRAST:  20mL MULTIHANCE GADOBENATE DIMEGLUMINE 529 MG/ML IV [facility]  COMPARISON:  Head CT without contrast 09/06/2015. Preoperative Cervical spine MRI 08/12/2015  FINDINGS: MRI HEAD FINDINGS  Confluent restricted diffusion throughout most of the right hemisphere. Near complete involvement of the right MCA territory. Right MCA/PCA watershed involvement extending to the lateral occipital pole. Scattered involvement elsewhere in the right PCA territory. Some of the right temporal lobe also is spared.  Scattered small areas of restricted diffusion also in the left MCA territory, an the right superior cerebellar artery territory.  Major intracranial vascular flow voids are preserved, see MRA findings below.  Confluent cytotoxic edema in the right hemisphere. Areas of petechial hemorrhage in the anterior and posterior right MCA territories. Mass effect on the right lateral ventricle. Mild leftward midline shift of 3 mm. No ventriculomegaly. Basilar cisterns are patent. No extra-axial or intraventricular hemorrhage. Following contrast, no significant post ischemic enhancement.  Minimal other nonspecific cerebral white matter T2 and FLAIR hyperintensity.  There is a small chronic lacunar infarct in the right PICA territory (series 6, image 5).  Visible internal auditory structures appear normal. Trace mastoid fluid. Mild paranasal sinus mucosal thickening. The patient is intubated. Trace fluid in the oral cavity. Postoperative changes to the globes. Negative scalp soft tissues.  Hardware susceptibility artifact in the cervical spine. Chronic spinal Stenosis with mass effect on the spinal cord at C2-C3 (series  3, image 13).  MRA NECK FINDINGS  Precontrast time-of-flight imaging. A antegrade flow in both carotid and vertebral arteries in the neck and to the skullbase.  Post-contrast neck MRA images. Three vessel arch configuration. No great vessel origin stenosis.  Negative right CCA proximal to the right carotid bifurcation. At the right ICA origin there is irregularity and stenosis of up to 60 % with respect to the distal vessel (series 1302, image 84 and series 14782, image 5). The cervical right ICA is patent and otherwise appears negative to the skullbase.  Negative left CCA. Irregularity and stenosis at the left ICA origin also measuring up to 60 % with respect to the distal vessel and best seen on series 1302, image 71. The left ICA remains patent and is otherwise negative to the skullbase.  No proximal subclavian artery stenosis. Normal left vertebral artery origin. Mild if any stenosis at the right vertebral artery origin. The left vertebral artery is mildly dominant throughout.  MRA HEAD FINDINGS  Antegrade flow in the posterior circulation. Dominant distal left vertebral artery. Normal right PICA origin. Dominant appearing left AICA with a patent origin. Patent vertebrobasilar junction. No basilar stenosis. Normal SCA and PCA origins, fetal type on the right. Both posterior communicating arteries are present. Bilateral PCA branches are within normal limits.  Antegrade flow in both ICA siphons. No siphon stenosis. Ophthalmic and posterior communicating artery  origins are within normal limits. Small probable atherosclerotic pseudo lesion of the left ICA siphon in the superior hypophyseal region (series 505, image 11). Patent carotid termini. Normal MCA and ACA origins.  Anterior communicating artery and visualized ACA branches are within normal limits. Left MCA M1 segment and bifurcation are within normal limits. No left MCA branch occlusion identified. Right MCA M1 segment and bifurcation are within normal limits. No major right MCA branch occlusion identified.  IMPRESSION: 1. 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 2. Scattered small left MCA and occasional right SCA acute infarcts. 3. Bilateral ICA origin stenosis of up to 60% in the neck. No emergent large vessel occlusion. No intracranial stenosis or MCA branch occlusion identified. 4. Chronic spinal stenosis with spinal cord mass effect at C2-C3. New upper cervical fusion hardware.   Electronically Signed   By: Odessa Fleming M.D.   On: 09/08/2015 10:20   Dg Lumbar Spine 1 View  09/06/2015   CLINICAL DATA:  Laminectomy.  EXAM: LUMBAR SPINE - 1 VIEW  COMPARISON:  None.  FINDINGS: Metallic patch that lumbar vertebra are numbered with the lowest lumbar shaped vertebra as L5. This may be a transitional vertebra. 11 mm anterolisthesis of L4 on L5 noted. Metallic markers noted posteriorly at the L4-L5 level  IMPRESSION: Metallic marker noted posteriorly at the L4-L5 level.   Electronically Signed   By: Maisie Fus  Register   On: 09/06/2015 13:28   Dg Chest Port 1 View  09/13/2015   CLINICAL DATA:  81 year old male with respiratory failure. Subsequent encounter.  EXAM: PORTABLE CHEST 1 VIEW  COMPARISON:  09/11/2015.  FINDINGS: Poor inspiration with consolidation lung bases greater on left. This may represent atelectasis or infiltrate and without significant change.  Central pulmonary vascular  prominence.  Prominent osteophyte mid thoracic vertebra, better evaluated on 08/12/2015 MR.  Mild cardiomegaly.  No gross pneumothorax.  IMPRESSION: Poor inspiration with consolidation lung bases greater on left. This may represent atelectasis or infiltrate and without significant  change.  Central pulmonary vascular prominence.  Prominent osteophyte mid thoracic vertebra, better evaluated on 08/12/2015 MR.  Mild cardiomegaly.   Electronically Signed   By: Lacy Duverney M.D.   On: 09/13/2015 07:59   Dg Chest Port 1 View  09/12/2015   CLINICAL DATA:  Fever today.  EXAM: PORTABLE CHEST 1 VIEW  COMPARISON:  09/09/2015.  FINDINGS: Normal sized heart. Decreased patchy and linear density at the right lung base. Stable patchy density at the left lung base. Thoracic spine degenerative changes.  IMPRESSION: 1. Persistent patchy density at the left lung base, suspicious for pneumonia. 2. Decreased atelectasis at the right lung base with residual patchy pneumonia or atelectasis   Electronically Signed   By: Beckie Salts M.D.   On: 09/12/2015 03:04   Dg Chest Port 1 View  09/09/2015   CLINICAL DATA:  Acute respiratory failure  EXAM: PORTABLE CHEST - 1 VIEW  COMPARISON:  09/08/2015  FINDINGS: Low lung volumes are present, causing crowding of the pulmonary vasculature. Airspace opacities at the left lung base and left infrahilar region, slightly worsened. Minimal right basilar airspace opacity medially.  Thoracic spondylosis. Borderline enlargement of the cardiopericardial silhouette.  Endotracheal and nasogastric tubes have been removed.  IMPRESSION: 1. Bibasilar airspace opacities, left greater than right, mildly worsened. 2. Borderline enlargement of the cardiopericardial silhouette.   Electronically Signed   By: Gaylyn Rong M.D.   On: 09/09/2015 08:08   Dg Chest Port 1 View  09/08/2015   CLINICAL DATA:  Acute respiratory failure portable chest x-ray of 09/07/2015  EXAM: PORTABLE CHEST - 1 VIEW  COMPARISON:   None.  FINDINGS: There is persistent basilar atelectasis left-greater-than-right. A small left effusion cannot be excluded. The right lung is grossly clear. Endotracheal tube and NG tube remain, unchanged in position.  IMPRESSION: Persistent left basilar atelectasis.  Stable mild cardiomegaly.   Electronically Signed   By: Dwyane Dee M.D.   On: 09/08/2015 08:39   Portable Chest Xray  09/07/2015   CLINICAL DATA:  Respiratory failure, ventilatory support  EXAM: PORTABLE CHEST - 1 VIEW  COMPARISON:  09/06/2015  FINDINGS: Endotracheal tube 7.8 cm above the carina. NG tube enters the stomach with the tip not visualized. Increased left basilar opacity/atelectasis. Right lung remains clear. No enlarging effusion or pneumothorax. Degenerative changes of the spine. Atherosclerosis of the aorta.  IMPRESSION: Stable support apparatus.  Increased left basilar atelectasis   Electronically Signed   By: Judie Petit.  Shick M.D.   On: 09/07/2015 08:20   Dg Chest Port 1 View  09/06/2015   CLINICAL DATA:  Intubation orogastric tube placement  EXAM: PORTABLE CHEST - 1 VIEW  COMPARISON:  08/01/2010  FINDINGS: Endotracheal tube is 3.9 cm above the carina. Orogastric tube crosses the gastroesophageal junction with the. Mild left base opacity suggesting small effusion. Heart size and vascular pattern normal. Right lung clear. Mild atelectasis left lung base.  IMPRESSION: Lines and tubes as described above.   Electronically Signed   By: Esperanza Heir M.D.   On: 09/06/2015 16:45   Dg Abd Portable 1v  09/13/2015   CLINICAL DATA:  Nasogastric tube placement.  EXAM: PORTABLE ABDOMEN - 1 VIEW  COMPARISON:  09/10/2015 and chest x-ray today.  FINDINGS: There has been placement of a enteric tube with tip over the stomach in the left upper quadrant. Bowel gas pattern is nonobstructive. Persistent left base opacification. Moderate degenerative change of the spine.  IMPRESSION: Nonobstructive bowel gas pattern.  Enteric feeding tube with tip over  the stomach in the left upper quadrant.   Electronically Signed   By: Elberta Fortis M.D.   On: 09/13/2015 09:58   Dg Vangie Bicker G Tube Plc W/fl-no Rad  09/15/2015   CLINICAL DATA:    NASO G TUBE PLACEMENT WITH FLUORO  Fluoroscopy was utilized by the requesting physician.  No radiographic  interpretation.    Dg Naso G Tube Plc W/fl-no Rad  09/10/2015   CLINICAL DATA:    NASO G TUBE PLACEMENT WITH FLUORO  Fluoroscopy was utilized by the requesting physician.  No radiographic  interpretation.    Dg Swallowing Func-speech Pathology  09/17/2015    Objective Swallowing Evaluation:    Patient Details  Name: [name] MRN: [id number] Date of Birth: 11/08/1930  Today's Date: 09/17/2015 Time: SLP Start Time (ACUTE ONLY): 1030-SLP Stop Time (ACUTE ONLY): 1115 SLP Time Calculation (min) (ACUTE ONLY): 45 min  Past Medical History:  Past Medical History  Diagnosis Date  . Hypertension   . Diabetes mellitus without complication   . DDD (degenerative disc disease)   . Vertigo   . High cholesterol   . Peripheral vascular disease   . Pneumonia   . History of kidney stones   . Hx of gallstones   . GERD (gastroesophageal reflux disease)   . Headache   . Wears dentures    Past Surgical History:  Past Surgical History  Procedure Laterality Date  . Cholecystectomy    . Skin graft      Left leg  . Vein surgery    . Diagnostic laparoscopy    . Multiple tooth extractions    . Vascular surgery    . Eye surgery Bilateral   . Anterior cervical decomp/discectomy fusion N/A 09/06/2015    Procedure: Anterior Cervical Decompression Fusion Cervical two,Cervical  three,Cervical four-,Cervical five ;  Surgeon: Julio Sicks, MD;  Location:  [facility];  Service: Neurosurgery;  Laterality: N/A;  . Lumbar laminectomy/decompression microdiscectomy N/A 09/06/2015    Procedure: Lumbar three-four  decompresssive laminectomy  ;  Surgeon:  Julio Sicks, MD;  Location: [facility];  Service: Neurosurgery;   Laterality: N/A;   HPI:  Other Pertinent  Information: [name] is an 81 y.o. male with a  past medical history significant for HTN, DM, hypercholesterolemia, PAD,  GERD, who underwent C2-3 and C4-5 anterior cervical discectomy with  interbody fusion as well as L3-4 decompressive laminectomy with bilateral  L3 and L4 foraminotomies on 9/19, and few hours postoperatively was noted  to have significant left hemiparesis that prompted CT brain which  demonstrated findings concerning for acute ischemia in the right MCA  territory. Pt remained on the vent post-operatively 9/19-9/21.  No Data Recorded  Assessment / Plan / Recommendation CHL IP CLINICAL IMPRESSIONS 09/17/2015  Therapy Diagnosis Severe cervical esophageal phase dysphagia;Severe  pharyngeal phase dysphagia  Clinical Impression Pt demonstrates minimal improvement in function since  prior MBS. Standing secretions have improved and edema has decreased, but  presence of cervical hardware at C4/5 on top of large bony protrusion  impedes epiglottic deflection and restricts opening of the cervical  esophagus. Thin and nectar thick liquids are significantly aspirated with  insufficient ability to expectorate. Puree textures and honey thick  liquids with large teaspoon boluses are heavy enough to trigger better  opeing, but still significant oropharyngeal residual places pt at high  risk of aspiration. With approval from Dr. Jordan Likes the pts hard collar was  removed to attempt a head turn/chin tuck, but pts  movement was still  restricted and no benefit was evidenced. If pt and family choose to accept  risk of aspiration, a puree/and honey thick diet could be initiated and  trialed. If the pt wishes for more aggressive measures, then he should  remain NPO. Would not expect any more dramatic improvement in short term  and long term alternate nutrition would be needed, though risk of  aspiration of secretions will still be high.       CHL IP TREATMENT RECOMMENDATION 09/17/2015  Treatment Recommendations  Therapy as outlined in treatment plan below     CHL IP DIET RECOMMENDATION 09/17/2015  SLP Diet Recommendations Dysphagia 1 (Puree);Honey  Liquid Administration via (None)  Medication Administration Crushed with puree  Compensations Multiple dry swallows after each bite/sip  Postural Changes and/or Swallow Maneuvers (None)     CHL IP OTHER RECOMMENDATIONS 09/17/2015  Recommended Consults (None)  Oral Care Recommendations Oral care before and after PO  Other Recommendations (None)     CHL IP FOLLOW UP RECOMMENDATIONS 09/16/2015  Follow up Recommendations Inpatient Rehab     CHL IP FREQUENCY AND DURATION 09/17/2015  Speech Therapy Frequency (ACUTE ONLY) min 2x/week  Treatment Duration 2 weeks     Pertinent Vitals/Pain NA    SLP Swallow Goals No flowsheet data found.  No flowsheet data found.    CHL IP REASON FOR REFERRAL 09/17/2015  Reason for Referral Objectively evaluate swallowing function     CHL IP ORAL PHASE 09/17/2015  Lips (None)  Tongue (None)  Mucous membranes (None)  Nutritional status (None)  Other (None)  Oxygen therapy (None)  Oral Phase WFL  Oral - Pudding Teaspoon (None)  Oral - Pudding Cup (None)  Oral - Honey Teaspoon (None)  Oral - Honey Cup (None)  Oral - Honey Syringe (None)  Oral - Nectar Teaspoon (None)  Oral - Nectar Cup (None)  Oral - Nectar Straw (None)  Oral - Nectar Syringe (None)  Oral - Ice Chips (None)  Oral - Thin Teaspoon (None)  Oral - Thin Cup (None)  Oral - Thin Straw (None)  Oral - Thin Syringe (None)  Oral - Puree (None)  Oral - Mechanical Soft (None)  Oral - Regular (None)  Oral - Multi-consistency (None)  Oral - Pill (None)  Oral Phase - Comment (None)      CHL IP PHARYNGEAL PHASE 09/17/2015  Pharyngeal Phase Impaired  Pharyngeal - Pudding Teaspoon (None)  Penetration/Aspiration details (pudding teaspoon) (None)  Pharyngeal - Pudding Cup (None)  Penetration/Aspiration details (pudding cup) (None)  Pharyngeal - Honey Teaspoon (None)  Penetration/Aspiration details (honey teaspoon)  (None)  Pharyngeal - Honey Cup (None)  Penetration/Aspiration details (honey cup) (None)  Pharyngeal - Honey Syringe (None)  Penetration/Aspiration details (honey syringe) (None)  Pharyngeal - Nectar Teaspoon (None)  Penetration/Aspiration details (nectar teaspoon) (None)  Pharyngeal - Nectar Cup (None)  Penetration/Aspiration details (nectar cup) (None)  Pharyngeal - Nectar Straw (None)  Penetration/Aspiration details (nectar straw) (None)  Pharyngeal - Nectar Syringe (None)  Penetration/Aspiration details (nectar syringe) (None)  Pharyngeal - Ice Chips (None)  Penetration/Aspiration details (ice chips) (None)  Pharyngeal - Thin Teaspoon (None)  Penetration/Aspiration details (thin teaspoon) (None)  Pharyngeal - Thin Cup (None)  Penetration/Aspiration details (thin cup) (None)  Pharyngeal - Thin Straw (None)  Penetration/Aspiration details (thin straw) (None)  Pharyngeal - Thin Syringe (None)  Penetration/Aspiration details (thin syringe') (None)  Pharyngeal - Puree (None)  Penetration/Aspiration details (puree) (None)  Pharyngeal - Mechanical Soft (None)  Penetration/Aspiration details (mechanical soft) (None)  Pharyngeal - Regular (None)  Penetration/Aspiration details (regular) (None)  Pharyngeal - Multi-consistency (None)  Penetration/Aspiration details (multi-consistency) (None)  Pharyngeal - Pill (None)  Penetration/Aspiration details (pill) (None)  Pharyngeal Comment (None)      CHL IP CERVICAL ESOPHAGEAL PHASE 09/17/2015  Cervical Esophageal Phase (No Data)  Pudding Teaspoon (None)  Pudding Cup (None)  Honey Teaspoon (None)  Honey Cup (None)  Honey Straw (None)  Nectar Teaspoon (None)  Nectar Cup (None)  Nectar Straw (None)  Nectar Sippy Cup (None)  Thin Teaspoon (None)  Thin Cup (None)  Thin Straw (None)  Thin Sippy Cup (None)  Cervical Esophageal Comment (None)    No flowsheet data found.        Harlon Ditty, MA CCC-SLP [phone]  Claudine Mouton 09/17/2015, 1:14 PM    Dg C-arm 1-60  Min  09/06/2015   CLINICAL DATA:  ACDF C2-3 and C4-5  EXAM: DG C-ARM 61-120 MIN; DG CERVICAL SPINE - 1 VIEW  COMPARISON:  None.  FLUOROSCOPY TIME:  10 seconds  One image  FINDINGS: Single lateral intraoperative fluoroscopic image is provided of the cervical spine. Anterior cervical fusion with interbody spacer device at C2-3 and C4-5.  IMPRESSION: ACDF C2-3 and C4-5.   Electronically Signed   By: Elige Ko   On: 09/06/2015 12:43    Jeoffrey Massed, MD  [facility] Pager:[phone]  If 7PM-7AM, please contact night-coverage [email or web address] Password [email or web address] 09/22/2015, 11:27 AM   LOS: 16 days

## 2015-09-22 NOTE — Progress Notes (Signed)
Physical Therapy Treatment Patient Details Name: [name] MRN: [id number] DOB: Dec 31, 1929 Today's Date: 09/22/2015    History of Present Illness pt is an 81 y/o male admitted with servere stenosis in cervical and lumbar areas with assoc bil UE and LE's, s/p C23 and C45 fusions and decompressions AND one level lumbar lami and foraminectomy.  After sx noted pt with significant L hemiparesis with some inattension and R gaze preference.  MRI shows R large MCA and smaller PCA infarcts; ?L infarct    PT Comments    Pt with large watery stools throughout session requiring total A for bed mobility and peri care.  Pt fatigues quickly with multiple times rolling.  RN aware of stools.  Will continue to follow.    Follow Up Recommendations  SNF     Equipment Recommendations  None recommended by PT    Recommendations for Other Services       Precautions / Restrictions Precautions Precautions: Fall;Cervical;Back Precaution Booklet Issued: No Precaution Comments: pt not retaining education on back precautions.   Required Braces or Orthoses: Cervical Brace Cervical Brace: Hard collar;At all times Restrictions Weight Bearing Restrictions: No    Mobility  Bed Mobility Overal bed mobility: Needs Assistance;+2 for physical assistance Bed Mobility: Rolling Rolling: Total assist;+2 for physical assistance         General bed mobility comments: pt with copious amount of watery BM requiring multiple times rolling to perform peri hygiene.  pt needs hand over hand cueing for technique and attending to task.    Transfers                    Ambulation/Gait                 Stairs            Wheelchair Mobility    Modified Rankin (Stroke Patients Only) Modified Rankin (Stroke Patients Only) Pre-Morbid Rankin Score: Moderate disability Modified Rankin: Severe disability     Balance                                    Cognition  Arousal/Alertness: Lethargic Behavior During Therapy: Flat affect Overall Cognitive Status: Impaired/Different from baseline Area of Impairment: Orientation;Attention;Following commands;Memory;Problem solving;Awareness;Safety/judgement Orientation Level: Disoriented to;Time Current Attention Level: Selective Memory: Decreased recall of precautions;Decreased short-term memory Following Commands: Follows one step commands inconsistently Safety/Judgement: Decreased awareness of safety;Decreased awareness of deficits Awareness: Emergent Problem Solving: Slow processing;Decreased initiation;Difficulty sequencing;Requires verbal cues      Exercises      General Comments        Pertinent Vitals/Pain Pain Assessment: Faces Faces Pain Scale: No hurt    Home Living                      Prior Function            PT Goals (current goals can now be found in the care plan section) Acute Rehab PT Goals Patient Stated Goal: None stated PT Goal Formulation: With patient Time For Goal Achievement: 09/23/15 Potential to Achieve Goals: Good Progress towards PT goals: Progressing toward goals    Frequency  Min 3X/week    PT Plan Current plan remains appropriate    Co-evaluation             End of Session Equipment Utilized During Treatment: Cervical collar Activity Tolerance: Patient limited by fatigue Patient  left: in bed;with call bell/phone within reach;with bed alarm set;with nursing/sitter in room     Time: 1155-1231 PT Time Calculation (min) (ACUTE ONLY): 36 min  Charges:  $Therapeutic Activity: 23-37 mins                    G CodesSunny Schlein, [place] [phone] 09/22/2015, 1:39 PM

## 2015-09-23 ENCOUNTER — Encounter (HOSPITAL_COMMUNITY): Payer: Self-pay | Admitting: Radiology

## 2015-09-23 DIAGNOSIS — I4891 Unspecified atrial fibrillation: Secondary | ICD-10-CM

## 2015-09-23 LAB — BASIC METABOLIC PANEL
ANION GAP: 6 (ref 5–15)
BUN: 23 mg/dL — ABNORMAL HIGH (ref 6–20)
CALCIUM: 7.9 mg/dL — AB (ref 8.9–10.3)
CO2: 28 mmol/L (ref 22–32)
Chloride: 99 mmol/L — ABNORMAL LOW (ref 101–111)
Creatinine, Ser: 0.84 mg/dL (ref 0.61–1.24)
Glucose, Bld: 179 mg/dL — ABNORMAL HIGH (ref 65–99)
Potassium: 4.1 mmol/L (ref 3.5–5.1)
Sodium: 133 mmol/L — ABNORMAL LOW (ref 135–145)

## 2015-09-23 LAB — GLUCOSE, CAPILLARY
GLUCOSE-CAPILLARY: 113 mg/dL — AB (ref 65–99)
GLUCOSE-CAPILLARY: 179 mg/dL — AB (ref 65–99)
Glucose-Capillary: 124 mg/dL — ABNORMAL HIGH (ref 65–99)
Glucose-Capillary: 143 mg/dL — ABNORMAL HIGH (ref 65–99)
Glucose-Capillary: 153 mg/dL — ABNORMAL HIGH (ref 65–99)
Glucose-Capillary: 162 mg/dL — ABNORMAL HIGH (ref 65–99)
Glucose-Capillary: 173 mg/dL — ABNORMAL HIGH (ref 65–99)

## 2015-09-23 MED ORDER — ASPIRIN 81 MG PO CHEW
81.0000 mg | CHEWABLE_TABLET | Freq: Every day | ORAL | Status: DC
  Administered 2015-09-23 – 2015-09-24 (×2): 81 mg via ORAL
  Filled 2015-09-23 (×2): qty 1

## 2015-09-23 MED ORDER — JEVITY 1.2 CAL PO LIQD
1000.0000 mL | ORAL | Status: DC
  Administered 2015-09-24 – 2015-09-26 (×4): 1000 mL
  Filled 2015-09-23 (×6): qty 1000
  Filled 2015-09-23: qty 237
  Filled 2015-09-23 (×3): qty 1000

## 2015-09-23 NOTE — Progress Notes (Signed)
Overall stable. No new problems or issues. Patient denies pain.  Afebrile. Vitals are stable. Urine output good.  Awake and alert. Answers questions appropriately. Strength on right side good. Left hemiplegia unchanged. Wounds clean and dry.  Awaiting PEG placement tomorrow. Possible SNIF unit placement early next week. Continue current care.

## 2015-09-23 NOTE — Progress Notes (Signed)
Contrast given and tube feed restarted. Will stop at midnight. Continuing to monitor. Jobanny Mavis, [place] Scrape, RN

## 2015-09-23 NOTE — Progress Notes (Signed)
Chief Complaint: Patient was seen in consultation today for perc gastrostomy tube placement at the request of the [facility]  Referring Physician(s): Dr. Butler Denmark  History of Present Illness: [name] is a 81 y.o. male who underwent cervical decompression and laminectomy a few weeks ago, but developed an acute MCA post operatively. He has significant dysphagia that is not improving. All other medical issues are improving or stable. IR is asked to place G-tube for continued TF needs. Pt was placed on Plavix and ASA but at our request, Plavix has been held since 10/2. Pt seen today in preparation for G-tube placement tomorrow. No family at bedside Chart, PMHx, meds, labs, imaging reviewed. Pt currently receiving TF via DHT that was passed beyond the ligament of Treitz.   Past Medical History  Diagnosis Date  . Hypertension   . Diabetes mellitus without complication (HCC)   . DDD (degenerative disc disease)   . Vertigo   . High cholesterol   . Peripheral vascular disease (HCC)   . Pneumonia   . History of kidney stones   . Hx of gallstones   . GERD (gastroesophageal reflux disease)   . Headache   . Wears dentures     Past Surgical History  Procedure Laterality Date  . Cholecystectomy    . Skin graft      Left leg  . Vein surgery    . Diagnostic laparoscopy    . Multiple tooth extractions    . Vascular surgery    . Eye surgery Bilateral   . Anterior cervical decomp/discectomy fusion N/A 09/06/2015    Procedure: Anterior Cervical Decompression Fusion Cervical two,Cervical three,Cervical four-,Cervical five ;  Surgeon: Julio Sicks, MD;  Location: [facility];  Service: Neurosurgery;  Laterality: N/A;  . Lumbar laminectomy/decompression microdiscectomy N/A 09/06/2015    Procedure: Lumbar three-four  decompresssive laminectomy  ;  Surgeon: Julio Sicks, MD;  Location: [facility];  Service: Neurosurgery;  Laterality: N/A;    Allergies: Penicillins and  Adhesive  Medications:  Current facility-administered medications:  .  acetaminophen (TYLENOL) tablet 650 mg, 650 mg, Oral, Q4H PRN, 650 mg at 09/23/15 1031 **OR** acetaminophen (TYLENOL) suppository 650 mg, 650 mg, Rectal, Q4H PRN, Julio Sicks, MD, 650 mg at 09/13/15 0155 .  antiseptic oral rinse (CPC / CETYLPYRIDINIUM CHLORIDE 0.05%) solution 7 mL, 7 mL, Mouth Rinse, q12n4p, Julio Sicks, MD, 7 mL at 09/23/15 1316 .  aspirin chewable tablet 81 mg, 81 mg, Oral, Daily, Julio Sicks, MD, 81 mg at 09/23/15 1031 .  chlorhexidine (PERIDEX) 0.12 % solution 15 mL, 15 mL, Mouth Rinse, BID, Julio Sicks, MD, 15 mL at 09/23/15 1031 .  cyclobenzaprine (FLEXERIL) tablet 10 mg, 10 mg, Oral, TID PRN, Julio Sicks, MD, 10 mg at 09/13/15 2123 .  feeding supplement (JEVITY 1.2 CAL) liquid 1,000 mL, 1,000 mL, Per Tube, Continuous, Arlyss Gandy, RD, Last Rate: 60 mL/hr at 09/21/15 2334, 1,000 mL at 09/21/15 2334 .  feeding supplement (PRO-STAT SUGAR FREE 64) liquid 30 mL, 30 mL, Per Tube, BID, Heather C Pitts, RD, 30 mL at 09/23/15 1031 .  folic acid (FOLVITE) tablet 1 mg, 1 mg, Oral, Daily, Julio Sicks, MD, 1 mg at 09/23/15 1031 .  heparin injection 5,000 Units, 5,000 Units, Subcutaneous, 3 times per day, Marvel Plan, MD, 5,000 Units at 09/23/15 0507 .  insulin aspart (novoLOG) injection 0-15 Units, 0-15 Units, Subcutaneous, 6 times per day, Rahul P Desai, PA-C, 3 Units at 09/23/15 1315 .  iohexol (OMNIPAQUE)  300 MG/ML solution 50 mL, 50 mL, Other, Once PRN, Medication Radiologist, MD, 20 mL at 09/10/15 1221 .  iohexol (OMNIPAQUE) 300 MG/ML solution 50 mL, 50 mL, Other, Once PRN, Medication Radiologist, MD, 20 mL at 09/15/15 1442 .  loperamide (IMODIUM) 1 MG/5ML solution 2-4 mg, 2-4 mg, Per NG tube, PRN, Julio Sicks, MD, 2 mg at 09/17/15 1137 .  menthol-cetylpyridinium (CEPACOL) lozenge 3 mg, 1 lozenge, Oral, PRN **OR** phenol (CHLORASEPTIC) mouth spray 1 spray, 1 spray, Mouth/Throat, PRN, Julio Sicks, MD .  metoprolol  (LOPRESSOR) injection 5 mg, 5 mg, Intravenous, Q6H PRN, Othella Boyer, MD, 5 mg at 09/15/15 1208 .  metoprolol tartrate (LOPRESSOR) 25 mg/10 mL oral suspension 50 mg, 50 mg, Oral, BID, Maretta Bees, MD, 50 mg at 09/23/15 1031 .  midazolam (VERSED) injection 1 mg, 1 mg, Intravenous, Q15 min PRN, Rahul P Desai, PA-C .  midazolam (VERSED) injection 1 mg, 1 mg, Intravenous, Q2H PRN, Rahul P Desai, PA-C, 1 mg at 09/08/15 0900 .  ondansetron (ZOFRAN) injection 4 mg, 4 mg, Intravenous, Q4H PRN, Julio Sicks, MD .  pantoprazole (PROTONIX) injection 40 mg, 40 mg, Intravenous, QHS, Julio Sicks, MD, 40 mg at 09/22/15 2153 .  rosuvastatin (CRESTOR) tablet 10 mg, 10 mg, Oral, Daily, Julio Sicks, MD, 10 mg at 09/23/15 1031    History reviewed. No pertinent family history.  Social History   Social History  . Marital Status: Widowed    Spouse Name: N/A  . Number of Children: 1  . Years of Education: GED   Occupational History  . Retired    Social History Main Topics  . Smoking status: Former Games developer  . Smokeless tobacco: Never Used     Comment: Quit smoking in 1979  . Alcohol Use: No  . Drug Use: No  . Sexual Activity: Not Asked   Other Topics Concern  . None   Social History Narrative   Lives at home with a caregiver.   Right-handed.   Occasional caffeine use.    Review of Systems: A 12 point ROS discussed and pertinent positives are indicated in the HPI above.  All other systems are negative.  Review of Systems  Vital Signs: BP 136/64 mmHg  Pulse 108  Temp(Src) 97.7 F (36.5 C) (Oral)  Resp 20  Ht  (1.854 m)  Wt 173 lb 8 oz (78.7 kg)  BMI 22.90 kg/m2  SpO2 98%  Physical Exam  Constitutional: He appears well-developed. No distress.  HENT:  Mouth/Throat: Oropharynx is clear and moist.  Left nare dobhoff tube intact  Neck:  Neck collar on, no tracheal deviation.  Cardiovascular: Normal rate, regular rhythm and normal heart sounds.   Pulmonary/Chest: Effort normal  and breath sounds normal. No respiratory distress.  Abdominal: Soft. Bowel sounds are normal. He exhibits no distension. There is no tenderness.  Neurological:  Pt awakens and alert but drifts off to sleep. Not sure he understood our conversation today  Skin: He is not diaphoretic.    Mallampati Score:  MD Evaluation Airway: WNL Heart: WNL Abdomen: WNL Chest/ Lungs: WNL ASA  Classification: 3 Mallampati/Airway Score: Two   Labs:  CBC:  Recent Labs  09/11/15 2315 09/13/15 0230 09/14/15 0532 09/22/15 0602  WBC 10.0 8.1 7.3 7.6  HGB 11.9* 11.0* 10.6* 10.9*  HCT 34.3* 32.7* 31.8* 33.8*  PLT 95* 106* 130* 153    COAGS: No results for input(s): INR, APTT in the last 8760 hours.  BMP:  Recent Labs  09/15/15 0726  09/16/15 0405 09/22/15 0602 09/23/15 0614  NA 135 136 133* 133*  K 3.4* 4.2 5.3* 4.1  CL 102 104 101 99*  CO2 GLUCOSE 129* 206* 138* 179*  BUN 14 16 22* 23*  CALCIUM 7.9* 8.0* 8.1* 7.9*  CREATININE 0.74 0.70 0.80 0.84  GFRNONAA >60 >60 >60 >60  GFRAA >60 >60 >60 >60    LIVER FUNCTION TESTS:  Recent Labs  09/06/15 1635 09/11/15 1959 09/15/15 0726  BILITOT 1.3* 1.7* 1.7*  AST 22 24 33  ALT 12* 18 23  ALKPHOS 73 68 73  PROT 5.4* 5.3* 5.4*  ALBUMIN 2.8* 2.3* 1.9*    Assessment and Plan: Dysphagia post CVA For Perc G-tube. Imaging looks favorable but will give barium via NG tonight to assist with fluoroscopic visualization tomorrow. Stop TF at MN Hold 10/7 sub-q heparin until after procedure. Discussed procedure with pt but also attempted to contact grandson, but there was no answer. Will need to contact family for consent prior to procedure as I don't feel pt is appropriate to consent for himself    Signed: Brayton El 09/23/2015, 1:20 PM   I spent a total of 20 Minutes in face to face in clinical consultation, greater than 50% of which was counseling/coordinating care for gastrostomy tube placement.

## 2015-09-23 NOTE — Progress Notes (Signed)
[facility] consultation follow-up progress note  PATIENT DETAILS Name: [name] Age: 81 y.o. Sex: male Date of Birth: 29-Apr-1930 Admit Date: 09/06/2015 Admitting Physician Julio Sicks, MD ZOX:[id number], Chancy Hurter, MD  Brief narrative:  [name] is a 81 y.o. male with history of HTN, DM, hypercholesterolemia, PAD, GERD, who presented to [name] on 9/19 for surgery to remedy progressive bilateral upper and lower extremity numbness from critical cervical and lumbar stenosis. Patient underwent anterior cervical decompression and lumbar decompression with laminectomy on 9/19. Postoperatively, patient remainrf on the ventilator in the intensive care unit. Unfortunately patient was noted to have dense left-sided hemiparesis, and also some concern for angioedema. Further workup demonstrated acute ischemia in the right MCA territory. Hospital course was further complicated by development of atrial fibrillation, presumed aspiration pneumonia and UTI. He unfortunately also developed significant dysphagia, and remains nothing by mouth with a panda tube in place. Patient was subsequently transferred to the medical surgical unit on 9/29, and [facility] was consulted to manage patient's ongoing medical issues.   Significant events during this hospital stay: 9/19 >>to OR for C2-C3 and C4-5 anterior anterior cervical discectomy with interbody fusion, anterior instrumentation at C2-3 and C4-5, L3-4 decompressive laminectomy with bilateral L3 and l4 foraminotomies. Returned to ICU on vent. 9/19>>Noted to have left hemiparesis-CT head -Suggestive of rt MCA infarct. 9/19 >> 9/21 OETT  9/25>> afib with rvr cards consult  Subjective: No chest pain/SOB-awake alert-still neglecting left side  Assessment/Plan: Principal Problem: Spinal stenosis in cervical/Lumbar region:s/p  C2-C3 and C4-5 anterior anterior cervical discectomy with interbody fusion, anterior  instrumentation at C2-3 and C4-5, L3-4 decompressive laminectomy with bilateral L3 and l4 foraminotomies.Cervical collar remains in place. Neurosurgery following  Active Problems: Acute Large right MCA CVA with scattered right PCA as well as punctate left MCA and right SCA : Continues to have dense left hemiparesis and hemi-neglect. Echo showed EF around 60-65%, carotid Doppler showed Bilateral ICA origin stenosis of up to 60% in the neck. Given atrial fibrillation, will need to start NOAC's-neurology recommended to wait 2 weeks before initiating anticoagulation as significant risk for hemorrhagic transition. Tentative NOAC start date was 10/5-however now will be started after peg tube is placed. Previously on both aspirin and Plavix, however since peg tube being planned, Plavix discontinued and now only on aspirin.  Patient will need outpatient vascular surgery follow-up for carotid artery Doppler monitoring and CEA as indicated. Note LDL 41 (goal<70), A1c 6.1  Atrial fibrillation with UJW:JXBJYN intermittent episodes of RVR-increase Metoprolol to 75 mg BID. See above regarding anticoagulation. Cardiology following  Escherichia coli UTI: Afebrile, no leukocytosis. Completed Bactrim-on 10/4  Presumed aspiration pneumonia: Afebrile, no leukocytosis, completed course of Levaquin  Oral thrush:Completed course of diflucan on 10/4   Dysphagia: Multifactorial-suspect predominantly secondary to cervical surgery/hardware position-with contributions critical illness, and possible CVA. Has had numerous SLP evaluation including MBS-recommendations are to continue to remain nothing by mouth. Plans are to place a PEG tube later this week. Family and patient both aware that he will continue to remain at aspiration risk with or without PEG tube.  Hypokalemia: Resolved  Hyperkalemia:resolved  Dyslipidemia: Continue statin-LDL 41 (goal<70)  Type 2 diabetes: CBGs stable, continue suicide. Resume metformin on  discharge. A1c 6.1  Hypertension: Controlled-continue metoprolol. Follow and adjust accordingly  Bilateral carotid artery stenosis: See above. Follow-up with vascular surgery on discharge  Tobacco abuse disorder: Will need ongoing  counseling.  Disposition: Remain inpatient-SNF on 10/8 once peg tube placed  Antimicrobial agents  See below  Anti-infectives    Start     Dose/Rate Route Frequency Ordered Stop   09/15/15 1015  fluconazole (DIFLUCAN) tablet 100 mg  Status:  Discontinued     100 mg Oral Daily 09/15/15 1007 09/15/15 1008   09/15/15 1015  fluconazole (DIFLUCAN) tablet 100 mg     100 mg Per Tube Daily 09/15/15 1008 09/23/15 0959   09/15/15 1000  levofloxacin (LEVAQUIN) IVPB 750 mg     750 mg 100 mL/hr over 90 Minutes Intravenous Every 24 hours 09/14/15 1157 09/18/15 1202   09/14/15 1300  sulfamethoxazole-trimethoprim (BACTRIM,SEPTRA) 200-40 MG/5ML suspension 20 mL     20 mL Per Tube Every 12 hours 09/14/15 1201 09/22/15 0959   09/12/15 1000  levofloxacin (LEVAQUIN) IVPB 500 mg  Status:  Discontinued     500 mg 100 mL/hr over 60 Minutes Intravenous Every 24 hours 09/12/15 0954 09/14/15 1157   09/10/15 1800  vancomycin (VANCOCIN) 1,250 mg in sodium chloride 0.9 % 250 mL IVPB  Status:  Discontinued     1,250 mg 166.7 mL/hr over 90 Minutes Intravenous Every 24 hours 09/09/15 2024 09/11/15 1122   09/06/15 1830  vancomycin (VANCOCIN) IVPB 1000 mg/200 mL premix  Status:  Discontinued     1,000 mg 200 mL/hr over 60 Minutes Intravenous Every 12 hours 09/06/15 1534 09/09/15 2024   09/06/15 0715  bacitracin 50,000 Units in sodium chloride irrigation 0.9 % 500 mL irrigation  Status:  Discontinued       As needed 09/06/15 0900 09/06/15 1300   09/06/15 0644  vancomycin (VANCOCIN) IVPB 1000 mg/200 mL premix     1,000 mg 200 mL/hr over 60 Minutes Intravenous On call to O.R. 09/06/15 1610 09/06/15 0852      DVT Prophylaxis: Prophylactic Heparin   Code Status: Full code   Family  Communication None at bedside  Procedures: 9/19 >>to OR for C2-C3 and C4-5 anterior anterior cervical discectomy with interbody fusion, anterior instrumentation at C2-3 and C4-5, L3-4 decompressive laminectomy with bilateral L3 and l4 foraminotomies. 9/19 >> 9/21 OETT   CONSULTS:  cardiology, pulmonary/intensive care and Palliative  Time spent 30 minutes-Greater than 50% of this time was spent in counseling, explanation of diagnosis, planning of further management, and coordination of care.  MEDICATIONS: Scheduled Meds: . antiseptic oral rinse  7 mL Mouth Rinse q12n4p  . aspirin  81 mg Oral Daily  . chlorhexidine  15 mL Mouth Rinse BID  . feeding supplement (PRO-STAT SUGAR FREE 64)  30 mL Per Tube BID  . folic acid  1 mg Oral Daily  . heparin subcutaneous  5,000 Units Subcutaneous 3 times per day  . insulin aspart  0-15 Units Subcutaneous 6 times per day  . metoprolol tartrate  50 mg Oral BID  . pantoprazole (PROTONIX) IV  40 mg Intravenous QHS  . rosuvastatin  10 mg Oral Daily   Continuous Infusions: . feeding supplement (JEVITY 1.2 CAL) 1,000 mL (09/21/15 2334)   PRN Meds:.acetaminophen **OR** acetaminophen, cyclobenzaprine, iohexol, iohexol, loperamide, menthol-cetylpyridinium **OR** phenol, metoprolol, midazolam, midazolam, ondansetron (ZOFRAN) IV    PHYSICAL EXAM: Vital signs in last 24 hours: Filed Vitals:   09/23/15 0100 09/23/15 0500 09/23/15 0606 09/23/15 1016  BP: 136/62  119/58 136/64  Pulse: 115  108 108  Temp: 98.4 F (36.9 C)  98.1 F (36.7 C) 97.7 F (36.5 C)  TempSrc: Oral  Oral Oral  Resp: 20  20 20  Height:      Weight:  78.7 kg (173 lb 8 oz)    SpO2: 97%  97% 98%    Weight change: -0.9 kg (-1 lb 15.8 oz) Filed Weights   09/21/15 0500 09/22/15 0426 09/23/15 0500  Weight: 80.1 kg (176 lb 9.4 oz) 79.6 kg (175 lb 7.8 oz) 78.7 kg (173 lb 8 oz)   Body mass index is 22.9 kg/(m^2).   Gen Exam: Awake and alert. Panda tube in place, cervical collar in  place Chest: B/L Clear anteriorly CVS: S1 S2 Regular Abdomen: soft, BS +, non tender, non distended.  Extremities: no edema, lower extremities warm to touch. Neurologic: Dense left hemiparesis-with left hemi-neglect. Skin: No Rash.   Wounds: N/A.    Intake/Output from previous day:  Intake/Output Summary (Last 24 hours) at 09/23/15 1246 Last data filed at 09/23/15 0606  Gross per 24 hour  Intake      0 ml  Output    600 ml  Net   -600 ml     LAB RESULTS: CBC  Recent Labs Lab 09/22/15 0602  WBC 7.6  HGB 10.9*  HCT 33.8*  PLT 153  MCV 93.4  MCH 30.1  MCHC 32.2  RDW 16.4*    Chemistries   Recent Labs Lab 09/22/15 0602 09/23/15 0614  NA 133* 133*  K 5.3* 4.1  CL 101 99*  CO2 26 28  GLUCOSE 138* 179*  BUN 22* 23*  CREATININE 0.80 0.84  CALCIUM 8.1* 7.9*    CBG:  Recent Labs Lab 09/22/15 2021 09/23/15 0016 09/23/15 0430 09/23/15 0819 09/23/15 1206  GLUCAP 157* 124* 173* 153* 162*    GFR Estimated Creatinine Clearance: 74.2 mL/min (by C-G formula based on Cr of 0.84).  Coagulation profile No results for input(s): INR, PROTIME in the last 168 hours.  Cardiac Enzymes No results for input(s): CKMB, TROPONINI, MYOGLOBIN in the last 168 hours.  Invalid input(s): CK  Invalid input(s): POCBNP No results for input(s): DDIMER in the last 72 hours. No results for input(s): HGBA1C in the last 72 hours. No results for input(s): CHOL, HDL, LDLCALC, TRIG, CHOLHDL, LDLDIRECT in the last 72 hours. No results for input(s): TSH, T4TOTAL, T3FREE, THYROIDAB in the last 72 hours.  Invalid input(s): FREET3 No results for input(s): VITAMINB12, FOLATE, FERRITIN, TIBC, IRON, RETICCTPCT in the last 72 hours. No results for input(s): LIPASE, AMYLASE in the last 72 hours.  Urine Studies No results for input(s): UHGB, CRYS in the last 72 hours.  Invalid input(s): UACOL, UAPR, USPG, UPH, UTP, UGL, UKET, UBIL, UNIT, UROB, ULEU, UEPI, UWBC, URBC, UBAC, CAST, UCOM,  BILUA  MICROBIOLOGY: No results found for this or any previous visit (from the past 240 hour(s)).  RADIOLOGY STUDIES/RESULTS: Dg Cervical Spine 1 View  09/06/2015   CLINICAL DATA:  ACDF C2-3 and C4-5  EXAM: DG C-ARM 61-120 MIN; DG CERVICAL SPINE - 1 VIEW  COMPARISON:  None.  FLUOROSCOPY TIME:  10 seconds  One image  FINDINGS: Single lateral intraoperative fluoroscopic image is provided of the cervical spine. Anterior cervical fusion with interbody spacer device at C2-3 and C4-5.  IMPRESSION: ACDF C2-3 and C4-5.   Electronically Signed   By: Elige Ko   On: 09/06/2015 12:43   Dg Abd 1 View  09/15/2015   CLINICAL DATA:  Feeding tube placement  EXAM: ABDOMEN - 1 VIEW  COMPARISON:  09/13/2015  TECHNIQUE: Twelve French feeding tube placed under fluoroscopy by Delice Bison Dingus RT-R.  20 mL of  Omnipaque 300 was utilized to confirm placement.  FLUOROSCOPY TIME:  5 minutes 12 seconds  FINDINGS: Contrast opacifies second third portions the duodenum as well as RIGHT proximal jejunal loop.  Feeding tube is placed with the tip at/beyond ligament of Treitz.  IMPRESSION: Placement of feeding tube with tip at/beyond ligament of Treitz.   Electronically Signed   By: Ulyses Southward M.D.   On: 09/15/2015 14:49   Dg Abd 1 View  09/10/2015   CLINICAL DATA:  Nasoenteric feeding tube placement by RT  EXAM: ABDOMEN - 1 VIEW  COMPARISON:  06/15/2006  FINDINGS: Feeding tube extends to the ligament of Treitz, confirmed with contrast injection.  IMPRESSION: Feeding tube placement to ligament of Treitz.   Electronically Signed   By: Corlis Leak M.D.   On: 09/10/2015 13:26   Ct Head Wo Contrast  09/06/2015   CLINICAL DATA:  Left-sided weakness following cervical surgery  EXAM: CT HEAD WITHOUT CONTRAST  TECHNIQUE: Contiguous axial images were obtained from the base of the skull through the vertex without intravenous contrast.  COMPARISON:  06/27/2013  FINDINGS: The bony calvarium is intact. No gross soft tissue abnormality is seen. Mild  atrophic changes are noted. There is some generalized decreased attenuation identified in the distribution of the right middle cerebral artery suggestive of a early ischemic event. MRI may be helpful further evaluation. No other focal areas of ischemia or hemorrhage are noted.  IMPRESSION: Chronic atrophic changes.  Generalized decreased attenuation in the distribution of the right middle cerebral artery suggestive of early ischemia. MRI may be helpful for further evaluation.  These results were called by telephone at the time of interpretation on 09/06/2015 at 5:34 pm to [facility], the pts nurse, who verbally acknowledged these results.   Electronically Signed   By: Alcide Clever M.D.   On: 09/06/2015 17:37   Mr Shirlee Latch Wo Contrast  09/08/2015   CLINICAL DATA:  81 year old male with left side weakness following cervical spine and lumbar surgery. Initial encounter.  EXAM: MRI HEAD WITHOUT CONTRAST AND WITH CONTRAST  MRA HEAD WITHOUT CONTRAST  MRA NECK WITHOUT AND WITH CONTRAST  TECHNIQUE: Multiplanar, multiecho pulse sequences of the brain and surrounding structures were obtained without intravenous contrast. Angiographic images of the Circle of Willis were obtained using MRA technique without intravenous contrast. Angiographic images of the neck were obtained using MRA technique without and with intravenous contrast. Carotid stenosis measurements (when applicable) are obtained utilizing NASCET criteria, using the distal internal carotid diameter as the denominator.  CONTRAST:  20mL MULTIHANCE GADOBENATE DIMEGLUMINE 529 MG/ML IV SOLN  COMPARISON:  Head CT without contrast 09/06/2015. Preoperative Cervical spine MRI 08/12/2015  FINDINGS: MRI HEAD FINDINGS  Confluent restricted diffusion throughout most of the right hemisphere. Near complete involvement of the right MCA territory. Right MCA/PCA watershed involvement extending to the lateral occipital pole. Scattered involvement elsewhere in the right PCA territory.  Some of the right temporal lobe also is spared.  Scattered small areas of restricted diffusion also in the left MCA territory, an the right superior cerebellar artery territory.  Major intracranial vascular flow voids are preserved, see MRA findings below.  Confluent cytotoxic edema in the right hemisphere. Areas of petechial hemorrhage in the anterior and posterior right MCA territories. Mass effect on the right lateral ventricle. Mild leftward midline shift of 3 mm. No ventriculomegaly. Basilar cisterns are patent. No extra-axial or intraventricular hemorrhage. Following contrast, no significant post ischemic enhancement.  Minimal other nonspecific cerebral white matter T2 and FLAIR  hyperintensity. There is a small chronic lacunar infarct in the right PICA territory (series 6, image 5).  Visible internal auditory structures appear normal. Trace mastoid fluid. Mild paranasal sinus mucosal thickening. The patient is intubated. Trace fluid in the oral cavity. Postoperative changes to the globes. Negative scalp soft tissues.  Hardware susceptibility artifact in the cervical spine. Chronic spinal Stenosis with mass effect on the spinal cord at C2-C3 (series 3, image 13).  MRA NECK FINDINGS  Precontrast time-of-flight imaging. A antegrade flow in both carotid and vertebral arteries in the neck and to the skullbase.  Post-contrast neck MRA images. Three vessel arch configuration. No great vessel origin stenosis.  Negative right CCA proximal to the right carotid bifurcation. At the right ICA origin there is irregularity and stenosis of up to 60 % with respect to the distal vessel (series 1302, image 84 and series 16109, image 5). The cervical right ICA is patent and otherwise appears negative to the skullbase.  Negative left CCA. Irregularity and stenosis at the left ICA origin also measuring up to 60 % with respect to the distal vessel and best seen on series 1302, image 71. The left ICA remains patent and is otherwise  negative to the skullbase.  No proximal subclavian artery stenosis. Normal left vertebral artery origin. Mild if any stenosis at the right vertebral artery origin. The left vertebral artery is mildly dominant throughout.  MRA HEAD FINDINGS  Antegrade flow in the posterior circulation. Dominant distal left vertebral artery. Normal right PICA origin. Dominant appearing left AICA with a patent origin. Patent vertebrobasilar junction. No basilar stenosis. Normal SCA and PCA origins, fetal type on the right. Both posterior communicating arteries are present. Bilateral PCA branches are within normal limits.  Antegrade flow in both ICA siphons. No siphon stenosis. Ophthalmic and posterior communicating artery origins are within normal limits. Small probable atherosclerotic pseudo lesion of the left ICA siphon in the superior hypophyseal region (series 505, image 11). Patent carotid termini. Normal MCA and ACA origins.  Anterior communicating artery and visualized ACA branches are within normal limits. Left MCA M1 segment and bifurcation are within normal limits. No left MCA branch occlusion identified. Right MCA M1 segment and bifurcation are within normal limits. No major right MCA branch occlusion identified.  IMPRESSION: 1. 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 2. Scattered small left MCA and occasional right SCA acute infarcts. 3. Bilateral ICA origin stenosis of up to 60% in the neck. No emergent large vessel occlusion. No intracranial stenosis or MCA branch occlusion identified. 4. Chronic spinal stenosis with spinal cord mass effect at C2-C3. New upper cervical fusion hardware.   Electronically Signed   By: Odessa Fleming M.D.   On: 09/08/2015 10:20   Mr Angiogram Neck W Wo Contrast  09/08/2015   CLINICAL DATA:  81 year old male with left side weakness following cervical spine and lumbar  surgery. Initial encounter.  EXAM: MRI HEAD WITHOUT CONTRAST AND WITH CONTRAST  MRA HEAD WITHOUT CONTRAST  MRA NECK WITHOUT AND WITH CONTRAST  TECHNIQUE: Multiplanar, multiecho pulse sequences of the brain and surrounding structures were obtained without intravenous contrast. Angiographic images of the Circle of Willis were obtained using MRA technique without intravenous contrast. Angiographic images of the neck were obtained using MRA technique without and with intravenous contrast. Carotid stenosis measurements (when applicable) are obtained utilizing NASCET criteria, using the distal internal carotid diameter  as the denominator.  CONTRAST:  20mL MULTIHANCE GADOBENATE DIMEGLUMINE 529 MG/ML IV SOLN  COMPARISON:  Head CT without contrast 09/06/2015. Preoperative Cervical spine MRI 08/12/2015  FINDINGS: MRI HEAD FINDINGS  Confluent restricted diffusion throughout most of the right hemisphere. Near complete involvement of the right MCA territory. Right MCA/PCA watershed involvement extending to the lateral occipital pole. Scattered involvement elsewhere in the right PCA territory. Some of the right temporal lobe also is spared.  Scattered small areas of restricted diffusion also in the left MCA territory, an the right superior cerebellar artery territory.  Major intracranial vascular flow voids are preserved, see MRA findings below.  Confluent cytotoxic edema in the right hemisphere. Areas of petechial hemorrhage in the anterior and posterior right MCA territories. Mass effect on the right lateral ventricle. Mild leftward midline shift of 3 mm. No ventriculomegaly. Basilar cisterns are patent. No extra-axial or intraventricular hemorrhage. Following contrast, no significant post ischemic enhancement.  Minimal other nonspecific cerebral white matter T2 and FLAIR hyperintensity. There is a small chronic lacunar infarct in the right PICA territory (series 6, image 5).  Visible internal auditory structures appear normal.  Trace mastoid fluid. Mild paranasal sinus mucosal thickening. The patient is intubated. Trace fluid in the oral cavity. Postoperative changes to the globes. Negative scalp soft tissues.  Hardware susceptibility artifact in the cervical spine. Chronic spinal Stenosis with mass effect on the spinal cord at C2-C3 (series 3, image 13).  MRA NECK FINDINGS  Precontrast time-of-flight imaging. A antegrade flow in both carotid and vertebral arteries in the neck and to the skullbase.  Post-contrast neck MRA images. Three vessel arch configuration. No great vessel origin stenosis.  Negative right CCA proximal to the right carotid bifurcation. At the right ICA origin there is irregularity and stenosis of up to 60 % with respect to the distal vessel (series 1302, image 84 and series 16109, image 5). The cervical right ICA is patent and otherwise appears negative to the skullbase.  Negative left CCA. Irregularity and stenosis at the left ICA origin also measuring up to 60 % with respect to the distal vessel and best seen on series 1302, image 71. The left ICA remains patent and is otherwise negative to the skullbase.  No proximal subclavian artery stenosis. Normal left vertebral artery origin. Mild if any stenosis at the right vertebral artery origin. The left vertebral artery is mildly dominant throughout.  MRA HEAD FINDINGS  Antegrade flow in the posterior circulation. Dominant distal left vertebral artery. Normal right PICA origin. Dominant appearing left AICA with a patent origin. Patent vertebrobasilar junction. No basilar stenosis. Normal SCA and PCA origins, fetal type on the right. Both posterior communicating arteries are present. Bilateral PCA branches are within normal limits.  Antegrade flow in both ICA siphons. No siphon stenosis. Ophthalmic and posterior communicating artery origins are within normal limits. Small probable atherosclerotic pseudo lesion of the left ICA siphon in the superior hypophyseal region  (series 505, image 11). Patent carotid termini. Normal MCA and ACA origins.  Anterior communicating artery and visualized ACA branches are within normal limits. Left MCA M1 segment and bifurcation are within normal limits. No left MCA branch occlusion identified. Right MCA M1 segment and bifurcation are within normal limits. No major right MCA branch occlusion identified.  IMPRESSION: 1. 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 2. Scattered small left MCA and occasional right SCA acute  infarcts. 3. Bilateral ICA origin stenosis of up to 60% in the neck. No emergent large vessel occlusion. No intracranial stenosis or MCA branch occlusion identified. 4. Chronic spinal stenosis with spinal cord mass effect at C2-C3. New upper cervical fusion hardware.   Electronically Signed   By: Odessa Fleming M.D.   On: 09/08/2015 10:20   Mr Laqueta Jean ZO Contrast  09/08/2015   CLINICAL DATA:  81 year old male with left side weakness following cervical spine and lumbar surgery. Initial encounter.  EXAM: MRI HEAD WITHOUT CONTRAST AND WITH CONTRAST  MRA HEAD WITHOUT CONTRAST  MRA NECK WITHOUT AND WITH CONTRAST  TECHNIQUE: Multiplanar, multiecho pulse sequences of the brain and surrounding structures were obtained without intravenous contrast. Angiographic images of the Circle of Willis were obtained using MRA technique without intravenous contrast. Angiographic images of the neck were obtained using MRA technique without and with intravenous contrast. Carotid stenosis measurements (when applicable) are obtained utilizing NASCET criteria, using the distal internal carotid diameter as the denominator.  CONTRAST:  20mL MULTIHANCE GADOBENATE DIMEGLUMINE 529 MG/ML IV SOLN  COMPARISON:  Head CT without contrast 09/06/2015. Preoperative Cervical spine MRI 08/12/2015  FINDINGS: MRI HEAD FINDINGS  Confluent restricted  diffusion throughout most of the right hemisphere. Near complete involvement of the right MCA territory. Right MCA/PCA watershed involvement extending to the lateral occipital pole. Scattered involvement elsewhere in the right PCA territory. Some of the right temporal lobe also is spared.  Scattered small areas of restricted diffusion also in the left MCA territory, an the right superior cerebellar artery territory.  Major intracranial vascular flow voids are preserved, see MRA findings below.  Confluent cytotoxic edema in the right hemisphere. Areas of petechial hemorrhage in the anterior and posterior right MCA territories. Mass effect on the right lateral ventricle. Mild leftward midline shift of 3 mm. No ventriculomegaly. Basilar cisterns are patent. No extra-axial or intraventricular hemorrhage. Following contrast, no significant post ischemic enhancement.  Minimal other nonspecific cerebral white matter T2 and FLAIR hyperintensity. There is a small chronic lacunar infarct in the right PICA territory (series 6, image 5).  Visible internal auditory structures appear normal. Trace mastoid fluid. Mild paranasal sinus mucosal thickening. The patient is intubated. Trace fluid in the oral cavity. Postoperative changes to the globes. Negative scalp soft tissues.  Hardware susceptibility artifact in the cervical spine. Chronic spinal Stenosis with mass effect on the spinal cord at C2-C3 (series 3, image 13).  MRA NECK FINDINGS  Precontrast time-of-flight imaging. A antegrade flow in both carotid and vertebral arteries in the neck and to the skullbase.  Post-contrast neck MRA images. Three vessel arch configuration. No great vessel origin stenosis.  Negative right CCA proximal to the right carotid bifurcation. At the right ICA origin there is irregularity and stenosis of up to 60 % with respect to the distal vessel (series 1302, image 84 and series 10960, image 5). The cervical right ICA is patent and otherwise appears  negative to the skullbase.  Negative left CCA. Irregularity and stenosis at the left ICA origin also measuring up to 60 % with respect to the distal vessel and best seen on series 1302, image 71. The left ICA remains patent and is otherwise negative to the skullbase.  No proximal subclavian artery stenosis. Normal left vertebral artery origin. Mild if any stenosis at the right vertebral artery origin. The left vertebral artery is mildly dominant throughout.  MRA HEAD FINDINGS  Antegrade flow in the posterior circulation. Dominant distal left vertebral artery. Normal right  PICA origin. Dominant appearing left AICA with a patent origin. Patent vertebrobasilar junction. No basilar stenosis. Normal SCA and PCA origins, fetal type on the right. Both posterior communicating arteries are present. Bilateral PCA branches are within normal limits.  Antegrade flow in both ICA siphons. No siphon stenosis. Ophthalmic and posterior communicating artery origins are within normal limits. Small probable atherosclerotic pseudo lesion of the left ICA siphon in the superior hypophyseal region (series 505, image 11). Patent carotid termini. Normal MCA and ACA origins.  Anterior communicating artery and visualized ACA branches are within normal limits. Left MCA M1 segment and bifurcation are within normal limits. No left MCA branch occlusion identified. Right MCA M1 segment and bifurcation are within normal limits. No major right MCA branch occlusion identified.  IMPRESSION: 1. 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 2. Scattered small left MCA and occasional right SCA acute infarcts. 3. Bilateral ICA origin stenosis of up to 60% in the neck. No emergent large vessel occlusion. No intracranial stenosis or MCA branch occlusion identified. 4. Chronic spinal stenosis with spinal cord mass effect at C2-C3. New upper  cervical fusion hardware.   Electronically Signed   By: Odessa Fleming M.D.   On: 09/08/2015 10:20   Dg Lumbar Spine 1 View  09/06/2015   CLINICAL DATA:  Laminectomy.  EXAM: LUMBAR SPINE - 1 VIEW  COMPARISON:  None.  FINDINGS: Metallic patch that lumbar vertebra are numbered with the lowest lumbar shaped vertebra as L5. This may be a transitional vertebra. 11 mm anterolisthesis of L4 on L5 noted. Metallic markers noted posteriorly at the L4-L5 level  IMPRESSION: Metallic marker noted posteriorly at the L4-L5 level.   Electronically Signed   By: Maisie Fus  Register   On: 09/06/2015 13:28   Dg Chest Port 1 View  09/13/2015   CLINICAL DATA:  81 year old male with respiratory failure. Subsequent encounter.  EXAM: PORTABLE CHEST 1 VIEW  COMPARISON:  09/11/2015.  FINDINGS: Poor inspiration with consolidation lung bases greater on left. This may represent atelectasis or infiltrate and without significant change.  Central pulmonary vascular prominence.  Prominent osteophyte mid thoracic vertebra, better evaluated on 08/12/2015 MR.  Mild cardiomegaly.  No gross pneumothorax.  IMPRESSION: Poor inspiration with consolidation lung bases greater on left. This may represent atelectasis or infiltrate and without significant change.  Central pulmonary vascular prominence.  Prominent osteophyte mid thoracic vertebra, better evaluated on 08/12/2015 MR.  Mild cardiomegaly.   Electronically Signed   By: Lacy Duverney M.D.   On: 09/13/2015 07:59   Dg Chest Port 1 View  09/12/2015   CLINICAL DATA:  Fever today.  EXAM: PORTABLE CHEST 1 VIEW  COMPARISON:  09/09/2015.  FINDINGS: Normal sized heart. Decreased patchy and linear density at the right lung base. Stable patchy density at the left lung base. Thoracic spine degenerative changes.  IMPRESSION: 1. Persistent patchy density at the left lung base, suspicious for pneumonia. 2. Decreased atelectasis at the right lung base with residual patchy pneumonia or atelectasis   Electronically  Signed   By: Beckie Salts M.D.   On: 09/12/2015 03:04   Dg Chest Port 1 View  09/09/2015   CLINICAL DATA:  Acute respiratory failure  EXAM: PORTABLE CHEST - 1 VIEW  COMPARISON:  09/08/2015  FINDINGS: Low lung volumes are present, causing crowding of the pulmonary vasculature. Airspace opacities at the left lung base and left infrahilar region,  slightly worsened. Minimal right basilar airspace opacity medially.  Thoracic spondylosis. Borderline enlargement of the cardiopericardial silhouette.  Endotracheal and nasogastric tubes have been removed.  IMPRESSION: 1. Bibasilar airspace opacities, left greater than right, mildly worsened. 2. Borderline enlargement of the cardiopericardial silhouette.   Electronically Signed   By: Gaylyn Rong M.D.   On: 09/09/2015 08:08   Dg Chest Port 1 View  09/08/2015   CLINICAL DATA:  Acute respiratory failure portable chest x-ray of 09/07/2015  EXAM: PORTABLE CHEST - 1 VIEW  COMPARISON:  None.  FINDINGS: There is persistent basilar atelectasis left-greater-than-right. A small left effusion cannot be excluded. The right lung is grossly clear. Endotracheal tube and NG tube remain, unchanged in position.  IMPRESSION: Persistent left basilar atelectasis.  Stable mild cardiomegaly.   Electronically Signed   By: Dwyane Dee M.D.   On: 09/08/2015 08:39   Portable Chest Xray  09/07/2015   CLINICAL DATA:  Respiratory failure, ventilatory support  EXAM: PORTABLE CHEST - 1 VIEW  COMPARISON:  09/06/2015  FINDINGS: Endotracheal tube 7.8 cm above the carina. NG tube enters the stomach with the tip not visualized. Increased left basilar opacity/atelectasis. Right lung remains clear. No enlarging effusion or pneumothorax. Degenerative changes of the spine. Atherosclerosis of the aorta.  IMPRESSION: Stable support apparatus.  Increased left basilar atelectasis   Electronically Signed   By: Judie Petit.  Shick M.D.   On: 09/07/2015 08:20   Dg Chest Port 1 View  09/06/2015   CLINICAL DATA:   Intubation orogastric tube placement  EXAM: PORTABLE CHEST - 1 VIEW  COMPARISON:  08/01/2010  FINDINGS: Endotracheal tube is 3.9 cm above the carina. Orogastric tube crosses the gastroesophageal junction with the. Mild left base opacity suggesting small effusion. Heart size and vascular pattern normal. Right lung clear. Mild atelectasis left lung base.  IMPRESSION: Lines and tubes as described above.   Electronically Signed   By: Esperanza Heir M.D.   On: 09/06/2015 16:45   Dg Abd Portable 1v  09/13/2015   CLINICAL DATA:  Nasogastric tube placement.  EXAM: PORTABLE ABDOMEN - 1 VIEW  COMPARISON:  09/10/2015 and chest x-ray today.  FINDINGS: There has been placement of a enteric tube with tip over the stomach in the left upper quadrant. Bowel gas pattern is nonobstructive. Persistent left base opacification. Moderate degenerative change of the spine.  IMPRESSION: Nonobstructive bowel gas pattern.  Enteric feeding tube with tip over the stomach in the left upper quadrant.   Electronically Signed   By: Elberta Fortis M.D.   On: 09/13/2015 09:58   Dg Vangie Bicker G Tube Plc W/fl-no Rad  09/15/2015   CLINICAL DATA:    NASO G TUBE PLACEMENT WITH FLUORO  Fluoroscopy was utilized by the requesting physician.  No radiographic  interpretation.    Dg Naso G Tube Plc W/fl-no Rad  09/10/2015   CLINICAL DATA:    NASO G TUBE PLACEMENT WITH FLUORO  Fluoroscopy was utilized by the requesting physician.  No radiographic  interpretation.    Dg Swallowing Func-speech Pathology  09/17/2015    Objective Swallowing Evaluation:    Patient Details  Name: ALEXSIS BRANSCOM MRN: [id number] Date of Birth: 02/09/1930  Today's Date: 09/17/2015 Time: SLP Start Time (ACUTE ONLY): 1030-SLP Stop Time (ACUTE ONLY): 1115 SLP Time Calculation (min) (ACUTE ONLY): 45 min  Past Medical History:  Past Medical History  Diagnosis Date  . Hypertension   . Diabetes mellitus without complication   . DDD (degenerative disc disease)   . Vertigo   .  High  cholesterol   . Peripheral vascular disease   . Pneumonia   . History of kidney stones   . Hx of gallstones   . GERD (gastroesophageal reflux disease)   . Headache   . Wears dentures    Past Surgical History:  Past Surgical History  Procedure Laterality Date  . Cholecystectomy    . Skin graft      Left leg  . Vein surgery    . Diagnostic laparoscopy    . Multiple tooth extractions    . Vascular surgery    . Eye surgery Bilateral   . Anterior cervical decomp/discectomy fusion N/A 09/06/2015    Procedure: Anterior Cervical Decompression Fusion Cervical two,Cervical  three,Cervical four-,Cervical five ;  Surgeon: Julio Sicks, MD;  Location:  [facility];  Service: Neurosurgery;  Laterality: N/A;  . Lumbar laminectomy/decompression microdiscectomy N/A 09/06/2015    Procedure: Lumbar three-four  decompresssive laminectomy  ;  Surgeon:  Julio Sicks, MD;  Location: [facility];  Service: Neurosurgery;   Laterality: N/A;   HPI:  Other Pertinent Information: MAYA SCHOLER is an 81 y.o. male with a  past medical history significant for HTN, DM, hypercholesterolemia, PAD,  GERD, who underwent C2-3 and C4-5 anterior cervical discectomy with  interbody fusion as well as L3-4 decompressive laminectomy with bilateral  L3 and L4 foraminotomies on 9/19, and few hours postoperatively was noted  to have significant left hemiparesis that prompted CT brain which  demonstrated findings concerning for acute ischemia in the right MCA  territory. Pt remained on the vent post-operatively 9/19-9/21.  No Data Recorded  Assessment / Plan / Recommendation CHL IP CLINICAL IMPRESSIONS 09/17/2015  Therapy Diagnosis Severe cervical esophageal phase dysphagia;Severe  pharyngeal phase dysphagia  Clinical Impression Pt demonstrates minimal improvement in function since  prior MBS. Standing secretions have improved and edema has decreased, but  presence of cervical hardware at C4/5 on top of large bony protrusion  impedes epiglottic deflection and  restricts opening of the cervical  esophagus. Thin and nectar thick liquids are significantly aspirated with  insufficient ability to expectorate. Puree textures and honey thick  liquids with large teaspoon boluses are heavy enough to trigger better  opeing, but still significant oropharyngeal residual places pt at high  risk of aspiration. With approval from Dr. Jordan Likes the pts hard collar was  removed to attempt a head turn/chin tuck, but pts movement was still  restricted and no benefit was evidenced. If pt and family choose to accept  risk of aspiration, a puree/and honey thick diet could be initiated and  trialed. If the pt wishes for more aggressive measures, then he should  remain NPO. Would not expect any more dramatic improvement in short term  and long term alternate nutrition would be needed, though risk of  aspiration of secretions will still be high.       CHL IP TREATMENT RECOMMENDATION 09/17/2015  Treatment Recommendations Therapy as outlined in treatment plan below     CHL IP DIET RECOMMENDATION 09/17/2015  SLP Diet Recommendations Dysphagia 1 (Puree);Honey  Liquid Administration via (None)  Medication Administration Crushed with puree  Compensations Multiple dry swallows after each bite/sip  Postural Changes and/or Swallow Maneuvers (None)     CHL IP OTHER RECOMMENDATIONS 09/17/2015  Recommended Consults (None)  Oral Care Recommendations Oral care before and after PO  Other Recommendations (None)     CHL IP FOLLOW UP RECOMMENDATIONS 09/16/2015  Follow up Recommendations Inpatient Rehab     [facility]  IP FREQUENCY AND DURATION 09/17/2015  Speech Therapy Frequency (ACUTE ONLY) min 2x/week  Treatment Duration 2 weeks     Pertinent Vitals/Pain NA    SLP Swallow Goals No flowsheet data found.  No flowsheet data found.    CHL IP REASON FOR REFERRAL 09/17/2015  Reason for Referral Objectively evaluate swallowing function     CHL IP ORAL PHASE 09/17/2015  Lips (None)  Tongue (None)  Mucous membranes (None)  Nutritional  status (None)  Other (None)  Oxygen therapy (None)  Oral Phase WFL  Oral - Pudding Teaspoon (None)  Oral - Pudding Cup (None)  Oral - Honey Teaspoon (None)  Oral - Honey Cup (None)  Oral - Honey Syringe (None)  Oral - Nectar Teaspoon (None)  Oral - Nectar Cup (None)  Oral - Nectar Straw (None)  Oral - Nectar Syringe (None)  Oral - Ice Chips (None)  Oral - Thin Teaspoon (None)  Oral - Thin Cup (None)  Oral - Thin Straw (None)  Oral - Thin Syringe (None)  Oral - Puree (None)  Oral - Mechanical Soft (None)  Oral - Regular (None)  Oral - Multi-consistency (None)  Oral - Pill (None)  Oral Phase - Comment (None)      CHL IP PHARYNGEAL PHASE 09/17/2015  Pharyngeal Phase Impaired  Pharyngeal - Pudding Teaspoon (None)  Penetration/Aspiration details (pudding teaspoon) (None)  Pharyngeal - Pudding Cup (None)  Penetration/Aspiration details (pudding cup) (None)  Pharyngeal - Honey Teaspoon (None)  Penetration/Aspiration details (honey teaspoon) (None)  Pharyngeal - Honey Cup (None)  Penetration/Aspiration details (honey cup) (None)  Pharyngeal - Honey Syringe (None)  Penetration/Aspiration details (honey syringe) (None)  Pharyngeal - Nectar Teaspoon (None)  Penetration/Aspiration details (nectar teaspoon) (None)  Pharyngeal - Nectar Cup (None)  Penetration/Aspiration details (nectar cup) (None)  Pharyngeal - Nectar Straw (None)  Penetration/Aspiration details (nectar straw) (None)  Pharyngeal - Nectar Syringe (None)  Penetration/Aspiration details (nectar syringe) (None)  Pharyngeal - Ice Chips (None)  Penetration/Aspiration details (ice chips) (None)  Pharyngeal - Thin Teaspoon (None)  Penetration/Aspiration details (thin teaspoon) (None)  Pharyngeal - Thin Cup (None)  Penetration/Aspiration details (thin cup) (None)  Pharyngeal - Thin Straw (None)  Penetration/Aspiration details (thin straw) (None)  Pharyngeal - Thin Syringe (None)  Penetration/Aspiration details (thin syringe') (None)  Pharyngeal - Puree (None)   Penetration/Aspiration details (puree) (None)  Pharyngeal - Mechanical Soft (None)  Penetration/Aspiration details (mechanical soft) (None)  Pharyngeal - Regular (None)  Penetration/Aspiration details (regular) (None)  Pharyngeal - Multi-consistency (None)  Penetration/Aspiration details (multi-consistency) (None)  Pharyngeal - Pill (None)  Penetration/Aspiration details (pill) (None)  Pharyngeal Comment (None)      CHL IP CERVICAL ESOPHAGEAL PHASE 09/17/2015  Cervical Esophageal Phase (No Data)  Pudding Teaspoon (None)  Pudding Cup (None)  Honey Teaspoon (None)  Honey Cup (None)  Honey Straw (None)  Nectar Teaspoon (None)  Nectar Cup (None)  Nectar Straw (None)  Nectar Sippy Cup (None)  Thin Teaspoon (None)  Thin Cup (None)  Thin Straw (None)  Thin Sippy Cup (None)  Cervical Esophageal Comment (None)    No flowsheet data found.        Harlon Ditty, MA CCC-SLP [phone]  Claudine Mouton 09/17/2015, 1:14 PM    Dg C-arm 1-60 Min  09/06/2015   CLINICAL DATA:  ACDF C2-3 and C4-5  EXAM: DG C-ARM 61-120 MIN; DG CERVICAL SPINE - 1 VIEW  COMPARISON:  None.  FLUOROSCOPY TIME:  10 seconds  One image  FINDINGS: Single lateral intraoperative fluoroscopic image is  provided of the cervical spine. Anterior cervical fusion with interbody spacer device at C2-3 and C4-5.  IMPRESSION: ACDF C2-3 and C4-5.   Electronically Signed   By: Elige Ko   On: 09/06/2015 12:43    Jeoffrey Massed, MD  [facility] Pager:[phone]  If 7PM-7AM, please contact night-coverage [email or web address] Password [email or web address] 09/23/2015, 12:46 PM   LOS: 17 days

## 2015-09-23 NOTE — Care Management Important Message (Signed)
Important Message  Patient Details  Name: [name] MRN: [id number] Date of Birth: 09-28-1930   Medicare Important Message Given:  Yes-fourth notification given    [name] 09/23/2015, 1:10 PM

## 2015-09-23 NOTE — Progress Notes (Signed)
Nutrition Follow-up   INTERVENTION:  Increase Jevity 1.2 to 70 ml/hr via NGT 30 ml Prostat BID.    Tube feeding regimen provides 2216 kcal (105% of needs), 123 grams of protein, and 1361 ml of H2O.   (After surgery, decrease Pro-stat to once daily; TF regimen will provide 2116 kcal, 108 grams of protein, and 1361 ml of water)  NUTRITION DIAGNOSIS:   Inadequate oral intake related to inability to eat as evidenced by NPO status.  Ongoing  GOAL:   Patient will meet greater than or equal to 90% of their needs  Being Met  MONITOR:   TF tolerance, Labs, I & O's, Weight trends  REASON FOR ASSESSMENT:   Consult Enteral/tube feeding initiation and management  ASSESSMENT:   81 year old male with PMH DM, HTN, GERD who had a posterior approach laminectomy then post op patient was unresponsive with facial swelling and a rash so decision was made to leave patient intubated.  Pt continues to tolerate TF well. Jevity 1.2 infusing via NGT @60  ml/hr at time of visit. Pt denies any abdominal pain or nausea. Occasional diarrhea and loose stools per nursing notes. Imodium ordered PRN. Per chart, possible placement of gastrostomy tube tomorrow.  Labs:  Low sodium, elevated glucose, low chloride, low calcium  Diet Order:  Diet NPO time specified  Skin:  Wound (see comment) (closed neck incision)  Last BM:  10/5  Height:   Ht Readings from Last 1 Encounters:  09/13/15 6' 1"  (1.854 m)    Weight:   Wt Readings from Last 1 Encounters:  09/23/15 173 lb 8 oz (78.7 kg)    Ideal Body Weight:  83.6 kg  BMI:  Body mass index is 22.9 kg/(m^2).  Estimated Nutritional Needs:   Kcal:  1900-2100  Protein:  100-115 grams  Fluid:  > 1.9 L/day  EDUCATION NEEDS:   No education needs identified at this time  Spry, LDN Inpatient Clinical Dietitian Pager: [phone] After Hours Pager: [phone]

## 2015-09-24 ENCOUNTER — Inpatient Hospital Stay (HOSPITAL_COMMUNITY): Payer: Medicare Other

## 2015-09-24 LAB — GLUCOSE, CAPILLARY
GLUCOSE-CAPILLARY: 121 mg/dL — AB (ref 65–99)
Glucose-Capillary: 101 mg/dL — ABNORMAL HIGH (ref 65–99)
Glucose-Capillary: 203 mg/dL — ABNORMAL HIGH (ref 65–99)
Glucose-Capillary: 133 mg/dL — ABNORMAL HIGH (ref 65–99)
Glucose-Capillary: 175 mg/dL — ABNORMAL HIGH (ref 65–99)

## 2015-09-24 LAB — PROTIME-INR
INR: 1.19 (ref 0.00–1.49)
Prothrombin Time: 15.3 seconds — ABNORMAL HIGH (ref 11.6–15.2)

## 2015-09-24 MED ORDER — IOHEXOL 300 MG/ML  SOLN
50.0000 mL | Freq: Once | INTRAMUSCULAR | Status: DC | PRN
  Administered 2015-09-24: 20 mL
  Filled 2015-09-24: qty 50

## 2015-09-24 MED ORDER — METOPROLOL TARTRATE 25 MG/10 ML ORAL SUSPENSION
75.0000 mg | Freq: Two times a day (BID) | ORAL | Status: DC
  Administered 2015-09-24: 75 mg via ORAL
  Filled 2015-09-24 (×3): qty 30

## 2015-09-24 MED ORDER — FENTANYL CITRATE (PF) 100 MCG/2ML IJ SOLN
INTRAMUSCULAR | Status: AC | PRN
Start: 2015-09-24 — End: 2015-09-24
  Administered 2015-09-24 (×2): 25 ug via INTRAVENOUS

## 2015-09-24 MED ORDER — MIDAZOLAM HCL 2 MG/2ML IJ SOLN
INTRAMUSCULAR | Status: AC
  Filled 2015-09-24: qty 4

## 2015-09-24 MED ORDER — VANCOMYCIN HCL IN DEXTROSE 1-5 GM/200ML-% IV SOLN
1000.0000 mg | Freq: Once | INTRAVENOUS | Status: AC
  Administered 2015-09-24: 1000 mg via INTRAVENOUS
  Filled 2015-09-24: qty 200

## 2015-09-24 MED ORDER — LIDOCAINE HCL 1 % IJ SOLN
INTRAMUSCULAR | Status: AC
  Filled 2015-09-24: qty 20

## 2015-09-24 MED ORDER — FENTANYL CITRATE (PF) 100 MCG/2ML IJ SOLN
INTRAMUSCULAR | Status: AC
  Filled 2015-09-24: qty 2

## 2015-09-24 MED ORDER — MIDAZOLAM HCL 2 MG/2ML IJ SOLN
INTRAMUSCULAR | Status: AC | PRN
  Administered 2015-09-24 (×2): 0.5 mg via INTRAVENOUS

## 2015-09-24 NOTE — Progress Notes (Signed)
No issues overnight. Pt has had G-tube today. Has no complaints.  EXAM:  BP 155/74 mmHg  Pulse 111  Temp(Src) 98.4 F (36.9 C) (Oral)  Resp 19  Ht  (1.854 m)  Wt 80.8 kg (178 lb 2.1 oz)  BMI 23.51 kg/m2  SpO2 93%  Awake, alert,  Dysarthric, but fluent Good strength on Right Left hemiplegia  IMPRESSION:  81 y.o. male s/p ACDF/Discectomy and postop stroke. Stable neurologically - PEG placed today - Afib/RVR  PLAN: - TF as tolerated - On metroprolol for HTN/Afib. Appreciate medicine/cards input - SNF placement

## 2015-09-24 NOTE — Progress Notes (Signed)
PT Cancellation Note  Patient Details Name: [name] MRN: [id number] DOB: 10-20-1930   Cancelled Treatment:    Reason Eval/Treat Not Completed: Patient at procedure or test/unavailable.  Pt leaving with transport for TEE.     Ketty Bitton, Alison Murray 09/24/2015, 8:55 AM

## 2015-09-24 NOTE — Progress Notes (Signed)
[facility] consultation follow-up progress note  PATIENT DETAILS Name: [name] Age: 81 y.o. Sex: male Date of Birth: 1930/10/27 Admit Date: 09/06/2015 Admitting Physician Earnie Larsson, MD PJK:DTOIZTI, Betsy Coder, MD  Brief narrative:  [name] is a 81 y.o. male with history of HTN, DM, hypercholesterolemia, PAD, GERD, who presented to [facility] on 9/19 for surgery to remedy progressive bilateral upper and lower extremity numbness from critical cervical and lumbar stenosis. Patient underwent anterior cervical decompression and lumbar decompression with laminectomy on 9/19. Postoperatively, patient remainrf on the ventilator in the intensive care unit. Unfortunately patient was noted to have dense left-sided hemiparesis, and also some concern for angioedema. Further workup demonstrated acute ischemia in the right MCA territory. Hospital course was further complicated by development of atrial fibrillation, presumed aspiration pneumonia and UTI. He unfortunately also developed significant dysphagia, and remains nothing by mouth with a panda tube in place. Patient was subsequently transferred to the medical surgical unit on 9/29, and [facility] was consulted to manage patient's ongoing medical issues.   Significant events during this hospital stay: 9/19 >>to OR for C2-C3 and C4-5 anterior anterior cervical discectomy with interbody fusion, anterior instrumentation at C2-3 and C4-5, L3-4 decompressive laminectomy with bilateral L3 and l4 foraminotomies. Returned to ICU on vent. 9/19>>Noted to have left hemiparesis-CT head -Suggestive of rt MCA infarct. 9/19 >> 9/21 OETT  9/25>> afib with rvr cards consult 10/7>> G tube placement by IR   Subjective: No chest pain/SOB-awake alert-still neglecting left side  Assessment/Plan: Principal Problem: Spinal stenosis in cervical/Lumbar region:s/p  C2-C3 and C4-5 anterior anterior cervical discectomy with  interbody fusion, anterior instrumentation at C2-3 and C4-5, L3-4 decompressive laminectomy with bilateral L3 and l4 foraminotomies.Cervical collar remains in place. Neurosurgery following  Active Problems: Acute Large right MCA CVA with scattered right PCA as well as punctate left MCA and right SCA : Continues to have dense left hemiparesis and hemi-neglect. Echo showed EF around 60-65%, carotid Doppler showed Bilateral ICA origin stenosis of up to 60% in the neck. Given atrial fibrillation, will need to start NOAC's-neurology recommended to wait 2 weeks before initiating anticoagulation as significant risk for hemorrhagic transition. Underwent PEG tube placement on 10/7, if continues to do well-suspect okay to start liquids on 10/8. For now continue with aspirin only.Patient will need outpatient vascular surgery follow-up for carotid artery Doppler monitoring and CEA as indicated. Note LDL 41 (goal<70), A1c 6.1  Atrial fibrillation with WPY:KDXIPJ intermittent episodes of RVR-monitor on Metoprolol 75 mg BID. See above regarding anticoagulation. Cardiology following  Escherichia coli UTI: Afebrile, no leukocytosis. Completed Bactrim-on 10/4  Presumed aspiration pneumonia: Afebrile, no leukocytosis, completed course of Levaquin  Oral thrush:Completed course of diflucan on 10/4   Dysphagia: Multifactorial-suspect predominantly secondary to cervical surgery/hardware position-with contributions critical illness, and possible CVA. Has had numerous SLP evaluation including MBS-recommendations are to continue to remain nothing by mouth.After discussion with patient, family by this M.D., and the palliative care team-decision was made to place a PEG tube, which was subsequently placed on 10/7. Patient/family aware that patient will continue to be at aspiration risk with or without the PEG tube.   Hypokalemia: Resolved  Hyperkalemia:resolved  Dyslipidemia: Continue statin-LDL 41 (goal<70)  Type 2  diabetes: CBGs stable, continue suicide. Resume metformin on discharge. A1c 6.1  Hypertension: Controlled-continue metoprolol. Follow and adjust accordingly  Bilateral carotid artery stenosis: See above. Follow-up with vascular surgery on  discharge  Tobacco abuse disorder: Will need ongoing counseling.  Disposition: Remain inpatient-SNF on 10/8 once peg tube placed  Antimicrobial agents  See below  Anti-infectives    Start     Dose/Rate Route Frequency Ordered Stop   09/24/15 0930  vancomycin (VANCOCIN) IVPB 1000 mg/200 mL premix     1,000 mg 200 mL/hr over 60 Minutes Intravenous  Once 09/24/15 0925 09/24/15 1059   09/15/15 1015  fluconazole (DIFLUCAN) tablet 100 mg  Status:  Discontinued     100 mg Oral Daily 09/15/15 1007 09/15/15 1008   09/15/15 1015  fluconazole (DIFLUCAN) tablet 100 mg     100 mg Per Tube Daily 09/15/15 1008 09/23/15 0959   09/15/15 1000  levofloxacin (LEVAQUIN) IVPB 750 mg     750 mg 100 mL/hr over 90 Minutes Intravenous Every 24 hours 09/14/15 1157 09/18/15 1202   09/14/15 1300  sulfamethoxazole-trimethoprim (BACTRIM,SEPTRA) 200-40 MG/5ML suspension 20 mL     20 mL Per Tube Every 12 hours 09/14/15 1201 09/22/15 0959   09/12/15 1000  levofloxacin (LEVAQUIN) IVPB 500 mg  Status:  Discontinued     500 mg 100 mL/hr over 60 Minutes Intravenous Every 24 hours 09/12/15 0954 09/14/15 1157   09/10/15 1800  vancomycin (VANCOCIN) 1,250 mg in sodium chloride 0.9 % 250 mL IVPB  Status:  Discontinued     1,250 mg 166.7 mL/hr over 90 Minutes Intravenous Every 24 hours 09/09/15 2024 09/11/15 1122   09/06/15 1830  vancomycin (VANCOCIN) IVPB 1000 mg/200 mL premix  Status:  Discontinued     1,000 mg 200 mL/hr over 60 Minutes Intravenous Every 12 hours 09/06/15 1534 09/09/15 2024   09/06/15 0715  bacitracin 50,000 Units in sodium chloride irrigation 0.9 % 500 mL irrigation  Status:  Discontinued       As needed 09/06/15 0900 09/06/15 1300   09/06/15 0644  vancomycin  (VANCOCIN) IVPB 1000 mg/200 mL premix     1,000 mg 200 mL/hr over 60 Minutes Intravenous On call to O.R. 09/06/15 4097 09/06/15 0852      DVT Prophylaxis: Prophylactic Heparin   Code Status: Full code   Family Communication None at bedside  Procedures: 9/19 >>to OR for C2-C3 and C4-5 anterior anterior cervical discectomy with interbody fusion, anterior instrumentation at C2-3 and C4-5, L3-4 decompressive laminectomy with bilateral L3 and l4 foraminotomies. 9/19 >> 9/21 OETT  10/7>>peg tube placement  CONSULTS:  cardiology, pulmonary/intensive care and Palliative  Time spent 30 minutes-Greater than 50% of this time was spent in counseling, explanation of diagnosis, planning of further management, and coordination of care.  MEDICATIONS: Scheduled Meds: . antiseptic oral rinse  7 mL Mouth Rinse q12n4p  . aspirin  81 mg Oral Daily  . chlorhexidine  15 mL Mouth Rinse BID  . feeding supplement (PRO-STAT SUGAR FREE 64)  30 mL Per Tube BID  . fentaNYL      . folic acid  1 mg Oral Daily  . heparin subcutaneous  5,000 Units Subcutaneous 3 times per day  . insulin aspart  0-15 Units Subcutaneous 6 times per day  . lidocaine      . metoprolol tartrate  50 mg Oral BID  . midazolam      . pantoprazole (PROTONIX) IV  40 mg Intravenous QHS  . rosuvastatin  10 mg Oral Daily   Continuous Infusions: . feeding supplement (JEVITY 1.2 CAL) Stopped (09/23/15 2323)   PRN Meds:.acetaminophen **OR** acetaminophen, cyclobenzaprine, iohexol, iohexol, iohexol, loperamide, menthol-cetylpyridinium **OR** phenol, metoprolol, midazolam, midazolam, ondansetron (  ZOFRAN) IV    PHYSICAL EXAM: Vital signs in last 24 hours: Filed Vitals:   09/24/15 0950 09/24/15 0955 09/24/15 1000 09/24/15 1005  BP: 131/67 128/68 137/73 155/74  Pulse: 108 109 110 111  Temp:      TempSrc:      Resp: _0 Height:      Weight:      SpO2: 92% 97% 97% 93%    Weight change: 2.1 kg (4 lb 10.1 oz) Filed  Weights   09/22/15 0426 09/23/15 0500 09/24/15 0417  Weight: 79.6 kg (175 lb 7.8 oz) 78.7 kg (173 lb 8 oz) 80.8 kg (178 lb 2.1 oz)   Body mass index is 23.51 kg/(m^2).   Gen Exam: Awake and alert. Panda tube in place, cervical collar in place Chest: B/L Clear anteriorly CVS: S1 S2 Regular Abdomen: soft, BS +, non tender, non distended.  Extremities: no edema, lower extremities warm to touch. Neurologic: Dense left hemiparesis-with left hemi-neglect. Skin: No Rash.   Wounds: N/A.    Intake/Output from previous day:  Intake/Output Summary (Last 24 hours) at 09/24/15 1148 Last data filed at 09/24/15 0650  Gross per 24 hour  Intake      0 ml  Output   1450 ml  Net  -1450 ml     LAB RESULTS: CBC  Recent Labs Lab 09/22/15 0602  WBC 7.6  HGB 10.9*  HCT 33.8*  PLT 153  MCV 93.4  MCH 30.1  MCHC 32.2  RDW 16.4*    Chemistries   Recent Labs Lab 09/22/15 0602 09/23/15 0614  NA 133* 133*  K 5.3* 4.1  CL 101 99*  CO2 26 28  GLUCOSE 138* 179*  BUN 22* 23*  CREATININE 0.80 0.84  CALCIUM 8.1* 7.9*    CBG:  Recent Labs Lab 09/23/15 1944 09/23/15 2352 09/24/15 0358 09/24/15 0804 09/24/15 1104  GLUCAP 179* 113* 101* 121* 203*    GFR Estimated Creatinine Clearance: 75.3 mL/min (by C-G formula based on Cr of 0.84).  Coagulation profile No results for input(s): INR, PROTIME in the last 168 hours.  Cardiac Enzymes No results for input(s): CKMB, TROPONINI, MYOGLOBIN in the last 168 hours.  Invalid input(s): CK  Invalid input(s): POCBNP No results for input(s): DDIMER in the last 72 hours. No results for input(s): HGBA1C in the last 72 hours. No results for input(s): CHOL, HDL, LDLCALC, TRIG, CHOLHDL, LDLDIRECT in the last 72 hours. No results for input(s): TSH, T4TOTAL, T3FREE, THYROIDAB in the last 72 hours.  Invalid input(s): FREET3 No results for input(s): VITAMINB12, FOLATE, FERRITIN, TIBC, IRON, RETICCTPCT in the last 72 hours. No results for  input(s): LIPASE, AMYLASE in the last 72 hours.  Urine Studies No results for input(s): UHGB, CRYS in the last 72 hours.  Invalid input(s): UACOL, UAPR, USPG, UPH, UTP, UGL, UKET, UBIL, UNIT, UROB, ULEU, UEPI, UWBC, URBC, UBAC, CAST, UCOM, BILUA  MICROBIOLOGY: No results found for this or any previous visit (from the past 240 hour(s)).  RADIOLOGY STUDIES/RESULTS: Dg Cervical Spine 1 View  09/06/2015   CLINICAL DATA:  ACDF C2-3 and C4-5  EXAM: DG C-ARM 61-120 MIN; DG CERVICAL SPINE - 1 VIEW  COMPARISON:  None.  FLUOROSCOPY TIME:  10 seconds  One image  FINDINGS: Single lateral intraoperative fluoroscopic image is provided of the cervical spine. Anterior cervical fusion with interbody spacer device at C2-3 and C4-5.  IMPRESSION: ACDF C2-3 and C4-5.   Electronically Signed   By: Kathreen Devoid  On: 09/06/2015 12:43   Dg Abd 1 View  09/15/2015   CLINICAL DATA:  Feeding tube placement  EXAM: ABDOMEN - 1 VIEW  COMPARISON:  09/13/2015  TECHNIQUE: Twelve French feeding tube placed under fluoroscopy by Baxter Flattery Dingus RT-R.  20 mL of Omnipaque 300 was utilized to confirm placement.  FLUOROSCOPY TIME:  5 minutes 12 seconds  FINDINGS: Contrast opacifies second third portions the duodenum as well as RIGHT proximal jejunal loop.  Feeding tube is placed with the tip at/beyond ligament of Treitz.  IMPRESSION: Placement of feeding tube with tip at/beyond ligament of Treitz.   Electronically Signed   By: Lavonia Dana M.D.   On: 09/15/2015 14:49   Dg Abd 1 View  09/10/2015   CLINICAL DATA:  Nasoenteric feeding tube placement by RT  EXAM: ABDOMEN - 1 VIEW  COMPARISON:  06/15/2006  FINDINGS: Feeding tube extends to the ligament of Treitz, confirmed with contrast injection.  IMPRESSION: Feeding tube placement to ligament of Treitz.   Electronically Signed   By: Lucrezia Europe M.D.   On: 09/10/2015 13:26   Ct Head Wo Contrast  09/06/2015   CLINICAL DATA:  Left-sided weakness following cervical surgery  EXAM: CT HEAD WITHOUT  CONTRAST  TECHNIQUE: Contiguous axial images were obtained from the base of the skull through the vertex without intravenous contrast.  COMPARISON:  06/27/2013  FINDINGS: The bony calvarium is intact. No gross soft tissue abnormality is seen. Mild atrophic changes are noted. There is some generalized decreased attenuation identified in the distribution of the right middle cerebral artery suggestive of a early ischemic event. MRI may be helpful further evaluation. No other focal areas of ischemia or hemorrhage are noted.  IMPRESSION: Chronic atrophic changes.  Generalized decreased attenuation in the distribution of the right middle cerebral artery suggestive of early ischemia. MRI may be helpful for further evaluation.  These results were called by telephone at the time of interpretation on 09/06/2015 at 5:34 pm to [facility], the pts nurse, who verbally acknowledged these results.   Electronically Signed   By: Inez Catalina M.D.   On: 09/06/2015 17:37   Mr Virgel Paling Wo Contrast  09/08/2015   CLINICAL DATA:  81 year old male with left side weakness following cervical spine and lumbar surgery. Initial encounter.  EXAM: MRI HEAD WITHOUT CONTRAST AND WITH CONTRAST  MRA HEAD WITHOUT CONTRAST  MRA NECK WITHOUT AND WITH CONTRAST  TECHNIQUE: Multiplanar, multiecho pulse sequences of the brain and surrounding structures were obtained without intravenous contrast. Angiographic images of the Circle of Willis were obtained using MRA technique without intravenous contrast. Angiographic images of the neck were obtained using MRA technique without and with intravenous contrast. Carotid stenosis measurements (when applicable) are obtained utilizing NASCET criteria, using the distal internal carotid diameter as the denominator.  CONTRAST:  25m MULTIHANCE GADOBENATE DIMEGLUMINE 529 MG/ML IV SOLN  COMPARISON:  Head CT without contrast 09/06/2015. Preoperative Cervical spine MRI 08/12/2015  FINDINGS: MRI HEAD FINDINGS  Confluent  restricted diffusion throughout most of the right hemisphere. Near complete involvement of the right MCA territory. Right MCA/PCA watershed involvement extending to the lateral occipital pole. Scattered involvement elsewhere in the right PCA territory. Some of the right temporal lobe also is spared.  Scattered small areas of restricted diffusion also in the left MCA territory, an the right superior cerebellar artery territory.  Major intracranial vascular flow voids are preserved, see MRA findings below.  Confluent cytotoxic edema in the right hemisphere. Areas of petechial hemorrhage in the anterior and  posterior right MCA territories. Mass effect on the right lateral ventricle. Mild leftward midline shift of 3 mm. No ventriculomegaly. Basilar cisterns are patent. No extra-axial or intraventricular hemorrhage. Following contrast, no significant post ischemic enhancement.  Minimal other nonspecific cerebral white matter T2 and FLAIR hyperintensity. There is a small chronic lacunar infarct in the right PICA territory (series 6, image 5).  Visible internal auditory structures appear normal. Trace mastoid fluid. Mild paranasal sinus mucosal thickening. The patient is intubated. Trace fluid in the oral cavity. Postoperative changes to the globes. Negative scalp soft tissues.  Hardware susceptibility artifact in the cervical spine. Chronic spinal Stenosis with mass effect on the spinal cord at C2-C3 (series 3, image 13).  MRA NECK FINDINGS  Precontrast time-of-flight imaging. A antegrade flow in both carotid and vertebral arteries in the neck and to the skullbase.  Post-contrast neck MRA images. Three vessel arch configuration. No great vessel origin stenosis.  Negative right CCA proximal to the right carotid bifurcation. At the right ICA origin there is irregularity and stenosis of up to 60 % with respect to the distal vessel (series 1302, image 84 and series 11304, image 5). The cervical right ICA is patent and  otherwise appears negative to the skullbase.  Negative left CCA. Irregularity and stenosis at the left ICA origin also measuring up to 60 % with respect to the distal vessel and best seen on series 1302, image 71. The left ICA remains patent and is otherwise negative to the skullbase.  No proximal subclavian artery stenosis. Normal left vertebral artery origin. Mild if any stenosis at the right vertebral artery origin. The left vertebral artery is mildly dominant throughout.  MRA HEAD FINDINGS  Antegrade flow in the posterior circulation. Dominant distal left vertebral artery. Normal right PICA origin. Dominant appearing left AICA with a patent origin. Patent vertebrobasilar junction. No basilar stenosis. Normal SCA and PCA origins, fetal type on the right. Both posterior communicating arteries are present. Bilateral PCA branches are within normal limits.  Antegrade flow in both ICA siphons. No siphon stenosis. Ophthalmic and posterior communicating artery origins are within normal limits. Small probable atherosclerotic pseudo lesion of the left ICA siphon in the superior hypophyseal region (series 505, image 11). Patent carotid termini. Normal MCA and ACA origins.  Anterior communicating artery and visualized ACA branches are within normal limits. Left MCA M1 segment and bifurcation are within normal limits. No left MCA branch occlusion identified. Right MCA M1 segment and bifurcation are within normal limits. No major right MCA branch occlusion identified.  IMPRESSION: 1. 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 2. Scattered small left MCA and occasional right SCA acute infarcts. 3. Bilateral ICA origin stenosis of up to 60% in the neck. No emergent large vessel occlusion. No intracranial stenosis or MCA branch occlusion identified. 4. Chronic spinal stenosis with spinal cord mass effect at  C2-C3. New upper cervical fusion hardware.   Electronically Signed   By: Genevie Ann M.D.   On: 09/08/2015 10:20   Mr Angiogram Neck W Wo Contrast  09/08/2015   CLINICAL DATA:  81 year old male with left side weakness following cervical spine and lumbar surgery. Initial encounter.  EXAM: MRI HEAD WITHOUT CONTRAST AND WITH CONTRAST  MRA HEAD WITHOUT CONTRAST  MRA NECK WITHOUT AND WITH CONTRAST  TECHNIQUE: Multiplanar, multiecho pulse sequences of the brain and surrounding structures were obtained without intravenous contrast.  Angiographic images of the Circle of Willis were obtained using MRA technique without intravenous contrast. Angiographic images of the neck were obtained using MRA technique without and with intravenous contrast. Carotid stenosis measurements (when applicable) are obtained utilizing NASCET criteria, using the distal internal carotid diameter as the denominator.  CONTRAST:  34m MULTIHANCE GADOBENATE DIMEGLUMINE 529 MG/ML IV SOLN  COMPARISON:  Head CT without contrast 09/06/2015. Preoperative Cervical spine MRI 08/12/2015  FINDINGS: MRI HEAD FINDINGS  Confluent restricted diffusion throughout most of the right hemisphere. Near complete involvement of the right MCA territory. Right MCA/PCA watershed involvement extending to the lateral occipital pole. Scattered involvement elsewhere in the right PCA territory. Some of the right temporal lobe also is spared.  Scattered small areas of restricted diffusion also in the left MCA territory, an the right superior cerebellar artery territory.  Major intracranial vascular flow voids are preserved, see MRA findings below.  Confluent cytotoxic edema in the right hemisphere. Areas of petechial hemorrhage in the anterior and posterior right MCA territories. Mass effect on the right lateral ventricle. Mild leftward midline shift of 3 mm. No ventriculomegaly. Basilar cisterns are patent. No extra-axial or intraventricular hemorrhage. Following contrast, no  significant post ischemic enhancement.  Minimal other nonspecific cerebral white matter T2 and FLAIR hyperintensity. There is a small chronic lacunar infarct in the right PICA territory (series 6, image 5).  Visible internal auditory structures appear normal. Trace mastoid fluid. Mild paranasal sinus mucosal thickening. The patient is intubated. Trace fluid in the oral cavity. Postoperative changes to the globes. Negative scalp soft tissues.  Hardware susceptibility artifact in the cervical spine. Chronic spinal Stenosis with mass effect on the spinal cord at C2-C3 (series 3, image 13).  MRA NECK FINDINGS  Precontrast time-of-flight imaging. A antegrade flow in both carotid and vertebral arteries in the neck and to the skullbase.  Post-contrast neck MRA images. Three vessel arch configuration. No great vessel origin stenosis.  Negative right CCA proximal to the right carotid bifurcation. At the right ICA origin there is irregularity and stenosis of up to 60 % with respect to the distal vessel (series 1302, image 84 and series 11304, image 5). The cervical right ICA is patent and otherwise appears negative to the skullbase.  Negative left CCA. Irregularity and stenosis at the left ICA origin also measuring up to 60 % with respect to the distal vessel and best seen on series 1302, image 71. The left ICA remains patent and is otherwise negative to the skullbase.  No proximal subclavian artery stenosis. Normal left vertebral artery origin. Mild if any stenosis at the right vertebral artery origin. The left vertebral artery is mildly dominant throughout.  MRA HEAD FINDINGS  Antegrade flow in the posterior circulation. Dominant distal left vertebral artery. Normal right PICA origin. Dominant appearing left AICA with a patent origin. Patent vertebrobasilar junction. No basilar stenosis. Normal SCA and PCA origins, fetal type on the right. Both posterior communicating arteries are present. Bilateral PCA branches are within  normal limits.  Antegrade flow in both ICA siphons. No siphon stenosis. Ophthalmic and posterior communicating artery origins are within normal limits. Small probable atherosclerotic pseudo lesion of the left ICA siphon in the superior hypophyseal region (series 505, image 11). Patent carotid termini. Normal MCA and ACA origins.  Anterior communicating artery and visualized ACA branches are within normal limits. Left MCA M1 segment and bifurcation are within normal limits. No left MCA branch occlusion identified. Right MCA M1 segment and bifurcation are within normal limits.  No major right MCA branch occlusion identified.  IMPRESSION: 1. 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 2. Scattered small left MCA and occasional right SCA acute infarcts. 3. Bilateral ICA origin stenosis of up to 60% in the neck. No emergent large vessel occlusion. No intracranial stenosis or MCA branch occlusion identified. 4. Chronic spinal stenosis with spinal cord mass effect at C2-C3. New upper cervical fusion hardware.   Electronically Signed   By: Genevie Ann M.D.   On: 09/08/2015 10:20   Mr Jeri Cos ZO Contrast  09/08/2015   CLINICAL DATA:  81 year old male with left side weakness following cervical spine and lumbar surgery. Initial encounter.  EXAM: MRI HEAD WITHOUT CONTRAST AND WITH CONTRAST  MRA HEAD WITHOUT CONTRAST  MRA NECK WITHOUT AND WITH CONTRAST  TECHNIQUE: Multiplanar, multiecho pulse sequences of the brain and surrounding structures were obtained without intravenous contrast. Angiographic images of the Circle of Willis were obtained using MRA technique without intravenous contrast. Angiographic images of the neck were obtained using MRA technique without and with intravenous contrast. Carotid stenosis measurements (when applicable) are obtained utilizing NASCET criteria, using the distal internal  carotid diameter as the denominator.  CONTRAST:  22m MULTIHANCE GADOBENATE DIMEGLUMINE 529 MG/ML IV SOLN  COMPARISON:  Head CT without contrast 09/06/2015. Preoperative Cervical spine MRI 08/12/2015  FINDINGS: MRI HEAD FINDINGS  Confluent restricted diffusion throughout most of the right hemisphere. Near complete involvement of the right MCA territory. Right MCA/PCA watershed involvement extending to the lateral occipital pole. Scattered involvement elsewhere in the right PCA territory. Some of the right temporal lobe also is spared.  Scattered small areas of restricted diffusion also in the left MCA territory, an the right superior cerebellar artery territory.  Major intracranial vascular flow voids are preserved, see MRA findings below.  Confluent cytotoxic edema in the right hemisphere. Areas of petechial hemorrhage in the anterior and posterior right MCA territories. Mass effect on the right lateral ventricle. Mild leftward midline shift of 3 mm. No ventriculomegaly. Basilar cisterns are patent. No extra-axial or intraventricular hemorrhage. Following contrast, no significant post ischemic enhancement.  Minimal other nonspecific cerebral white matter T2 and FLAIR hyperintensity. There is a small chronic lacunar infarct in the right PICA territory (series 6, image 5).  Visible internal auditory structures appear normal. Trace mastoid fluid. Mild paranasal sinus mucosal thickening. The patient is intubated. Trace fluid in the oral cavity. Postoperative changes to the globes. Negative scalp soft tissues.  Hardware susceptibility artifact in the cervical spine. Chronic spinal Stenosis with mass effect on the spinal cord at C2-C3 (series 3, image 13).  MRA NECK FINDINGS  Precontrast time-of-flight imaging. A antegrade flow in both carotid and vertebral arteries in the neck and to the skullbase.  Post-contrast neck MRA images. Three vessel arch configuration. No great vessel origin stenosis.  Negative right CCA  proximal to the right carotid bifurcation. At the right ICA origin there is irregularity and stenosis of up to 60 % with respect to the distal vessel (series 1302, image 84 and series 11304, image 5). The cervical right ICA is patent and otherwise appears negative to the skullbase.  Negative left CCA. Irregularity and stenosis at the left ICA origin also measuring up to 60 % with respect to the distal vessel and best seen on series 1302, image 71. The left ICA remains patent and is otherwise negative to the skullbase.  No proximal subclavian artery stenosis. Normal left vertebral artery origin. Mild if any stenosis at the right vertebral artery origin. The left vertebral artery is mildly dominant throughout.  MRA HEAD FINDINGS  Antegrade flow in the posterior circulation. Dominant distal left vertebral artery. Normal right PICA origin. Dominant appearing left AICA with a patent origin. Patent vertebrobasilar junction. No basilar stenosis. Normal SCA and PCA origins, fetal type on the right. Both posterior communicating arteries are present. Bilateral PCA branches are within normal limits.  Antegrade flow in both ICA siphons. No siphon stenosis. Ophthalmic and posterior communicating artery origins are within normal limits. Small probable atherosclerotic pseudo lesion of the left ICA siphon in the superior hypophyseal region (series 505, image 11). Patent carotid termini. Normal MCA and ACA origins.  Anterior communicating artery and visualized ACA branches are within normal limits. Left MCA M1 segment and bifurcation are within normal limits. No left MCA branch occlusion identified. Right MCA M1 segment and bifurcation are within normal limits. No major right MCA branch occlusion identified.  IMPRESSION: 1. 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 2. Scattered small left MCA and occasional  right SCA acute infarcts. 3. Bilateral ICA origin stenosis of up to 60% in the neck. No emergent large vessel occlusion. No intracranial stenosis or MCA branch occlusion identified. 4. Chronic spinal stenosis with spinal cord mass effect at C2-C3. New upper cervical fusion hardware.   Electronically Signed   By: Genevie Ann M.D.   On: 09/08/2015 10:20   Ir Gastrostomy Tube Mod Sed  09/24/2015   CLINICAL DATA:  Cerebral infarction and need for gastrostomy tube for long-term nutrition.  EXAM: PERCUTANEOUS GASTROSTOMY TUBE PLACEMENT  ANESTHESIA/SEDATION: 1.0 mg IV Versed; 50 mcg IV Fentanyl.  Total Moderate Sedation Time  20 minutes.  CONTRAST:  38m OMNIPAQUE IOHEXOL 300 MG/ML  SOLN  MEDICATIONS: 1 g IV vancomycin. Vancomycin was given within two hours of incision. Vancomycin was given due to an antibiotic allergy.  FLUOROSCOPY TIME:  7 minutes and 6 seconds.  PROCEDURE: The procedure, risks, benefits, and alternatives were explained to the patient's family. Questions regarding the procedure were encouraged and answered. The patient's grandson understands and consented to the procedure.  The evening prior to the procedure, thin liquid barium was administered through an indwelling post pyloric nasal feeding tube in order to opacify the colon. A 5-French catheter was advanced through the the patient's mouth under fluoroscopy into the esophagus and to the level of the stomach. This catheter was used to insufflate the stomach with air under fluoroscopy.  The abdominal wall was prepped with Betadine in a sterile fashion, and a sterile drape was applied covering the operative field. A sterile gown and sterile gloves were used for the procedure. Local anesthesia was provided with 1% Lidocaine.  A skin incision was made in the upper abdominal wall. Under fluoroscopy, an 18 gauge trocar needle was advanced into the stomach. Contrast injection was performed to confirm intraluminal position of the needle tip. A single T tack was  then deployed in the lumen of the stomach. This was brought up to tension at the skin surface.  Over a guidewire, a 9-French sheath was advanced into the lumen of the stomach. The wire was left in place as a safety wire. A loop snare device from a percutaneous gastrostomy kit was then advanced into the stomach.  A floppy guide wire was  advanced through the orogastric catheter under fluoroscopy in the stomach. The loop snare advanced through the percutaneous gastric access was used to snare the guide wire. This allowed withdrawal of the loop snare out of the patient's mouth by retraction of the orogastric catheter and wire.  A 20-French bumper retention gastrostomy tube was looped around the snare device. It was then pulled back through the patient's mouth. The retention bumper was brought up to the anterior gastric wall. The T tack suture was cut at the skin. The exiting gastrostomy tube was cut to appropriate length and a feeding adapter applied. The catheter was injected with contrast material to confirm position and a fluoroscopic spot image saved. The tube was then flushed with saline. A dressing was applied over the gastrostomy exit site.  COMPLICATIONS: None.  FINDINGS: Initial fluoroscopy demonstrates adequate opacification of the colon by ingested barium in order to prevent colonic injury during the procedure. The stomach distended well with air allowing safe placement of the gastrostomy tube. After placement, the tip of the gastrostomy tube lies in the body of the stomach.  IMPRESSION: Percutaneous gastrostomy with placement of a 20-French bumper retention tube in the body of the stomach. This tube can be used for percutaneous feeds beginning in 24 hours after placement.   Electronically Signed   By: Aletta Edouard M.D.   On: 09/24/2015 11:20   Dg Lumbar Spine 1 View  09/06/2015   CLINICAL DATA:  Laminectomy.  EXAM: LUMBAR SPINE - 1 VIEW  COMPARISON:  None.  FINDINGS: Metallic patch that lumbar vertebra  are numbered with the lowest lumbar shaped vertebra as L5. This may be a transitional vertebra. 11 mm anterolisthesis of L4 on L5 noted. Metallic markers noted posteriorly at the L4-L5 level  IMPRESSION: Metallic marker noted posteriorly at the L4-L5 level.   Electronically Signed   By: Marcello Moores  Register   On: 09/06/2015 13:28   Dg Chest Port 1 View  09/13/2015   CLINICAL DATA:  81 year old male with respiratory failure. Subsequent encounter.  EXAM: PORTABLE CHEST 1 VIEW  COMPARISON:  09/11/2015.  FINDINGS: Poor inspiration with consolidation lung bases greater on left. This may represent atelectasis or infiltrate and without significant change.  Central pulmonary vascular prominence.  Prominent osteophyte mid thoracic vertebra, better evaluated on 08/12/2015 MR.  Mild cardiomegaly.  No gross pneumothorax.  IMPRESSION: Poor inspiration with consolidation lung bases greater on left. This may represent atelectasis or infiltrate and without significant change.  Central pulmonary vascular prominence.  Prominent osteophyte mid thoracic vertebra, better evaluated on 08/12/2015 MR.  Mild cardiomegaly.   Electronically Signed   By: Genia Del M.D.   On: 09/13/2015 07:59   Dg Chest Port 1 View  09/12/2015   CLINICAL DATA:  Fever today.  EXAM: PORTABLE CHEST 1 VIEW  COMPARISON:  09/09/2015.  FINDINGS: Normal sized heart. Decreased patchy and linear density at the right lung base. Stable patchy density at the left lung base. Thoracic spine degenerative changes.  IMPRESSION: 1. Persistent patchy density at the left lung base, suspicious for pneumonia. 2. Decreased atelectasis at the right lung base with residual patchy pneumonia or atelectasis   Electronically Signed   By: Claudie Revering M.D.   On: 09/12/2015 03:04   Dg Chest Port 1 View  09/09/2015   CLINICAL DATA:  Acute respiratory failure  EXAM: PORTABLE CHEST - 1 VIEW  COMPARISON:  09/08/2015  FINDINGS: Low lung volumes are present, causing crowding of the  pulmonary vasculature. Airspace opacities at the  left lung base and left infrahilar region, slightly worsened. Minimal right basilar airspace opacity medially.  Thoracic spondylosis. Borderline enlargement of the cardiopericardial silhouette.  Endotracheal and nasogastric tubes have been removed.  IMPRESSION: 1. Bibasilar airspace opacities, left greater than right, mildly worsened. 2. Borderline enlargement of the cardiopericardial silhouette.   Electronically Signed   By: Van Clines M.D.   On: 09/09/2015 08:08   Dg Chest Port 1 View  09/08/2015   CLINICAL DATA:  Acute respiratory failure portable chest x-ray of 09/07/2015  EXAM: PORTABLE CHEST - 1 VIEW  COMPARISON:  None.  FINDINGS: There is persistent basilar atelectasis left-greater-than-right. A small left effusion cannot be excluded. The right lung is grossly clear. Endotracheal tube and NG tube remain, unchanged in position.  IMPRESSION: Persistent left basilar atelectasis.  Stable mild cardiomegaly.   Electronically Signed   By: Ivar Drape M.D.   On: 09/08/2015 08:39   Portable Chest Xray  09/07/2015   CLINICAL DATA:  Respiratory failure, ventilatory support  EXAM: PORTABLE CHEST - 1 VIEW  COMPARISON:  09/06/2015  FINDINGS: Endotracheal tube 7.8 cm above the carina. NG tube enters the stomach with the tip not visualized. Increased left basilar opacity/atelectasis. Right lung remains clear. No enlarging effusion or pneumothorax. Degenerative changes of the spine. Atherosclerosis of the aorta.  IMPRESSION: Stable support apparatus.  Increased left basilar atelectasis   Electronically Signed   By: Jerilynn Mages.  Shick M.D.   On: 09/07/2015 08:20   Dg Chest Port 1 View  09/06/2015   CLINICAL DATA:  Intubation orogastric tube placement  EXAM: PORTABLE CHEST - 1 VIEW  COMPARISON:  08/01/2010  FINDINGS: Endotracheal tube is 3.9 cm above the carina. Orogastric tube crosses the gastroesophageal junction with the. Mild left base opacity suggesting small  effusion. Heart size and vascular pattern normal. Right lung clear. Mild atelectasis left lung base.  IMPRESSION: Lines and tubes as described above.   Electronically Signed   By: Skipper Cliche M.D.   On: 09/06/2015 16:45   Dg Abd Portable 1v  09/24/2015   CLINICAL DATA:  Assess the patient for barium prior to gastrostomy tube placement.  EXAM: PORTABLE ABDOMEN - 1 VIEW  COMPARISON:  None.  FINDINGS: There is dense barium throughout the colon. No significant barium remains in the stomach. The feeding tube tip projects over the proximal jejunum. There are a few mildly distended gas-filled small bowel loops evident. There are degenerative changes of the lumbar spine.  IMPRESSION: No significant contrast remains in the stomach or small bowel. There is dense barium in the colon.   Electronically Signed   By: David  Martinique M.D.   On: 09/24/2015 07:48   Dg Abd Portable 1v  09/13/2015   CLINICAL DATA:  Nasogastric tube placement.  EXAM: PORTABLE ABDOMEN - 1 VIEW  COMPARISON:  09/10/2015 and chest x-ray today.  FINDINGS: There has been placement of a enteric tube with tip over the stomach in the left upper quadrant. Bowel gas pattern is nonobstructive. Persistent left base opacification. Moderate degenerative change of the spine.  IMPRESSION: Nonobstructive bowel gas pattern.  Enteric feeding tube with tip over the stomach in the left upper quadrant.   Electronically Signed   By: Marin Olp M.D.   On: 09/13/2015 09:58   Dg Addison Bailey G Tube Plc W/fl-no Rad  09/15/2015   CLINICAL DATA:    NASO G TUBE PLACEMENT WITH FLUORO  Fluoroscopy was utilized by the requesting physician.  No radiographic  interpretation.    Dg Addison Bailey  G Tube Plc W/fl-no Rad  09/10/2015   CLINICAL DATA:    NASO G TUBE PLACEMENT WITH FLUORO  Fluoroscopy was utilized by the requesting physician.  No radiographic  interpretation.    Dg Swallowing Func-speech Pathology  09/17/2015    Objective Swallowing Evaluation:    Patient Details  Name:  [name] MRN: [id number] Date of Birth: 1930/10/12  Today's Date: 09/17/2015 Time: SLP Start Time (ACUTE ONLY): 1030-SLP Stop Time (ACUTE ONLY): 1115 SLP Time Calculation (min) (ACUTE ONLY): 45 min  Past Medical History:  Past Medical History  Diagnosis Date  . Hypertension   . Diabetes mellitus without complication   . DDD (degenerative disc disease)   . Vertigo   . High cholesterol   . Peripheral vascular disease   . Pneumonia   . History of kidney stones   . Hx of gallstones   . GERD (gastroesophageal reflux disease)   . Headache   . Wears dentures    Past Surgical History:  Past Surgical History  Procedure Laterality Date  . Cholecystectomy    . Skin graft      Left leg  . Vein surgery    . Diagnostic laparoscopy    . Multiple tooth extractions    . Vascular surgery    . Eye surgery Bilateral   . Anterior cervical decomp/discectomy fusion N/A 09/06/2015    Procedure: Anterior Cervical Decompression Fusion Cervical two,Cervical  three,Cervical four-,Cervical five ;  Surgeon: Earnie Larsson, MD;  Location:  [facility];  Service: Neurosurgery;  Laterality: N/A;  . Lumbar laminectomy/decompression microdiscectomy N/A 09/06/2015    Procedure: Lumbar three-four  decompresssive laminectomy  ;  Surgeon:  Earnie Larsson, MD;  Location: [facility];  Service: Neurosurgery;   Laterality: N/A;   HPI:  Other Pertinent Information: [name] is an 81 y.o. male with a  past medical history significant for HTN, DM, hypercholesterolemia, PAD,  GERD, who underwent C2-3 and C4-5 anterior cervical discectomy with  interbody fusion as well as L3-4 decompressive laminectomy with bilateral  L3 and L4 foraminotomies on 9/19, and few hours postoperatively was noted  to have significant left hemiparesis that prompted CT brain which  demonstrated findings concerning for acute ischemia in the right MCA  territory. Pt remained on the vent post-operatively 9/19-9/21.  No Data Recorded  Assessment / Plan / Recommendation CHL IP CLINICAL  IMPRESSIONS 09/17/2015  Therapy Diagnosis Severe cervical esophageal phase dysphagia;Severe  pharyngeal phase dysphagia  Clinical Impression Pt demonstrates minimal improvement in function since  prior MBS. Standing secretions have improved and edema has decreased, but  presence of cervical hardware at C4/5 on top of large bony protrusion  impedes epiglottic deflection and restricts opening of the cervical  esophagus. Thin and nectar thick liquids are significantly aspirated with  insufficient ability to expectorate. Puree textures and honey thick  liquids with large teaspoon boluses are heavy enough to trigger better  opeing, but still significant oropharyngeal residual places pt at high  risk of aspiration. With approval from Dr. Annette Stable the pts hard collar was  removed to attempt a head turn/chin tuck, but pts movement was still  restricted and no benefit was evidenced. If pt and family choose to accept  risk of aspiration, a puree/and honey thick diet could be initiated and  trialed. If the pt wishes for more aggressive measures, then he should  remain NPO. Would not expect any more dramatic improvement in short term  and long term alternate nutrition would be  needed, though risk of  aspiration of secretions will still be high.       CHL IP TREATMENT RECOMMENDATION 09/17/2015  Treatment Recommendations Therapy as outlined in treatment plan below     CHL IP DIET RECOMMENDATION 09/17/2015  SLP Diet Recommendations Dysphagia 1 (Puree);Honey  Liquid Administration via (None)  Medication Administration Crushed with puree  Compensations Multiple dry swallows after each bite/sip  Postural Changes and/or Swallow Maneuvers (None)     CHL IP OTHER RECOMMENDATIONS 09/17/2015  Recommended Consults (None)  Oral Care Recommendations Oral care before and after PO  Other Recommendations (None)     CHL IP FOLLOW UP RECOMMENDATIONS 09/16/2015  Follow up Recommendations Inpatient Rehab     CHL IP FREQUENCY AND DURATION 09/17/2015  Speech  Therapy Frequency (ACUTE ONLY) min 2x/week  Treatment Duration 2 weeks     Pertinent Vitals/Pain NA    SLP Swallow Goals No flowsheet data found.  No flowsheet data found.    CHL IP REASON FOR REFERRAL 09/17/2015  Reason for Referral Objectively evaluate swallowing function     CHL IP ORAL PHASE 09/17/2015  Lips (None)  Tongue (None)  Mucous membranes (None)  Nutritional status (None)  Other (None)  Oxygen therapy (None)  Oral Phase WFL  Oral - Pudding Teaspoon (None)  Oral - Pudding Cup (None)  Oral - Honey Teaspoon (None)  Oral - Honey Cup (None)  Oral - Honey Syringe (None)  Oral - Nectar Teaspoon (None)  Oral - Nectar Cup (None)  Oral - Nectar Straw (None)  Oral - Nectar Syringe (None)  Oral - Ice Chips (None)  Oral - Thin Teaspoon (None)  Oral - Thin Cup (None)  Oral - Thin Straw (None)  Oral - Thin Syringe (None)  Oral - Puree (None)  Oral - Mechanical Soft (None)  Oral - Regular (None)  Oral - Multi-consistency (None)  Oral - Pill (None)  Oral Phase - Comment (None)      CHL IP PHARYNGEAL PHASE 09/17/2015  Pharyngeal Phase Impaired  Pharyngeal - Pudding Teaspoon (None)  Penetration/Aspiration details (pudding teaspoon) (None)  Pharyngeal - Pudding Cup (None)  Penetration/Aspiration details (pudding cup) (None)  Pharyngeal - Honey Teaspoon (None)  Penetration/Aspiration details (honey teaspoon) (None)  Pharyngeal - Honey Cup (None)  Penetration/Aspiration details (honey cup) (None)  Pharyngeal - Honey Syringe (None)  Penetration/Aspiration details (honey syringe) (None)  Pharyngeal - Nectar Teaspoon (None)  Penetration/Aspiration details (nectar teaspoon) (None)  Pharyngeal - Nectar Cup (None)  Penetration/Aspiration details (nectar cup) (None)  Pharyngeal - Nectar Straw (None)  Penetration/Aspiration details (nectar straw) (None)  Pharyngeal - Nectar Syringe (None)  Penetration/Aspiration details (nectar syringe) (None)  Pharyngeal - Ice Chips (None)  Penetration/Aspiration details (ice chips) (None)  Pharyngeal  - Thin Teaspoon (None)  Penetration/Aspiration details (thin teaspoon) (None)  Pharyngeal - Thin Cup (None)  Penetration/Aspiration details (thin cup) (None)  Pharyngeal - Thin Straw (None)  Penetration/Aspiration details (thin straw) (None)  Pharyngeal - Thin Syringe (None)  Penetration/Aspiration details (thin syringe') (None)  Pharyngeal - Puree (None)  Penetration/Aspiration details (puree) (None)  Pharyngeal - Mechanical Soft (None)  Penetration/Aspiration details (mechanical soft) (None)  Pharyngeal - Regular (None)  Penetration/Aspiration details (regular) (None)  Pharyngeal - Multi-consistency (None)  Penetration/Aspiration details (multi-consistency) (None)  Pharyngeal - Pill (None)  Penetration/Aspiration details (pill) (None)  Pharyngeal Comment (None)      CHL IP CERVICAL ESOPHAGEAL PHASE 09/17/2015  Cervical Esophageal Phase (No Data)  Pudding Teaspoon (None)  Pudding Cup (None)  Honey Teaspoon (None)  Honey Cup (None)  Honey Straw (None)  Nectar Teaspoon (None)  Nectar Cup (None)  Nectar Straw (None)  Nectar Sippy Cup (None)  Thin Teaspoon (None)  Thin Cup (None)  Thin Straw (None)  Thin Sippy Cup (None)  Cervical Esophageal Comment (None)    No flowsheet data found.        Herbie Baltimore, MA CCC-SLP [phone]  Lynann Beaver 09/17/2015, 1:14 PM    Dg C-arm 1-60 Min  09/06/2015   CLINICAL DATA:  ACDF C2-3 and C4-5  EXAM: DG C-ARM 61-120 MIN; DG CERVICAL SPINE - 1 VIEW  COMPARISON:  None.  FLUOROSCOPY TIME:  10 seconds  One image  FINDINGS: Single lateral intraoperative fluoroscopic image is provided of the cervical spine. Anterior cervical fusion with interbody spacer device at C2-3 and C4-5.  IMPRESSION: ACDF C2-3 and C4-5.   Electronically Signed   By: Kathreen Devoid   On: 09/06/2015 12:43    Oren Binet, MD  [facility] Pager:[phone]  If 7PM-7AM, please contact night-coverage [email or web address] Password [email or web address] 09/24/2015, 11:48 AM   LOS: 18 days

## 2015-09-24 NOTE — Clinical Social Work Note (Addendum)
Plan for patient to have PEG tube placed today, 10/7. Per MD, if stable patient will likely transition to SNF over the weekend. Patient has a bed at [facility] [place]. Facility aware and prepared for possible discharge over the weekend.   ADDENDUM: CSW notified that PEG tube placement is now scheduled for Monday, 10/10. Facility updated.  FL-2 and DNR form on chart for MD signature.   CSW will continue to follow patient and pt's family for continued support and to facilitate pt's discharge needs once medically stable.   Derenda Fennel, MSW, LCSWA [phone] 09/24/2015 9:05 AM

## 2015-09-24 NOTE — Sedation Documentation (Signed)
Patient is resting comfortably. 

## 2015-09-24 NOTE — Procedures (Signed)
Interventional Radiology Procedure Note  Procedure:  Percutaneous Gastrostomy  Complications:  None  Estimated Blood Loss:  < 10 mL  Recommendations:  20 Fr g-tube placed in body of stomach.  OK to use in 24 hours.  Jodi Marble. Fredia Sorrow, M.D Pager:  [phone]

## 2015-09-24 NOTE — Sedation Documentation (Signed)
Dressing to GT site place. Site intact.

## 2015-09-25 DIAGNOSIS — I110 Hypertensive heart disease with heart failure: Secondary | ICD-10-CM

## 2015-09-25 DIAGNOSIS — R1310 Dysphagia, unspecified: Secondary | ICD-10-CM | POA: Insufficient documentation

## 2015-09-25 LAB — GLUCOSE, CAPILLARY
GLUCOSE-CAPILLARY: 166 mg/dL — AB (ref 65–99)
GLUCOSE-CAPILLARY: 169 mg/dL — AB (ref 65–99)
Glucose-Capillary: 120 mg/dL — ABNORMAL HIGH (ref 65–99)
Glucose-Capillary: 178 mg/dL — ABNORMAL HIGH (ref 65–99)
Glucose-Capillary: 182 mg/dL — ABNORMAL HIGH (ref 65–99)
Glucose-Capillary: 97 mg/dL (ref 65–99)

## 2015-09-25 MED ORDER — METOPROLOL TARTRATE 25 MG/10 ML ORAL SUSPENSION
100.0000 mg | Freq: Two times a day (BID) | ORAL | Status: DC
  Administered 2015-09-25 – 2015-09-27 (×5): 100 mg via ORAL
  Filled 2015-09-25 (×6): qty 40

## 2015-09-25 MED ORDER — APIXABAN 5 MG PO TABS
5.0000 mg | ORAL_TABLET | Freq: Two times a day (BID) | ORAL | Status: DC
  Administered 2015-09-25 – 2015-09-27 (×5): 5 mg
  Filled 2015-09-25 (×5): qty 1

## 2015-09-25 NOTE — Progress Notes (Signed)
Patient ID: [name], male   DOB: 1930-07-02, 81 y.o.   MRN: [id number] BP 116/49 mmHg  Pulse 110  Temp(Src) 98.6 F (37 C) (Oral)  Resp 16  Ht  (1.854 m)  Wt 80.9 kg (178 lb 5.6 oz)  BMI 23.54 kg/m2  SpO2 97% Lethargic Wound is healing well Peg tube placed, seems to have tolerated the procedure well Continue current care.

## 2015-09-25 NOTE — Progress Notes (Signed)
ANTICOAGULATION CONSULT NOTE - Initial Consult  Pharmacy Consult for apixaban Indication: atrial fibrillation  Allergies  Allergen Reactions  . Penicillins Other (See Comments)    unknown  . Adhesive [Tape] Rash    Patient Measurements: Height:  (185.4 cm) Weight: 178 lb 5.6 oz (80.9 kg) IBW/kg (Calculated) : 79.9  Vital Signs: Temp: 99.1 F (37.3 C) (10/08 0510) Temp Source: Oral (10/08 0510) BP: 115/44 mmHg (10/08 0510) Pulse Rate: 92 (10/08 0510)  Labs:  Recent Labs  09/23/15 0614 09/24/15 1210  LABPROT  --  15.3*  INR  --  1.19  CREATININE 0.84  --     Estimated Creatinine Clearance: 75.3 mL/min (by C-G formula based on Cr of 0.84).   Medical History: Past Medical History  Diagnosis Date  . Hypertension   . Diabetes mellitus without complication (HCC)   . DDD (degenerative disc disease)   . Vertigo   . High cholesterol   . Peripheral vascular disease (HCC)   . Pneumonia   . History of kidney stones   . Hx of gallstones   . GERD (gastroesophageal reflux disease)   . Headache   . Wears dentures    Assessment: 85 yom to start apixaban for anticoagulation. Last H/H slightly low and platelets were. Pts weight is 29kg, age is 73 years and Scr 0.84.   Goal of Therapy:  Therapeutic anticoagulation Monitor platelets by anticoagulation protocol: Yes   Plan:  - DC heparin SQ - Apixaban  PO BID - F/u renal function, S&S of bleeding - Check BMET + CBC in AM  [name], Drake Leach 09/25/2015,7:14 AM

## 2015-09-25 NOTE — Progress Notes (Signed)
Patient ID: [name], male   DOB: 25-Jan-1930, 81 y.o.   MRN: [id number]    Referring Physician(s): TRH  Chief Complaint: CVA, dysphagia   Subjective:  Pt without new changes per nursing; sitting up in chair in restraints  Allergies: Penicillins and Adhesive  Medications: Prior to Admission medications   Medication Sig Start Date End Date Taking? Authorizing Provider  aspirin EC 81 MG tablet Take 81 mg by mouth daily.   Yes Historical Provider, MD  folic acid (FOLVITE) 1 MG tablet Take 1 mg by mouth daily.  06/01/15  Yes Historical Provider, MD  hydrochlorothiazide (HYDRODIURIL) 25 MG tablet Take 25 mg by mouth daily as needed (for leg swelling).  07/14/15  Yes Historical Provider, MD  metFORMIN (GLUCOPHAGE) 500 MG tablet Take 500 mg by mouth 2 (two) times daily.   Yes Historical Provider, MD  methotrexate 2.5 MG tablet Take 20 mg by mouth every Saturday. Takes every saturday   Yes Historical Provider, MD  propranolol (INNOPRAN XL) 80 MG 24 hr capsule Take 80 mg by mouth at bedtime.   Yes Historical Provider, MD  rosuvastatin (CRESTOR) 10 MG tablet Take 10 mg by mouth daily.   Yes Historical Provider, MD  telmisartan (MICARDIS) 80 MG tablet Take 80 mg by mouth daily.   Yes Historical Provider, MD  oxyCODONE-acetaminophen (PERCOCET/ROXICET) 5-325 MG per tablet Take 1 tablet by mouth every 4 (four) hours as needed for pain.    Historical Provider, MD     Vital Signs: BP 116/49 mmHg  Pulse 110  Temp(Src) 98.6 F (37 C) (Oral)  Resp 16  Ht 6' 1"  (1.854 m)  Wt 178 lb 5.6 oz (80.9 kg)  BMI 23.54 kg/m2  SpO2 97%  Physical Exam G tube intact, dressing dry, abd soft,+BS, ND  Imaging: Ir Gastrostomy Tube Mod Sed  09/24/2015   CLINICAL DATA:  Cerebral infarction and need for gastrostomy tube for long-term nutrition.  EXAM: PERCUTANEOUS GASTROSTOMY TUBE PLACEMENT  ANESTHESIA/SEDATION: 1.0 mg IV Versed; 50 mcg IV Fentanyl.  Total Moderate Sedation Time  20 minutes.  CONTRAST:   47m OMNIPAQUE IOHEXOL 300 MG/ML  SOLN  MEDICATIONS: 1 g IV vancomycin. Vancomycin was given within two hours of incision. Vancomycin was given due to an antibiotic allergy.  FLUOROSCOPY TIME:  7 minutes and 6 seconds.  PROCEDURE: The procedure, risks, benefits, and alternatives were explained to the patient's family. Questions regarding the procedure were encouraged and answered. The patient's grandson understands and consented to the procedure.  The evening prior to the procedure, thin liquid barium was administered through an indwelling post pyloric nasal feeding tube in order to opacify the colon. A 5-French catheter was advanced through the the patient's mouth under fluoroscopy into the esophagus and to the level of the stomach. This catheter was used to insufflate the stomach with air under fluoroscopy.  The abdominal wall was prepped with Betadine in a sterile fashion, and a sterile drape was applied covering the operative field. A sterile gown and sterile gloves were used for the procedure. Local anesthesia was provided with 1% Lidocaine.  A skin incision was made in the upper abdominal wall. Under fluoroscopy, an 18 gauge trocar needle was advanced into the stomach. Contrast injection was performed to confirm intraluminal position of the needle tip. A single T tack was then deployed in the lumen of the stomach. This was brought up to tension at the skin surface.  Over a guidewire, a 9-French sheath was advanced into the lumen of the  stomach. The wire was left in place as a safety wire. A loop snare device from a percutaneous gastrostomy kit was then advanced into the stomach.  A floppy guide wire was advanced through the orogastric catheter under fluoroscopy in the stomach. The loop snare advanced through the percutaneous gastric access was used to snare the guide wire. This allowed withdrawal of the loop snare out of the patient's mouth by retraction of the orogastric catheter and wire.  A 20-French bumper  retention gastrostomy tube was looped around the snare device. It was then pulled back through the patient's mouth. The retention bumper was brought up to the anterior gastric wall. The T tack suture was cut at the skin. The exiting gastrostomy tube was cut to appropriate length and a feeding adapter applied. The catheter was injected with contrast material to confirm position and a fluoroscopic spot image saved. The tube was then flushed with saline. A dressing was applied over the gastrostomy exit site.  COMPLICATIONS: None.  FINDINGS: Initial fluoroscopy demonstrates adequate opacification of the colon by ingested barium in order to prevent colonic injury during the procedure. The stomach distended well with air allowing safe placement of the gastrostomy tube. After placement, the tip of the gastrostomy tube lies in the body of the stomach.  IMPRESSION: Percutaneous gastrostomy with placement of a 20-French bumper retention tube in the body of the stomach. This tube can be used for percutaneous feeds beginning in 24 hours after placement.   Electronically Signed   By: Aletta Edouard M.D.   On: 09/24/2015 11:20   Dg Abd Portable 1v  09/24/2015   CLINICAL DATA:  Assess the patient for barium prior to gastrostomy tube placement.  EXAM: PORTABLE ABDOMEN - 1 VIEW  COMPARISON:  None.  FINDINGS: There is dense barium throughout the colon. No significant barium remains in the stomach. The feeding tube tip projects over the proximal jejunum. There are a few mildly distended gas-filled small bowel loops evident. There are degenerative changes of the lumbar spine.  IMPRESSION: No significant contrast remains in the stomach or small bowel. There is dense barium in the colon.   Electronically Signed   By: David  Martinique M.D.   On: 09/24/2015 07:48    Labs:  CBC:  Recent Labs  09/11/15 2315 09/13/15 0230 09/14/15 0532 09/22/15 0602  WBC 10.0 8.1 7.3 7.6  HGB 11.9* 11.0* 10.6* 10.9*  HCT 34.3* 32.7* 31.8*  33.8*  PLT 95* 106* 130* 153    COAGS:  Recent Labs  09/24/15 1210  INR 1.19    BMP:  Recent Labs  09/15/15 0726 09/16/15 0405 09/22/15 0602 09/23/15 0614  NA 135 136 133* 133*  K 3.4* 4.2 5.3* 4.1  CL 102 104 101 99*  CO2 27 25 26 28   GLUCOSE 129* 206* 138* 179*  BUN 14 16 22* 23*  CALCIUM 7.9* 8.0* 8.1* 7.9*  CREATININE 0.74 0.70 0.80 0.84  GFRNONAA >60 >60 >60 >60  GFRAA >60 >60 >60 >60    LIVER FUNCTION TESTS:  Recent Labs  09/06/15 1635 09/11/15 1959 09/15/15 0726  BILITOT 1.3* 1.7* 1.7*  AST 22 24 33  ALT 12* 18 23  ALKPHOS 73 68 73  PROT 5.4* 5.3* 5.4*  ALBUMIN 2.8* 2.3* 1.9*    Assessment and Plan:  CVA,dysphagia; s/p perc gastrostomy tube 10/7; site stable- ok to use tube for feeds; other plans as per primary team.   Signed: D. Rowe Robert 09/25/2015, 10:18 AM   I spent a  total of 15 minutes at the the patient's bedside AND on the patient's hospital floor or unit, greater than 50% of which was counseling/coordinating care for perc gastrostomy tube

## 2015-09-25 NOTE — Progress Notes (Signed)
[facility] consultation follow-up progress note  PATIENT DETAILS Name: [name] Age: 81 y.o. Sex: male Date of Birth: 19-Apr-1930 Admit Date: 09/06/2015 Admitting Physician Earnie Larsson, MD [id number], Betsy Coder, MD  Brief narrative:  [name] is a 81 y.o. male with history of HTN, DM, hypercholesterolemia, PAD, GERD, who presented to [facility] on 9/19 for surgery to remedy progressive bilateral upper and lower extremity numbness from critical cervical and lumbar stenosis. Patient underwent anterior cervical decompression and lumbar decompression with laminectomy on 9/19. Postoperatively, patient remainrf on the ventilator in the intensive care unit. Unfortunately patient was noted to have dense left-sided hemiparesis, and also some concern for angioedema. Further workup demonstrated acute ischemia in the right MCA territory. Hospital course was further complicated by development of atrial fibrillation, presumed aspiration pneumonia and UTI. He unfortunately also developed significant dysphagia, and remains nothing by mouth with a panda tube in place. Patient was subsequently transferred to the medical surgical unit on 9/29, and [facility] was consulted to manage patient's ongoing medical issues.   Significant events during this hospital stay: 9/19 >>to OR for C2-C3 and C4-5 anterior anterior cervical discectomy with interbody fusion, anterior instrumentation at C2-3 and C4-5, L3-4 decompressive laminectomy with bilateral L3 and l4 foraminotomies. Returned to ICU on vent. 9/19>>Noted to have left hemiparesis-CT head -Suggestive of rt MCA infarct. 9/19 >> 9/21 OETT  9/25>> afib with rvr cards consult 10/7>> G tube placement by IR  10/8>>started Eliquis  Subjective: No chest pain/SOB-awake alert-still neglecting left side  Assessment/Plan: Principal Problem: Spinal stenosis in cervical/Lumbar region:s/p  C2-C3 and C4-5 anterior anterior  cervical discectomy with interbody fusion, anterior instrumentation at C2-3 and C4-5, L3-4 decompressive laminectomy with bilateral L3 and l4 foraminotomies.Cervical collar remains in place. Neurosurgery following  Active Problems: Acute Large right MCA CVA with scattered right PCA as well as punctate left MCA and right SCA : Continues to have dense left hemiparesis and hemi-neglect. Echo showed EF around 60-65%, carotid Doppler showed Bilateral ICA origin stenosis of up to 60% in the neck. Given atrial fibrillation, will need to start NOAC's-neurology recommended to wait 2 weeks before initiating anticoagulation as significant risk for hemorrhagic transition. Underwent PEG tube placement on 10/7,will start Eliquis on 10/8 and discontinue ASA.Pattient will need outpatient vascular surgery follow-up for carotid artery Doppler monitoring and CEA as indicated. Note LDL 41 (goal<70), A1c 6.1  Atrial fibrillation with OFH:QRFXJO intermittent episodes of RVR-Continue Metoprolol-but increase to 100 mg BID. See above regarding anticoagulation. Cardiology following  Escherichia coli UTI: Afebrile, no leukocytosis. Completed Bactrim-on 10/4  Presumed aspiration pneumonia: Afebrile, no leukocytosis, completed course of Levaquin  Oral thrush:Completed course of diflucan on 10/4   Dysphagia: Multifactorial-suspect predominantly secondary to cervical surgery/hardware position-with contributions critical illness, and possible CVA. Has had numerous SLP evaluation including MBS-recommendations are to continue to remain nothing by mouth.After discussion with patient, family by this M.D., and the palliative care team-decision was made to place a PEG tube, which was subsequently placed on 10/7. Patient/family aware that patient will continue to be at aspiration risk with or without the PEG tube.   Hypokalemia: Resolved  Hyperkalemia:resolved  Dyslipidemia: Continue statin-LDL 41 (goal<70)  Type 2 diabetes: CBGs  stable, continue suicide. Resume metformin on discharge. A1c 6.1  Hypertension: Controlled-continue metoprolol. Follow and adjust accordingly  Bilateral carotid artery stenosis: See above. Follow-up with vascular surgery on discharge  Tobacco abuse disorder: Will need  ongoing counseling.  Disposition: Remain inpatient-SNF likely early next week  Antimicrobial agents  See below  Anti-infectives    Start     Dose/Rate Route Frequency Ordered Stop   09/24/15 0930  vancomycin (VANCOCIN) IVPB 1000 mg/200 mL premix     1,000 mg 200 mL/hr over 60 Minutes Intravenous  Once 09/24/15 0925 09/24/15 1059   09/15/15 1015  fluconazole (DIFLUCAN) tablet 100 mg  Status:  Discontinued     100 mg Oral Daily 09/15/15 1007 09/15/15 1008   09/15/15 1015  fluconazole (DIFLUCAN) tablet 100 mg     100 mg Per Tube Daily 09/15/15 1008 09/23/15 0959   09/15/15 1000  levofloxacin (LEVAQUIN) IVPB 750 mg     750 mg 100 mL/hr over 90 Minutes Intravenous Every 24 hours 09/14/15 1157 09/18/15 1202   09/14/15 1300  sulfamethoxazole-trimethoprim (BACTRIM,SEPTRA) 200-40 MG/5ML suspension 20 mL     20 mL Per Tube Every 12 hours 09/14/15 1201 09/22/15 0959   09/12/15 1000  levofloxacin (LEVAQUIN) IVPB 500 mg  Status:  Discontinued     500 mg 100 mL/hr over 60 Minutes Intravenous Every 24 hours 09/12/15 0954 09/14/15 1157   09/10/15 1800  vancomycin (VANCOCIN) 1,250 mg in sodium chloride 0.9 % 250 mL IVPB  Status:  Discontinued     1,250 mg 166.7 mL/hr over 90 Minutes Intravenous Every 24 hours 09/09/15 2024 09/11/15 1122   09/06/15 1830  vancomycin (VANCOCIN) IVPB 1000 mg/200 mL premix  Status:  Discontinued     1,000 mg 200 mL/hr over 60 Minutes Intravenous Every 12 hours 09/06/15 1534 09/09/15 2024   09/06/15 0715  bacitracin 50,000 Units in sodium chloride irrigation 0.9 % 500 mL irrigation  Status:  Discontinued       As needed 09/06/15 0900 09/06/15 1300   09/06/15 0644  vancomycin (VANCOCIN) IVPB 1000 mg/200  mL premix     1,000 mg 200 mL/hr over 60 Minutes Intravenous On call to O.R. 09/06/15 6734 09/06/15 0852      DVT Prophylaxis: Eliquis  Code Status: Full code   Family Communication None at bedside  Procedures: 9/19 >>to OR for C2-C3 and C4-5 anterior anterior cervical discectomy with interbody fusion, anterior instrumentation at C2-3 and C4-5, L3-4 decompressive laminectomy with bilateral L3 and l4 foraminotomies. 9/19 >> 9/21 OETT  10/7>>peg tube placement  CONSULTS:  cardiology, pulmonary/intensive care and Palliative  Time spent 20 minutes-Greater than 50% of this time was spent in counseling, explanation of diagnosis, planning of further management, and coordination of care.  MEDICATIONS: Scheduled Meds: . antiseptic oral rinse  7 mL Mouth Rinse q12n4p  . apixaban  5 mg Per Tube BID  . chlorhexidine  15 mL Mouth Rinse BID  . feeding supplement (PRO-STAT SUGAR FREE 64)  30 mL Per Tube BID  . folic acid  1 mg Oral Daily  . insulin aspart  0-15 Units Subcutaneous 6 times per day  . metoprolol tartrate  100 mg Oral BID  . pantoprazole (PROTONIX) IV  40 mg Intravenous QHS  . rosuvastatin  10 mg Oral Daily   Continuous Infusions: . feeding supplement (JEVITY 1.2 CAL) 1,000 mL (09/25/15 0832)   PRN Meds:.acetaminophen **OR** acetaminophen, cyclobenzaprine, iohexol, iohexol, iohexol, loperamide, menthol-cetylpyridinium **OR** phenol, metoprolol, midazolam, midazolam, ondansetron (ZOFRAN) IV    PHYSICAL EXAM: Vital signs in last 24 hours: Filed Vitals:   09/25/15 0150 09/25/15 0500 09/25/15 0510 09/25/15 0911  BP: 128/56  115/44 116/49  Pulse: 92  92 110  Temp: 99.1 F (37.3  C)  99.1 F (37.3 C) 98.6 F (37 C)  TempSrc: Oral  Oral Oral  Resp: _0 Height:      Weight:  80.9 kg (178 lb 5.6 oz)    SpO2: 97%  97% 97%    Weight change: 0.1 kg (3.5 oz) Filed Weights   09/23/15 0500 09/24/15 0417 09/25/15 0500  Weight: 78.7 kg (173 lb 8 oz) 80.8 kg (178 lb  2.1 oz) 80.9 kg (178 lb 5.6 oz)   Body mass index is 23.54 kg/(m^2).   Gen Exam: Awake and alert, cervical collar in place Chest: B/L Clear anteriorly CVS: S1 S2 Regular Abdomen: soft, BS +, non tender, non distended.  Extremities: no edema, lower extremities warm to touch. Neurologic: Dense left hemiparesis-with left hemi-neglect. Skin: No Rash.   Wounds: N/A.    Intake/Output from previous day: No intake or output data in the 24 hours ending 09/25/15 0950   LAB RESULTS: CBC  Recent Labs Lab 09/22/15 0602  WBC 7.6  HGB 10.9*  HCT 33.8*  PLT 153  MCV 93.4  MCH 30.1  MCHC 32.2  RDW 16.4*    Chemistries   Recent Labs Lab 09/22/15 0602 09/23/15 0614  NA 133* 133*  K 5.3* 4.1  CL 101 99*  CO2 26 28  GLUCOSE 138* 179*  BUN 22* 23*  CREATININE 0.80 0.84  CALCIUM 8.1* 7.9*    CBG:  Recent Labs Lab 09/24/15 1626 09/24/15 1954 09/25/15 0004 09/25/15 0404 09/25/15 0731  GLUCAP 133* 175* 182* 97 120*    GFR Estimated Creatinine Clearance: 75.3 mL/min (by C-G formula based on Cr of 0.84).  Coagulation profile  Recent Labs Lab 09/24/15 1210  INR 1.19    Cardiac Enzymes No results for input(s): CKMB, TROPONINI, MYOGLOBIN in the last 168 hours.  Invalid input(s): CK  Invalid input(s): POCBNP No results for input(s): DDIMER in the last 72 hours. No results for input(s): HGBA1C in the last 72 hours. No results for input(s): CHOL, HDL, LDLCALC, TRIG, CHOLHDL, LDLDIRECT in the last 72 hours. No results for input(s): TSH, T4TOTAL, T3FREE, THYROIDAB in the last 72 hours.  Invalid input(s): FREET3 No results for input(s): VITAMINB12, FOLATE, FERRITIN, TIBC, IRON, RETICCTPCT in the last 72 hours. No results for input(s): LIPASE, AMYLASE in the last 72 hours.  Urine Studies No results for input(s): UHGB, CRYS in the last 72 hours.  Invalid input(s): UACOL, UAPR, USPG, UPH, UTP, UGL, UKET, UBIL, UNIT, UROB, ULEU, UEPI, UWBC, URBC, UBAC, CAST, UCOM,  BILUA  MICROBIOLOGY: No results found for this or any previous visit (from the past 240 hour(s)).  RADIOLOGY STUDIES/RESULTS: Dg Cervical Spine 1 View  09/06/2015   CLINICAL DATA:  ACDF C2-3 and C4-5  EXAM: DG C-ARM 61-120 MIN; DG CERVICAL SPINE - 1 VIEW  COMPARISON:  None.  FLUOROSCOPY TIME:  10 seconds  One image  FINDINGS: Single lateral intraoperative fluoroscopic image is provided of the cervical spine. Anterior cervical fusion with interbody spacer device at C2-3 and C4-5.  IMPRESSION: ACDF C2-3 and C4-5.   Electronically Signed   By: Kathreen Devoid   On: 09/06/2015 12:43   Dg Abd 1 View  09/15/2015   CLINICAL DATA:  Feeding tube placement  EXAM: ABDOMEN - 1 VIEW  COMPARISON:  09/13/2015  TECHNIQUE: Twelve French feeding tube placed under fluoroscopy by Baxter Flattery Dingus RT-R.  20 mL of Omnipaque 300 was utilized to confirm placement.  FLUOROSCOPY TIME:  5 minutes 12 seconds  FINDINGS: Contrast  opacifies second third portions the duodenum as well as RIGHT proximal jejunal loop.  Feeding tube is placed with the tip at/beyond ligament of Treitz.  IMPRESSION: Placement of feeding tube with tip at/beyond ligament of Treitz.   Electronically Signed   By: Lavonia Dana M.D.   On: 09/15/2015 14:49   Dg Abd 1 View  09/10/2015   CLINICAL DATA:  Nasoenteric feeding tube placement by RT  EXAM: ABDOMEN - 1 VIEW  COMPARISON:  06/15/2006  FINDINGS: Feeding tube extends to the ligament of Treitz, confirmed with contrast injection.  IMPRESSION: Feeding tube placement to ligament of Treitz.   Electronically Signed   By: Lucrezia Europe M.D.   On: 09/10/2015 13:26   Ct Head Wo Contrast  09/06/2015   CLINICAL DATA:  Left-sided weakness following cervical surgery  EXAM: CT HEAD WITHOUT CONTRAST  TECHNIQUE: Contiguous axial images were obtained from the base of the skull through the vertex without intravenous contrast.  COMPARISON:  06/27/2013  FINDINGS: The bony calvarium is intact. No gross soft tissue abnormality is seen. Mild  atrophic changes are noted. There is some generalized decreased attenuation identified in the distribution of the right middle cerebral artery suggestive of a early ischemic event. MRI may be helpful further evaluation. No other focal areas of ischemia or hemorrhage are noted.  IMPRESSION: Chronic atrophic changes.  Generalized decreased attenuation in the distribution of the right middle cerebral artery suggestive of early ischemia. MRI may be helpful for further evaluation.  These results were called by telephone at the time of interpretation on 09/06/2015 at 5:34 pm to [facility], the pts nurse, who verbally acknowledged these results.   Electronically Signed   By: Inez Catalina M.D.   On: 09/06/2015 17:37   Mr Virgel Paling Wo Contrast  09/08/2015   CLINICAL DATA:  81 year old male with left side weakness following cervical spine and lumbar surgery. Initial encounter.  EXAM: MRI HEAD WITHOUT CONTRAST AND WITH CONTRAST  MRA HEAD WITHOUT CONTRAST  MRA NECK WITHOUT AND WITH CONTRAST  TECHNIQUE: Multiplanar, multiecho pulse sequences of the brain and surrounding structures were obtained without intravenous contrast. Angiographic images of the Circle of Willis were obtained using MRA technique without intravenous contrast. Angiographic images of the neck were obtained using MRA technique without and with intravenous contrast. Carotid stenosis measurements (when applicable) are obtained utilizing NASCET criteria, using the distal internal carotid diameter as the denominator.  CONTRAST:  81m MULTIHANCE GADOBENATE DIMEGLUMINE 529 MG/ML IV SOLN  COMPARISON:  Head CT without contrast 09/06/2015. Preoperative Cervical spine MRI 08/12/2015  FINDINGS: MRI HEAD FINDINGS  Confluent restricted diffusion throughout most of the right hemisphere. Near complete involvement of the right MCA territory. Right MCA/PCA watershed involvement extending to the lateral occipital pole. Scattered involvement elsewhere in the right PCA territory.  Some of the right temporal lobe also is spared.  Scattered small areas of restricted diffusion also in the left MCA territory, an the right superior cerebellar artery territory.  Major intracranial vascular flow voids are preserved, see MRA findings below.  Confluent cytotoxic edema in the right hemisphere. Areas of petechial hemorrhage in the anterior and posterior right MCA territories. Mass effect on the right lateral ventricle. Mild leftward midline shift of 3 mm. No ventriculomegaly. Basilar cisterns are patent. No extra-axial or intraventricular hemorrhage. Following contrast, no significant post ischemic enhancement.  Minimal other nonspecific cerebral white matter T2 and FLAIR hyperintensity. There is a small chronic lacunar infarct in the right PICA territory (series 6, image 5).  Visible internal auditory structures appear normal. Trace mastoid fluid. Mild paranasal sinus mucosal thickening. The patient is intubated. Trace fluid in the oral cavity. Postoperative changes to the globes. Negative scalp soft tissues.  Hardware susceptibility artifact in the cervical spine. Chronic spinal Stenosis with mass effect on the spinal cord at C2-C3 (series 3, image 13).  MRA NECK FINDINGS  Precontrast time-of-flight imaging. A antegrade flow in both carotid and vertebral arteries in the neck and to the skullbase.  Post-contrast neck MRA images. Three vessel arch configuration. No great vessel origin stenosis.  Negative right CCA proximal to the right carotid bifurcation. At the right ICA origin there is irregularity and stenosis of up to 60 % with respect to the distal vessel (series 1302, image 84 and series 11304, image 5). The cervical right ICA is patent and otherwise appears negative to the skullbase.  Negative left CCA. Irregularity and stenosis at the left ICA origin also measuring up to 60 % with respect to the distal vessel and best seen on series 1302, image 71. The left ICA remains patent and is otherwise  negative to the skullbase.  No proximal subclavian artery stenosis. Normal left vertebral artery origin. Mild if any stenosis at the right vertebral artery origin. The left vertebral artery is mildly dominant throughout.  MRA HEAD FINDINGS  Antegrade flow in the posterior circulation. Dominant distal left vertebral artery. Normal right PICA origin. Dominant appearing left AICA with a patent origin. Patent vertebrobasilar junction. No basilar stenosis. Normal SCA and PCA origins, fetal type on the right. Both posterior communicating arteries are present. Bilateral PCA branches are within normal limits.  Antegrade flow in both ICA siphons. No siphon stenosis. Ophthalmic and posterior communicating artery origins are within normal limits. Small probable atherosclerotic pseudo lesion of the left ICA siphon in the superior hypophyseal region (series 505, image 11). Patent carotid termini. Normal MCA and ACA origins.  Anterior communicating artery and visualized ACA branches are within normal limits. Left MCA M1 segment and bifurcation are within normal limits. No left MCA branch occlusion identified. Right MCA M1 segment and bifurcation are within normal limits. No major right MCA branch occlusion identified.  IMPRESSION: 1. 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 2. Scattered small left MCA and occasional right SCA acute infarcts. 3. Bilateral ICA origin stenosis of up to 60% in the neck. No emergent large vessel occlusion. No intracranial stenosis or MCA branch occlusion identified. 4. Chronic spinal stenosis with spinal cord mass effect at C2-C3. New upper cervical fusion hardware.   Electronically Signed   By: Genevie Ann M.D.   On: 09/08/2015 10:20   Mr Angiogram Neck W Wo Contrast  09/08/2015   CLINICAL DATA:  81 year old male with left side weakness following cervical spine and lumbar  surgery. Initial encounter.  EXAM: MRI HEAD WITHOUT CONTRAST AND WITH CONTRAST  MRA HEAD WITHOUT CONTRAST  MRA NECK WITHOUT AND WITH CONTRAST  TECHNIQUE: Multiplanar, multiecho pulse sequences of the brain and surrounding structures were obtained without intravenous contrast. Angiographic images of the Circle of Willis were obtained using MRA technique without intravenous contrast. Angiographic images of the neck were obtained using MRA technique without and with intravenous contrast. Carotid stenosis measurements (when applicable) are obtained utilizing NASCET criteria, using the distal internal carotid diameter as the denominator.  CONTRAST:  51m MULTIHANCE GADOBENATE DIMEGLUMINE 529 MG/ML IV SOLN  COMPARISON:  Head  CT without contrast 09/06/2015. Preoperative Cervical spine MRI 08/12/2015  FINDINGS: MRI HEAD FINDINGS  Confluent restricted diffusion throughout most of the right hemisphere. Near complete involvement of the right MCA territory. Right MCA/PCA watershed involvement extending to the lateral occipital pole. Scattered involvement elsewhere in the right PCA territory. Some of the right temporal lobe also is spared.  Scattered small areas of restricted diffusion also in the left MCA territory, an the right superior cerebellar artery territory.  Major intracranial vascular flow voids are preserved, see MRA findings below.  Confluent cytotoxic edema in the right hemisphere. Areas of petechial hemorrhage in the anterior and posterior right MCA territories. Mass effect on the right lateral ventricle. Mild leftward midline shift of 3 mm. No ventriculomegaly. Basilar cisterns are patent. No extra-axial or intraventricular hemorrhage. Following contrast, no significant post ischemic enhancement.  Minimal other nonspecific cerebral white matter T2 and FLAIR hyperintensity. There is a small chronic lacunar infarct in the right PICA territory (series 6, image 5).  Visible internal auditory structures appear normal.  Trace mastoid fluid. Mild paranasal sinus mucosal thickening. The patient is intubated. Trace fluid in the oral cavity. Postoperative changes to the globes. Negative scalp soft tissues.  Hardware susceptibility artifact in the cervical spine. Chronic spinal Stenosis with mass effect on the spinal cord at C2-C3 (series 3, image 13).  MRA NECK FINDINGS  Precontrast time-of-flight imaging. A antegrade flow in both carotid and vertebral arteries in the neck and to the skullbase.  Post-contrast neck MRA images. Three vessel arch configuration. No great vessel origin stenosis.  Negative right CCA proximal to the right carotid bifurcation. At the right ICA origin there is irregularity and stenosis of up to 60 % with respect to the distal vessel (series 1302, image 84 and series 11304, image 5). The cervical right ICA is patent and otherwise appears negative to the skullbase.  Negative left CCA. Irregularity and stenosis at the left ICA origin also measuring up to 60 % with respect to the distal vessel and best seen on series 1302, image 71. The left ICA remains patent and is otherwise negative to the skullbase.  No proximal subclavian artery stenosis. Normal left vertebral artery origin. Mild if any stenosis at the right vertebral artery origin. The left vertebral artery is mildly dominant throughout.  MRA HEAD FINDINGS  Antegrade flow in the posterior circulation. Dominant distal left vertebral artery. Normal right PICA origin. Dominant appearing left AICA with a patent origin. Patent vertebrobasilar junction. No basilar stenosis. Normal SCA and PCA origins, fetal type on the right. Both posterior communicating arteries are present. Bilateral PCA branches are within normal limits.  Antegrade flow in both ICA siphons. No siphon stenosis. Ophthalmic and posterior communicating artery origins are within normal limits. Small probable atherosclerotic pseudo lesion of the left ICA siphon in the superior hypophyseal region  (series 505, image 11). Patent carotid termini. Normal MCA and ACA origins.  Anterior communicating artery and visualized ACA branches are within normal limits. Left MCA M1 segment and bifurcation are within normal limits. No left MCA branch occlusion identified. Right MCA M1 segment and bifurcation are within normal limits. No major right MCA branch occlusion identified.  IMPRESSION: 1. 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 2. Scattered small left MCA and occasional right SCA acute infarcts. 3. Bilateral ICA origin stenosis of up to 60% in the neck. No emergent large vessel occlusion.  No intracranial stenosis or MCA branch occlusion identified. 4. Chronic spinal stenosis with spinal cord mass effect at C2-C3. New upper cervical fusion hardware.   Electronically Signed   By: Genevie Ann M.D.   On: 09/08/2015 10:20   Mr Jeri Cos EV Contrast  09/08/2015   CLINICAL DATA:  81 year old male with left side weakness following cervical spine and lumbar surgery. Initial encounter.  EXAM: MRI HEAD WITHOUT CONTRAST AND WITH CONTRAST  MRA HEAD WITHOUT CONTRAST  MRA NECK WITHOUT AND WITH CONTRAST  TECHNIQUE: Multiplanar, multiecho pulse sequences of the brain and surrounding structures were obtained without intravenous contrast. Angiographic images of the Circle of Willis were obtained using MRA technique without intravenous contrast. Angiographic images of the neck were obtained using MRA technique without and with intravenous contrast. Carotid stenosis measurements (when applicable) are obtained utilizing NASCET criteria, using the distal internal carotid diameter as the denominator.  CONTRAST:  91m MULTIHANCE GADOBENATE DIMEGLUMINE 529 MG/ML IV SOLN  COMPARISON:  Head CT without contrast 09/06/2015. Preoperative Cervical spine MRI 08/12/2015  FINDINGS: MRI HEAD FINDINGS  Confluent restricted  diffusion throughout most of the right hemisphere. Near complete involvement of the right MCA territory. Right MCA/PCA watershed involvement extending to the lateral occipital pole. Scattered involvement elsewhere in the right PCA territory. Some of the right temporal lobe also is spared.  Scattered small areas of restricted diffusion also in the left MCA territory, an the right superior cerebellar artery territory.  Major intracranial vascular flow voids are preserved, see MRA findings below.  Confluent cytotoxic edema in the right hemisphere. Areas of petechial hemorrhage in the anterior and posterior right MCA territories. Mass effect on the right lateral ventricle. Mild leftward midline shift of 3 mm. No ventriculomegaly. Basilar cisterns are patent. No extra-axial or intraventricular hemorrhage. Following contrast, no significant post ischemic enhancement.  Minimal other nonspecific cerebral white matter T2 and FLAIR hyperintensity. There is a small chronic lacunar infarct in the right PICA territory (series 6, image 5).  Visible internal auditory structures appear normal. Trace mastoid fluid. Mild paranasal sinus mucosal thickening. The patient is intubated. Trace fluid in the oral cavity. Postoperative changes to the globes. Negative scalp soft tissues.  Hardware susceptibility artifact in the cervical spine. Chronic spinal Stenosis with mass effect on the spinal cord at C2-C3 (series 3, image 13).  MRA NECK FINDINGS  Precontrast time-of-flight imaging. A antegrade flow in both carotid and vertebral arteries in the neck and to the skullbase.  Post-contrast neck MRA images. Three vessel arch configuration. No great vessel origin stenosis.  Negative right CCA proximal to the right carotid bifurcation. At the right ICA origin there is irregularity and stenosis of up to 60 % with respect to the distal vessel (series 1302, image 84 and series 11304, image 5). The cervical right ICA is patent and otherwise appears  negative to the skullbase.  Negative left CCA. Irregularity and stenosis at the left ICA origin also measuring up to 60 % with respect to the distal vessel and best seen on series 1302, image 71. The left ICA remains patent and is otherwise negative to the skullbase.  No proximal subclavian artery stenosis. Normal left vertebral artery origin. Mild if any stenosis at the right vertebral artery origin. The left vertebral artery is mildly dominant throughout.  MRA HEAD FINDINGS  Antegrade flow in the posterior circulation. Dominant distal left vertebral artery. Normal right PICA origin. Dominant appearing left AICA with a patent origin. Patent vertebrobasilar junction. No basilar stenosis. Normal SCA  and PCA origins, fetal type on the right. Both posterior communicating arteries are present. Bilateral PCA branches are within normal limits.  Antegrade flow in both ICA siphons. No siphon stenosis. Ophthalmic and posterior communicating artery origins are within normal limits. Small probable atherosclerotic pseudo lesion of the left ICA siphon in the superior hypophyseal region (series 505, image 11). Patent carotid termini. Normal MCA and ACA origins.  Anterior communicating artery and visualized ACA branches are within normal limits. Left MCA M1 segment and bifurcation are within normal limits. No left MCA branch occlusion identified. Right MCA M1 segment and bifurcation are within normal limits. No major right MCA branch occlusion identified.  IMPRESSION: 1. 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 2. Scattered small left MCA and occasional right SCA acute infarcts. 3. Bilateral ICA origin stenosis of up to 60% in the neck. No emergent large vessel occlusion. No intracranial stenosis or MCA branch occlusion identified. 4. Chronic spinal stenosis with spinal cord mass effect at C2-C3. New upper  cervical fusion hardware.   Electronically Signed   By: Genevie Ann M.D.   On: 09/08/2015 10:20   Ir Gastrostomy Tube Mod Sed  09/24/2015   CLINICAL DATA:  Cerebral infarction and need for gastrostomy tube for long-term nutrition.  EXAM: PERCUTANEOUS GASTROSTOMY TUBE PLACEMENT  ANESTHESIA/SEDATION: 1.0 mg IV Versed; 50 mcg IV Fentanyl.  Total Moderate Sedation Time  20 minutes.  CONTRAST:  29m OMNIPAQUE IOHEXOL 300 MG/ML  SOLN  MEDICATIONS: 1 g IV vancomycin. Vancomycin was given within two hours of incision. Vancomycin was given due to an antibiotic allergy.  FLUOROSCOPY TIME:  7 minutes and 6 seconds.  PROCEDURE: The procedure, risks, benefits, and alternatives were explained to the patient's family. Questions regarding the procedure were encouraged and answered. The patient's grandson understands and consented to the procedure.  The evening prior to the procedure, thin liquid barium was administered through an indwelling post pyloric nasal feeding tube in order to opacify the colon. A 5-French catheter was advanced through the the patient's mouth under fluoroscopy into the esophagus and to the level of the stomach. This catheter was used to insufflate the stomach with air under fluoroscopy.  The abdominal wall was prepped with Betadine in a sterile fashion, and a sterile drape was applied covering the operative field. A sterile gown and sterile gloves were used for the procedure. Local anesthesia was provided with 1% Lidocaine.  A skin incision was made in the upper abdominal wall. Under fluoroscopy, an 18 gauge trocar needle was advanced into the stomach. Contrast injection was performed to confirm intraluminal position of the needle tip. A single T tack was then deployed in the lumen of the stomach. This was brought up to tension at the skin surface.  Over a guidewire, a 9-French sheath was advanced into the lumen of the stomach. The wire was left in place as a safety wire. A loop snare device from a  percutaneous gastrostomy kit was then advanced into the stomach.  A floppy guide wire was advanced through the orogastric catheter under fluoroscopy in the stomach. The loop snare advanced through the percutaneous gastric access was used to snare the guide wire. This allowed withdrawal of the loop snare out of the patient's mouth by retraction of the orogastric catheter and wire.  A 20-French bumper retention gastrostomy tube was looped around the snare device. It was then pulled back  through the patient's mouth. The retention bumper was brought up to the anterior gastric wall. The T tack suture was cut at the skin. The exiting gastrostomy tube was cut to appropriate length and a feeding adapter applied. The catheter was injected with contrast material to confirm position and a fluoroscopic spot image saved. The tube was then flushed with saline. A dressing was applied over the gastrostomy exit site.  COMPLICATIONS: None.  FINDINGS: Initial fluoroscopy demonstrates adequate opacification of the colon by ingested barium in order to prevent colonic injury during the procedure. The stomach distended well with air allowing safe placement of the gastrostomy tube. After placement, the tip of the gastrostomy tube lies in the body of the stomach.  IMPRESSION: Percutaneous gastrostomy with placement of a 20-French bumper retention tube in the body of the stomach. This tube can be used for percutaneous feeds beginning in 24 hours after placement.   Electronically Signed   By: Aletta Edouard M.D.   On: 09/24/2015 11:20   Dg Lumbar Spine 1 View  09/06/2015   CLINICAL DATA:  Laminectomy.  EXAM: LUMBAR SPINE - 1 VIEW  COMPARISON:  None.  FINDINGS: Metallic patch that lumbar vertebra are numbered with the lowest lumbar shaped vertebra as L5. This may be a transitional vertebra. 11 mm anterolisthesis of L4 on L5 noted. Metallic markers noted posteriorly at the L4-L5 level  IMPRESSION: Metallic marker noted posteriorly at the  L4-L5 level.   Electronically Signed   By: Marcello Moores  Register   On: 09/06/2015 13:28   Dg Chest Port 1 View  09/13/2015   CLINICAL DATA:  81 year old male with respiratory failure. Subsequent encounter.  EXAM: PORTABLE CHEST 1 VIEW  COMPARISON:  09/11/2015.  FINDINGS: Poor inspiration with consolidation lung bases greater on left. This may represent atelectasis or infiltrate and without significant change.  Central pulmonary vascular prominence.  Prominent osteophyte mid thoracic vertebra, better evaluated on 08/12/2015 MR.  Mild cardiomegaly.  No gross pneumothorax.  IMPRESSION: Poor inspiration with consolidation lung bases greater on left. This may represent atelectasis or infiltrate and without significant change.  Central pulmonary vascular prominence.  Prominent osteophyte mid thoracic vertebra, better evaluated on 08/12/2015 MR.  Mild cardiomegaly.   Electronically Signed   By: Genia Del M.D.   On: 09/13/2015 07:59   Dg Chest Port 1 View  09/12/2015   CLINICAL DATA:  Fever today.  EXAM: PORTABLE CHEST 1 VIEW  COMPARISON:  09/09/2015.  FINDINGS: Normal sized heart. Decreased patchy and linear density at the right lung base. Stable patchy density at the left lung base. Thoracic spine degenerative changes.  IMPRESSION: 1. Persistent patchy density at the left lung base, suspicious for pneumonia. 2. Decreased atelectasis at the right lung base with residual patchy pneumonia or atelectasis   Electronically Signed   By: Claudie Revering M.D.   On: 09/12/2015 03:04   Dg Chest Port 1 View  09/09/2015   CLINICAL DATA:  Acute respiratory failure  EXAM: PORTABLE CHEST - 1 VIEW  COMPARISON:  09/08/2015  FINDINGS: Low lung volumes are present, causing crowding of the pulmonary vasculature. Airspace opacities at the left lung base and left infrahilar region, slightly worsened. Minimal right basilar airspace opacity medially.  Thoracic spondylosis. Borderline enlargement of the cardiopericardial silhouette.   Endotracheal and nasogastric tubes have been removed.  IMPRESSION: 1. Bibasilar airspace opacities, left greater than right, mildly worsened. 2. Borderline enlargement of the cardiopericardial silhouette.   Electronically Signed   By: Cindra Eves.D.  On: 09/09/2015 08:08   Dg Chest Port 1 View  09/08/2015   CLINICAL DATA:  Acute respiratory failure portable chest x-ray of 09/07/2015  EXAM: PORTABLE CHEST - 1 VIEW  COMPARISON:  None.  FINDINGS: There is persistent basilar atelectasis left-greater-than-right. A small left effusion cannot be excluded. The right lung is grossly clear. Endotracheal tube and NG tube remain, unchanged in position.  IMPRESSION: Persistent left basilar atelectasis.  Stable mild cardiomegaly.   Electronically Signed   By: Ivar Drape M.D.   On: 09/08/2015 08:39   Portable Chest Xray  09/07/2015   CLINICAL DATA:  Respiratory failure, ventilatory support  EXAM: PORTABLE CHEST - 1 VIEW  COMPARISON:  09/06/2015  FINDINGS: Endotracheal tube 7.8 cm above the carina. NG tube enters the stomach with the tip not visualized. Increased left basilar opacity/atelectasis. Right lung remains clear. No enlarging effusion or pneumothorax. Degenerative changes of the spine. Atherosclerosis of the aorta.  IMPRESSION: Stable support apparatus.  Increased left basilar atelectasis   Electronically Signed   By: Jerilynn Mages.  Shick M.D.   On: 09/07/2015 08:20   Dg Chest Port 1 View  09/06/2015   CLINICAL DATA:  Intubation orogastric tube placement  EXAM: PORTABLE CHEST - 1 VIEW  COMPARISON:  08/01/2010  FINDINGS: Endotracheal tube is 3.9 cm above the carina. Orogastric tube crosses the gastroesophageal junction with the. Mild left base opacity suggesting small effusion. Heart size and vascular pattern normal. Right lung clear. Mild atelectasis left lung base.  IMPRESSION: Lines and tubes as described above.   Electronically Signed   By: Skipper Cliche M.D.   On: 09/06/2015 16:45   Dg Abd Portable  1v  09/24/2015   CLINICAL DATA:  Assess the patient for barium prior to gastrostomy tube placement.  EXAM: PORTABLE ABDOMEN - 1 VIEW  COMPARISON:  None.  FINDINGS: There is dense barium throughout the colon. No significant barium remains in the stomach. The feeding tube tip projects over the proximal jejunum. There are a few mildly distended gas-filled small bowel loops evident. There are degenerative changes of the lumbar spine.  IMPRESSION: No significant contrast remains in the stomach or small bowel. There is dense barium in the colon.   Electronically Signed   By: David  Martinique M.D.   On: 09/24/2015 07:48   Dg Abd Portable 1v  09/13/2015   CLINICAL DATA:  Nasogastric tube placement.  EXAM: PORTABLE ABDOMEN - 1 VIEW  COMPARISON:  09/10/2015 and chest x-ray today.  FINDINGS: There has been placement of a enteric tube with tip over the stomach in the left upper quadrant. Bowel gas pattern is nonobstructive. Persistent left base opacification. Moderate degenerative change of the spine.  IMPRESSION: Nonobstructive bowel gas pattern.  Enteric feeding tube with tip over the stomach in the left upper quadrant.   Electronically Signed   By: Marin Olp M.D.   On: 09/13/2015 09:58   Dg Addison Bailey G Tube Plc W/fl-no Rad  09/15/2015   CLINICAL DATA:    NASO G TUBE PLACEMENT WITH FLUORO  Fluoroscopy was utilized by the requesting physician.  No radiographic  interpretation.    Dg Naso G Tube Plc W/fl-no Rad  09/10/2015   CLINICAL DATA:    NASO G TUBE PLACEMENT WITH FLUORO  Fluoroscopy was utilized by the requesting physician.  No radiographic  interpretation.    Dg Swallowing Func-speech Pathology  09/17/2015    Objective Swallowing Evaluation:    Patient Details  Name: KHYREN HING MRN: [id number] Date of Birth: June 06, 1930  Today's Date: 09/17/2015 Time: SLP Start Time (ACUTE ONLY): 1030-SLP Stop Time (ACUTE ONLY): 1115 SLP Time Calculation (min) (ACUTE ONLY): 45 min  Past Medical History:  Past Medical History   Diagnosis Date  . Hypertension   . Diabetes mellitus without complication   . DDD (degenerative disc disease)   . Vertigo   . High cholesterol   . Peripheral vascular disease   . Pneumonia   . History of kidney stones   . Hx of gallstones   . GERD (gastroesophageal reflux disease)   . Headache   . Wears dentures    Past Surgical History:  Past Surgical History  Procedure Laterality Date  . Cholecystectomy    . Skin graft      Left leg  . Vein surgery    . Diagnostic laparoscopy    . Multiple tooth extractions    . Vascular surgery    . Eye surgery Bilateral   . Anterior cervical decomp/discectomy fusion N/A 09/06/2015    Procedure: Anterior Cervical Decompression Fusion Cervical two,Cervical  three,Cervical four-,Cervical five ;  Surgeon: Earnie Larsson, MD;  Location:  [facility];  Service: Neurosurgery;  Laterality: N/A;  . Lumbar laminectomy/decompression microdiscectomy N/A 09/06/2015    Procedure: Lumbar three-four  decompresssive laminectomy  ;  Surgeon:  Earnie Larsson, MD;  Location: [facility];  Service: Neurosurgery;   Laterality: N/A;   HPI:  Other Pertinent Information: [name] is an 81 y.o. male with a  past medical history significant for HTN, DM, hypercholesterolemia, PAD,  GERD, who underwent C2-3 and C4-5 anterior cervical discectomy with  interbody fusion as well as L3-4 decompressive laminectomy with bilateral  L3 and L4 foraminotomies on 9/19, and few hours postoperatively was noted  to have significant left hemiparesis that prompted CT brain which  demonstrated findings concerning for acute ischemia in the right MCA  territory. Pt remained on the vent post-operatively 9/19-9/21.  No Data Recorded  Assessment / Plan / Recommendation CHL IP CLINICAL IMPRESSIONS 09/17/2015  Therapy Diagnosis Severe cervical esophageal phase dysphagia;Severe  pharyngeal phase dysphagia  Clinical Impression Pt demonstrates minimal improvement in function since  prior MBS. Standing secretions have improved and  edema has decreased, but  presence of cervical hardware at C4/5 on top of large bony protrusion  impedes epiglottic deflection and restricts opening of the cervical  esophagus. Thin and nectar thick liquids are significantly aspirated with  insufficient ability to expectorate. Puree textures and honey thick  liquids with large teaspoon boluses are heavy enough to trigger better  opeing, but still significant oropharyngeal residual places pt at high  risk of aspiration. With approval from Dr. Annette Stable the pts hard collar was  removed to attempt a head turn/chin tuck, but pts movement was still  restricted and no benefit was evidenced. If pt and family choose to accept  risk of aspiration, a puree/and honey thick diet could be initiated and  trialed. If the pt wishes for more aggressive measures, then he should  remain NPO. Would not expect any more dramatic improvement in short term  and long term alternate nutrition would be needed, though risk of  aspiration of secretions will still be high.       CHL IP TREATMENT RECOMMENDATION 09/17/2015  Treatment Recommendations Therapy as outlined in treatment plan below     CHL IP DIET RECOMMENDATION 09/17/2015  SLP Diet Recommendations Dysphagia 1 (Puree);Honey  Liquid Administration via (None)  Medication Administration Crushed with puree  Compensations Multiple dry swallows  after each bite/sip  Postural Changes and/or Swallow Maneuvers (None)     CHL IP OTHER RECOMMENDATIONS 09/17/2015  Recommended Consults (None)  Oral Care Recommendations Oral care before and after PO  Other Recommendations (None)     CHL IP FOLLOW UP RECOMMENDATIONS 09/16/2015  Follow up Recommendations Inpatient Rehab     CHL IP FREQUENCY AND DURATION 09/17/2015  Speech Therapy Frequency (ACUTE ONLY) min 2x/week  Treatment Duration 2 weeks     Pertinent Vitals/Pain NA    SLP Swallow Goals No flowsheet data found.  No flowsheet data found.    CHL IP REASON FOR REFERRAL 09/17/2015  Reason for Referral Objectively  evaluate swallowing function     CHL IP ORAL PHASE 09/17/2015  Lips (None)  Tongue (None)  Mucous membranes (None)  Nutritional status (None)  Other (None)  Oxygen therapy (None)  Oral Phase WFL  Oral - Pudding Teaspoon (None)  Oral - Pudding Cup (None)  Oral - Honey Teaspoon (None)  Oral - Honey Cup (None)  Oral - Honey Syringe (None)  Oral - Nectar Teaspoon (None)  Oral - Nectar Cup (None)  Oral - Nectar Straw (None)  Oral - Nectar Syringe (None)  Oral - Ice Chips (None)  Oral - Thin Teaspoon (None)  Oral - Thin Cup (None)  Oral - Thin Straw (None)  Oral - Thin Syringe (None)  Oral - Puree (None)  Oral - Mechanical Soft (None)  Oral - Regular (None)  Oral - Multi-consistency (None)  Oral - Pill (None)  Oral Phase - Comment (None)      CHL IP PHARYNGEAL PHASE 09/17/2015  Pharyngeal Phase Impaired  Pharyngeal - Pudding Teaspoon (None)  Penetration/Aspiration details (pudding teaspoon) (None)  Pharyngeal - Pudding Cup (None)  Penetration/Aspiration details (pudding cup) (None)  Pharyngeal - Honey Teaspoon (None)  Penetration/Aspiration details (honey teaspoon) (None)  Pharyngeal - Honey Cup (None)  Penetration/Aspiration details (honey cup) (None)  Pharyngeal - Honey Syringe (None)  Penetration/Aspiration details (honey syringe) (None)  Pharyngeal - Nectar Teaspoon (None)  Penetration/Aspiration details (nectar teaspoon) (None)  Pharyngeal - Nectar Cup (None)  Penetration/Aspiration details (nectar cup) (None)  Pharyngeal - Nectar Straw (None)  Penetration/Aspiration details (nectar straw) (None)  Pharyngeal - Nectar Syringe (None)  Penetration/Aspiration details (nectar syringe) (None)  Pharyngeal - Ice Chips (None)  Penetration/Aspiration details (ice chips) (None)  Pharyngeal - Thin Teaspoon (None)  Penetration/Aspiration details (thin teaspoon) (None)  Pharyngeal - Thin Cup (None)  Penetration/Aspiration details (thin cup) (None)  Pharyngeal - Thin Straw (None)  Penetration/Aspiration details (thin straw) (None)   Pharyngeal - Thin Syringe (None)  Penetration/Aspiration details (thin syringe') (None)  Pharyngeal - Puree (None)  Penetration/Aspiration details (puree) (None)  Pharyngeal - Mechanical Soft (None)  Penetration/Aspiration details (mechanical soft) (None)  Pharyngeal - Regular (None)  Penetration/Aspiration details (regular) (None)  Pharyngeal - Multi-consistency (None)  Penetration/Aspiration details (multi-consistency) (None)  Pharyngeal - Pill (None)  Penetration/Aspiration details (pill) (None)  Pharyngeal Comment (None)      CHL IP CERVICAL ESOPHAGEAL PHASE 09/17/2015  Cervical Esophageal Phase (No Data)  Pudding Teaspoon (None)  Pudding Cup (None)  Honey Teaspoon (None)  Honey Cup (None)  Honey Straw (None)  Nectar Teaspoon (None)  Nectar Cup (None)  Nectar Straw (None)  Nectar Sippy Cup (None)  Thin Teaspoon (None)  Thin Cup (None)  Thin Straw (None)  Thin Sippy Cup (None)  Cervical Esophageal Comment (None)    No flowsheet data found.        Herbie [place], [place] CCC-SLP [phone]  [place], Katherene Ponto 09/17/2015, 1:14 PM    Dg C-arm 1-60 Min  09/06/2015   CLINICAL DATA:  ACDF C2-3 and C4-5  EXAM: DG C-ARM 61-120 MIN; DG CERVICAL SPINE - 1 VIEW  COMPARISON:  None.  FLUOROSCOPY TIME:  10 seconds  One image  FINDINGS: Single lateral intraoperative fluoroscopic image is provided of the cervical spine. Anterior cervical fusion with interbody spacer device at C2-3 and C4-5.  IMPRESSION: ACDF C2-3 and C4-5.   Electronically Signed   By: Kathreen Devoid   On: 09/06/2015 12:43    Oren Binet, MD  [facility] Pager:[phone]  If 7PM-7AM, please contact night-coverage [email or web address] Password [email or web address] 09/25/2015, 9:50 AM   LOS: 19 days

## 2015-09-25 NOTE — Progress Notes (Signed)
Pt pulled out NG tube.  MD notified.  No new orders.  Will continue to monitor.   Estanislado Emms, RN

## 2015-09-26 LAB — GLUCOSE, CAPILLARY
GLUCOSE-CAPILLARY: 146 mg/dL — AB (ref 65–99)
GLUCOSE-CAPILLARY: 153 mg/dL — AB (ref 65–99)
GLUCOSE-CAPILLARY: 220 mg/dL — AB (ref 65–99)
Glucose-Capillary: 182 mg/dL — ABNORMAL HIGH (ref 65–99)
Glucose-Capillary: 168 mg/dL — ABNORMAL HIGH (ref 65–99)
Glucose-Capillary: 168 mg/dL — ABNORMAL HIGH (ref 65–99)

## 2015-09-26 LAB — BASIC METABOLIC PANEL
Anion gap: 6 (ref 5–15)
BUN: 22 mg/dL — AB (ref 6–20)
CALCIUM: 8.3 mg/dL — AB (ref 8.9–10.3)
CO2: 29 mmol/L (ref 22–32)
Chloride: 99 mmol/L — ABNORMAL LOW (ref 101–111)
Creatinine, Ser: 0.67 mg/dL (ref 0.61–1.24)
GFR calc Af Amer: >60 mL/min (ref >60)
GLUCOSE: 163 mg/dL — AB (ref 65–99)
Potassium: 3.7 mmol/L (ref 3.5–5.1)
Sodium: 134 mmol/L — ABNORMAL LOW (ref 135–145)

## 2015-09-26 LAB — CBC
HEMATOCRIT: 32 % — AB (ref 39.0–52.0)
Hemoglobin: 10.5 g/dL — ABNORMAL LOW (ref 13.0–17.0)
MCH: 30.8 pg (ref 26.0–34.0)
MCHC: 32.8 g/dL (ref 30.0–36.0)
MCV: 93.8 fL (ref 78.0–100.0)
Platelets: 145 10*3/uL — ABNORMAL LOW (ref 150–400)
RBC: 3.41 MIL/uL — ABNORMAL LOW (ref 4.22–5.81)
RDW: 16.5 % — AB (ref 11.5–15.5)
WBC: 7.1 10*3/uL (ref 4.0–10.5)

## 2015-09-26 NOTE — Progress Notes (Signed)
No issues overnight.   EXAM:  BP 152/72 mmHg  Pulse 98  Temp(Src) 98.4 F (36.9 C) (Axillary)  Resp 20  Ht  (1.854 m)  Wt 82.8 kg (182 lb 8.7 oz)  BMI 24.09 kg/m2  SpO2 98%  Awake, alert,  Dysarthric Left hemiplegia  IMPRESSION:  81 y.o. male s/p ACDF/disc with postop CVA, stable neurologically with left hemiplegia   PLAN: - Cont supportive care - TF at goal - Placement

## 2015-09-26 NOTE — Progress Notes (Signed)
[facility] hospitalist consultation follow-up progress note  PATIENT DETAILS Name: [name] Age: 81 y.o. Sex: male Date of Birth: 09-Jul-1930 Admit Date: 09/06/2015 Admitting Physician Earnie Larsson, MD [id number], Betsy Coder, MD   Recommendations from hospitalist service on discharge: Please discharge with following: Metoprolol 100 mg twice a day Eliquis 5 mg twice a day (does not need aspirin anymore) Crestor 10 mg daily Protonix 40 mg daily Metformin 500 mg twice a day Continue sliding scale insulin on discharge Okay to resume methotrexate at prior dose Please ensure patient has a follow-up with vascular surgery for carotid artery stenosis Would discontinue aspirin, HCTZ, propranolol, Micardis (home medications) on discharge  Brief narrative:  [name] is a 81 y.o. male with history of HTN, DM, hypercholesterolemia, PAD, GERD, who presented to [facility] on 9/19 for surgery to remedy progressive bilateral upper and lower extremity numbness from critical cervical and lumbar stenosis. Patient underwent anterior cervical decompression and lumbar decompression with laminectomy on 9/19. Postoperatively, patient remainrf on the ventilator in the intensive care unit. Unfortunately patient was noted to have dense left-sided hemiparesis, and also some concern for angioedema. Further workup demonstrated acute ischemia in the right MCA territory. Hospital course was further complicated by development of atrial fibrillation, presumed aspiration pneumonia and UTI. He unfortunately also developed significant dysphagia, requiring a panda tube. Patient was subsequently transferred to the medical surgical unit on 9/29, and [facility] hospitalists was consulted to manage patient's ongoing medical issues. Unfortunately-dysphagia did not improve-patient underwent PEG tube placement on 10/8. Plans are to discharge to SNF in the next few days.  Significant events during this hospital  stay: 9/19 >>to OR for C2-C3 and C4-5 anterior anterior cervical discectomy with interbody fusion, anterior instrumentation at C2-3 and C4-5, L3-4 decompressive laminectomy with bilateral L3 and l4 foraminotomies. Returned to ICU on vent. 9/19>>Noted to have left hemiparesis-CT head -Suggestive of rt MCA infarct. 9/19 >> 9/21 OETT  9/25>> afib with rvr cards consult 10/7>> G tube placement by IR  10/8>>started Eliquis  Subjective: No major complaints. Lying comfortably in bed. Answers questions appropriately. Still with dense left-sided hemiplegia.  Assessment/Plan: Principal Problem: Spinal stenosis in cervical/Lumbar region:s/p  C2-C3 and C4-5 anterior anterior cervical discectomy with interbody fusion, anterior instrumentation at C2-3 and C4-5, L3-4 decompressive laminectomy with bilateral L3 and l4 foraminotomies.Cervical collar remains in place. Neurosurgery following  Active Problems: Acute Large right MCA CVA with scattered right PCA as well as punctate left MCA and right SCA : Continues to have dense left hemiparesis and hemi-neglect. Echo showed EF around 60-65%, carotid Doppler showed Bilateral ICA origin stenosis of up to 60% in the neck. Given atrial fibrillation, will need to start NOAC's-neurology recommended to wait 2 weeks before initiating anticoagulation as significant risk for hemorrhagic transition. Underwent PEG tube placement on 10/7,will start Eliquis on 10/8 and discontinue ASA.Patient will need outpatient vascular surgery follow-up for carotid artery Doppler monitoring and CEA as indicated. Note LDL 41 (goal<70), A1c 6.1  Atrial fibrillation with RVR: Rate controlled continue metoprolol 100 mg BID and Eliquis. See above regarding anticoagulation. Cardiology following  Escherichia coli UTI: Afebrile, no leukocytosis. Completed Bactrim-on 10/4  Presumed aspiration pneumonia: Afebrile, no leukocytosis, completed course of Levaquin  Oral thrush:Completed course of  diflucan on 10/4   Dysphagia: Multifactorial-suspect predominantly secondary to cervical surgery/hardware position-with contributions critical illness, and possible CVA. Has had numerous SLP evaluation including MBS-recommendations are to continue to  remain nothing by mouth.After discussion with patient, family by this M.D., and the palliative care team-decision was made to place a PEG tube, which was subsequently placed on 10/7. Patient/family aware that patient will continue to be at aspiration risk with or without the PEG tube.   Hypokalemia: Resolved  Hyperkalemia:resolved  Dyslipidemia: Continue Crestor 10 mg on discharge--LDL 41 (goal<70)  Type 2 diabetes: CBGs stable, continue SSI on discharge. Resume metformin on discharge. A1c 6.1  Hypertension: Controlled-continue metoprolol on discharge. Follow and adjust accordingly  Bilateral carotid artery stenosis: See above. Follow-up with vascular surgery on discharge  Tobacco abuse disorder: Counseled  Disposition: Stable for discharge from hospitalist point of view  Antimicrobial agents  See below  Anti-infectives    Start     Dose/Rate Route Frequency Ordered Stop   09/24/15 0930  vancomycin (VANCOCIN) IVPB 1000 mg/200 mL premix     1,000 mg 200 mL/hr over 60 Minutes Intravenous  Once 09/24/15 0925 09/24/15 1059   09/15/15 1015  fluconazole (DIFLUCAN) tablet 100 mg  Status:  Discontinued     100 mg Oral Daily 09/15/15 1007 09/15/15 1008   09/15/15 1015  fluconazole (DIFLUCAN) tablet 100 mg     100 mg Per Tube Daily 09/15/15 1008 09/23/15 0959   09/15/15 1000  levofloxacin (LEVAQUIN) IVPB 750 mg     750 mg 100 mL/hr over 90 Minutes Intravenous Every 24 hours 09/14/15 1157 09/18/15 1202   09/14/15 1300  sulfamethoxazole-trimethoprim (BACTRIM,SEPTRA) 200-40 MG/5ML suspension 20 mL     20 mL Per Tube Every 12 hours 09/14/15 1201 09/22/15 0959   09/12/15 1000  levofloxacin (LEVAQUIN) IVPB 500 mg  Status:  Discontinued     500  mg 100 mL/hr over 60 Minutes Intravenous Every 24 hours 09/12/15 0954 09/14/15 1157   09/10/15 1800  vancomycin (VANCOCIN) 1,250 mg in sodium chloride 0.9 % 250 mL IVPB  Status:  Discontinued     1,250 mg 166.7 mL/hr over 90 Minutes Intravenous Every 24 hours 09/09/15 2024 09/11/15 1122   09/06/15 1830  vancomycin (VANCOCIN) IVPB 1000 mg/200 mL premix  Status:  Discontinued     1,000 mg 200 mL/hr over 60 Minutes Intravenous Every 12 hours 09/06/15 1534 09/09/15 2024   09/06/15 0715  bacitracin 50,000 Units in sodium chloride irrigation 0.9 % 500 mL irrigation  Status:  Discontinued       As needed 09/06/15 0900 09/06/15 1300   09/06/15 0644  vancomycin (VANCOCIN) IVPB 1000 mg/200 mL premix     1,000 mg 200 mL/hr over 60 Minutes Intravenous On call to O.R. 09/06/15 0102 09/06/15 0852      DVT Prophylaxis: Eliquis  Code Status: Full code   Family Communication None at bedside  Procedures: 9/19 >>to OR for C2-C3 and C4-5 anterior anterior cervical discectomy with interbody fusion, anterior instrumentation at C2-3 and C4-5, L3-4 decompressive laminectomy with bilateral L3 and l4 foraminotomies. 9/19 >> 9/21 OETT  10/7>>peg tube placement  CONSULTS:  cardiology, pulmonary/intensive care and Palliative  Time spent 20 minutes-Greater than 50% of this time was spent in counseling, explanation of diagnosis, planning of further management, and coordination of care.  MEDICATIONS: Scheduled Meds: . antiseptic oral rinse  7 mL Mouth Rinse q12n4p  . apixaban  5 mg Per Tube BID  . chlorhexidine  15 mL Mouth Rinse BID  . feeding supplement (PRO-STAT SUGAR FREE 64)  30 mL Per Tube BID  . folic acid  1 mg Oral Daily  . insulin aspart  0-15 Units  Subcutaneous 6 times per day  . metoprolol tartrate  100 mg Oral BID  . pantoprazole (PROTONIX) IV  40 mg Intravenous QHS  . rosuvastatin  10 mg Oral Daily   Continuous Infusions: . feeding supplement (JEVITY 1.2 CAL) 1,000 mL (09/26/15 0457)    PRN Meds:.acetaminophen **OR** acetaminophen, cyclobenzaprine, iohexol, iohexol, iohexol, loperamide, menthol-cetylpyridinium **OR** phenol, metoprolol, midazolam, midazolam, ondansetron (ZOFRAN) IV    PHYSICAL EXAM: Vital signs in last 24 hours: Filed Vitals:   09/25/15 2146 09/26/15 0117 09/26/15 0544 09/26/15 1013  BP: 130/58 110/61 137/63 125/59  Pulse: 95 81 86 92  Temp: 98.4 F (36.9 C) 97.2 F (36.2 C) 98 F (36.7 C) 98 F (36.7 C)  TempSrc: Oral Oral Oral Axillary  Resp: 17 16 18 20   Height:      Weight:   82.8 kg (182 lb 8.7 oz)   SpO2: 100% 98% 96% 98%    Weight change: 1.9 kg (4 lb 3 oz) Filed Weights   09/24/15 0417 09/25/15 0500 09/26/15 0544  Weight: 80.8 kg (178 lb 2.1 oz) 80.9 kg (178 lb 5.6 oz) 82.8 kg (182 lb 8.7 oz)   Body mass index is 24.09 kg/(m^2).   Gen Exam: Awake and alert, cervical collar in place Chest: B/L Clear anteriorly CVS: S1 S2 Regular Abdomen: soft, BS +, non tender, non distended.  Extremities: no edema, lower extremities warm to touch. Neurologic: Dense left hemiparesis-with left hemi-neglect. Skin: No Rash.   Wounds: N/A.    Intake/Output from previous day: No intake or output data in the 24 hours ending 09/26/15 1025   LAB RESULTS: CBC  Recent Labs Lab 09/22/15 0602 09/26/15 0618  WBC 7.6 7.1  HGB 10.9* 10.5*  HCT 33.8* 32.0*  PLT 153 145*  MCV 93.4 93.8  MCH 30.1 30.8  MCHC 32.2 32.8  RDW 16.4* 16.5*    Chemistries   Recent Labs Lab 09/22/15 0602 09/23/15 0614 09/26/15 0618  NA 133* 133* 134*  K 5.3* 4.1 3.7  CL 101 99* 99*  CO2 26 28 29   GLUCOSE 138* 179* 163*  BUN 22* 23* 22*  CREATININE 0.80 0.84 0.67  CALCIUM 8.1* 7.9* 8.3*    CBG:  Recent Labs Lab 09/25/15 1610 09/25/15 1958 09/26/15 0002 09/26/15 0401 09/26/15 0753  GLUCAP 169* 178* 146* 182* 168*    GFR Estimated Creatinine Clearance: 79.1 mL/min (by C-G formula based on Cr of 0.67).  Coagulation profile  Recent Labs Lab  09/24/15 1210  INR 1.19    Cardiac Enzymes No results for input(s): CKMB, TROPONINI, MYOGLOBIN in the last 168 hours.  Invalid input(s): CK  Invalid input(s): POCBNP No results for input(s): DDIMER in the last 72 hours. No results for input(s): HGBA1C in the last 72 hours. No results for input(s): CHOL, HDL, LDLCALC, TRIG, CHOLHDL, LDLDIRECT in the last 72 hours. No results for input(s): TSH, T4TOTAL, T3FREE, THYROIDAB in the last 72 hours.  Invalid input(s): FREET3 No results for input(s): VITAMINB12, FOLATE, FERRITIN, TIBC, IRON, RETICCTPCT in the last 72 hours. No results for input(s): LIPASE, AMYLASE in the last 72 hours.  Urine Studies No results for input(s): UHGB, CRYS in the last 72 hours.  Invalid input(s): UACOL, UAPR, USPG, UPH, UTP, UGL, UKET, UBIL, UNIT, UROB, ULEU, UEPI, UWBC, URBC, UBAC, CAST, UCOM, BILUA  MICROBIOLOGY: No results found for this or any previous visit (from the past 240 hour(s)).  RADIOLOGY STUDIES/RESULTS: Dg Cervical Spine 1 View  09/06/2015   CLINICAL DATA:  ACDF C2-3 and C4-5  EXAM: DG C-ARM 61-120 MIN; DG CERVICAL SPINE - 1 VIEW  COMPARISON:  None.  FLUOROSCOPY TIME:  10 seconds  One image  FINDINGS: Single lateral intraoperative fluoroscopic image is provided of the cervical spine. Anterior cervical fusion with interbody spacer device at C2-3 and C4-5.  IMPRESSION: ACDF C2-3 and C4-5.   Electronically Signed   By: Kathreen Devoid   On: 09/06/2015 12:43   Dg Abd 1 View  09/15/2015   CLINICAL DATA:  Feeding tube placement  EXAM: ABDOMEN - 1 VIEW  COMPARISON:  09/13/2015  TECHNIQUE: Twelve French feeding tube placed under fluoroscopy by Baxter Flattery Dingus RT-R.  20 mL of Omnipaque 300 was utilized to confirm placement.  FLUOROSCOPY TIME:  5 minutes 12 seconds  FINDINGS: Contrast opacifies second third portions the duodenum as well as RIGHT proximal jejunal loop.  Feeding tube is placed with the tip at/beyond ligament of Treitz.  IMPRESSION: Placement of  feeding tube with tip at/beyond ligament of Treitz.   Electronically Signed   By: Lavonia Dana M.D.   On: 09/15/2015 14:49   Dg Abd 1 View  09/10/2015   CLINICAL DATA:  Nasoenteric feeding tube placement by RT  EXAM: ABDOMEN - 1 VIEW  COMPARISON:  06/15/2006  FINDINGS: Feeding tube extends to the ligament of Treitz, confirmed with contrast injection.  IMPRESSION: Feeding tube placement to ligament of Treitz.   Electronically Signed   By: Lucrezia Europe M.D.   On: 09/10/2015 13:26   Ct Head Wo Contrast  09/06/2015   CLINICAL DATA:  Left-sided weakness following cervical surgery  EXAM: CT HEAD WITHOUT CONTRAST  TECHNIQUE: Contiguous axial images were obtained from the base of the skull through the vertex without intravenous contrast.  COMPARISON:  06/27/2013  FINDINGS: The bony calvarium is intact. No gross soft tissue abnormality is seen. Mild atrophic changes are noted. There is some generalized decreased attenuation identified in the distribution of the right middle cerebral artery suggestive of a early ischemic event. MRI may be helpful further evaluation. No other focal areas of ischemia or hemorrhage are noted.  IMPRESSION: Chronic atrophic changes.  Generalized decreased attenuation in the distribution of the right middle cerebral artery suggestive of early ischemia. MRI may be helpful for further evaluation.  These results were called by telephone at the time of interpretation on 09/06/2015 at 5:34 pm to [facility], the pts nurse, who verbally acknowledged these results.   Electronically Signed   By: Inez Catalina M.D.   On: 09/06/2015 17:37   Mr Virgel Paling Wo Contrast  09/08/2015   CLINICAL DATA:  81 year old male with left side weakness following cervical spine and lumbar surgery. Initial encounter.  EXAM: MRI HEAD WITHOUT CONTRAST AND WITH CONTRAST  MRA HEAD WITHOUT CONTRAST  MRA NECK WITHOUT AND WITH CONTRAST  TECHNIQUE: Multiplanar, multiecho pulse sequences of the brain and surrounding structures were  obtained without intravenous contrast. Angiographic images of the Circle of Willis were obtained using MRA technique without intravenous contrast. Angiographic images of the neck were obtained using MRA technique without and with intravenous contrast. Carotid stenosis measurements (when applicable) are obtained utilizing NASCET criteria, using the distal internal carotid diameter as the denominator.  CONTRAST:  80m MULTIHANCE GADOBENATE DIMEGLUMINE 529 MG/ML IV SOLN  COMPARISON:  Head CT without contrast 09/06/2015. Preoperative Cervical spine MRI 08/12/2015  FINDINGS: MRI HEAD FINDINGS  Confluent restricted diffusion throughout most of the right hemisphere. Near complete involvement of the right MCA territory. Right MCA/PCA watershed involvement extending to the lateral occipital  pole. Scattered involvement elsewhere in the right PCA territory. Some of the right temporal lobe also is spared.  Scattered small areas of restricted diffusion also in the left MCA territory, an the right superior cerebellar artery territory.  Major intracranial vascular flow voids are preserved, see MRA findings below.  Confluent cytotoxic edema in the right hemisphere. Areas of petechial hemorrhage in the anterior and posterior right MCA territories. Mass effect on the right lateral ventricle. Mild leftward midline shift of 3 mm. No ventriculomegaly. Basilar cisterns are patent. No extra-axial or intraventricular hemorrhage. Following contrast, no significant post ischemic enhancement.  Minimal other nonspecific cerebral white matter T2 and FLAIR hyperintensity. There is a small chronic lacunar infarct in the right PICA territory (series 6, image 5).  Visible internal auditory structures appear normal. Trace mastoid fluid. Mild paranasal sinus mucosal thickening. The patient is intubated. Trace fluid in the oral cavity. Postoperative changes to the globes. Negative scalp soft tissues.  Hardware susceptibility artifact in the cervical  spine. Chronic spinal Stenosis with mass effect on the spinal cord at C2-C3 (series 3, image 13).  MRA NECK FINDINGS  Precontrast time-of-flight imaging. A antegrade flow in both carotid and vertebral arteries in the neck and to the skullbase.  Post-contrast neck MRA images. Three vessel arch configuration. No great vessel origin stenosis.  Negative right CCA proximal to the right carotid bifurcation. At the right ICA origin there is irregularity and stenosis of up to 60 % with respect to the distal vessel (series 1302, image 84 and series 11304, image 5). The cervical right ICA is patent and otherwise appears negative to the skullbase.  Negative left CCA. Irregularity and stenosis at the left ICA origin also measuring up to 60 % with respect to the distal vessel and best seen on series 1302, image 71. The left ICA remains patent and is otherwise negative to the skullbase.  No proximal subclavian artery stenosis. Normal left vertebral artery origin. Mild if any stenosis at the right vertebral artery origin. The left vertebral artery is mildly dominant throughout.  MRA HEAD FINDINGS  Antegrade flow in the posterior circulation. Dominant distal left vertebral artery. Normal right PICA origin. Dominant appearing left AICA with a patent origin. Patent vertebrobasilar junction. No basilar stenosis. Normal SCA and PCA origins, fetal type on the right. Both posterior communicating arteries are present. Bilateral PCA branches are within normal limits.  Antegrade flow in both ICA siphons. No siphon stenosis. Ophthalmic and posterior communicating artery origins are within normal limits. Small probable atherosclerotic pseudo lesion of the left ICA siphon in the superior hypophyseal region (series 505, image 11). Patent carotid termini. Normal MCA and ACA origins.  Anterior communicating artery and visualized ACA branches are within normal limits. Left MCA M1 segment and bifurcation are within normal limits. No left MCA branch  occlusion identified. Right MCA M1 segment and bifurcation are within normal limits. No major right MCA branch occlusion identified.  IMPRESSION: 1. 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 2. Scattered small left MCA and occasional right SCA acute infarcts. 3. Bilateral ICA origin stenosis of up to 60% in the neck. No emergent large vessel occlusion. No intracranial stenosis or MCA branch occlusion identified. 4. Chronic spinal stenosis with spinal cord mass effect at C2-C3. New upper cervical fusion hardware.   Electronically Signed   By: Genevie Ann M.D.   On: 09/08/2015 10:20  Mr Angiogram Neck W Wo Contrast  09/08/2015   CLINICAL DATA:  81 year old male with left side weakness following cervical spine and lumbar surgery. Initial encounter.  EXAM: MRI HEAD WITHOUT CONTRAST AND WITH CONTRAST  MRA HEAD WITHOUT CONTRAST  MRA NECK WITHOUT AND WITH CONTRAST  TECHNIQUE: Multiplanar, multiecho pulse sequences of the brain and surrounding structures were obtained without intravenous contrast. Angiographic images of the Circle of Willis were obtained using MRA technique without intravenous contrast. Angiographic images of the neck were obtained using MRA technique without and with intravenous contrast. Carotid stenosis measurements (when applicable) are obtained utilizing NASCET criteria, using the distal internal carotid diameter as the denominator.  CONTRAST:  34m MULTIHANCE GADOBENATE DIMEGLUMINE 529 MG/ML IV SOLN  COMPARISON:  Head CT without contrast 09/06/2015. Preoperative Cervical spine MRI 08/12/2015  FINDINGS: MRI HEAD FINDINGS  Confluent restricted diffusion throughout most of the right hemisphere. Near complete involvement of the right MCA territory. Right MCA/PCA watershed involvement extending to the lateral occipital pole. Scattered involvement elsewhere in the right PCA territory.  Some of the right temporal lobe also is spared.  Scattered small areas of restricted diffusion also in the left MCA territory, an the right superior cerebellar artery territory.  Major intracranial vascular flow voids are preserved, see MRA findings below.  Confluent cytotoxic edema in the right hemisphere. Areas of petechial hemorrhage in the anterior and posterior right MCA territories. Mass effect on the right lateral ventricle. Mild leftward midline shift of 3 mm. No ventriculomegaly. Basilar cisterns are patent. No extra-axial or intraventricular hemorrhage. Following contrast, no significant post ischemic enhancement.  Minimal other nonspecific cerebral white matter T2 and FLAIR hyperintensity. There is a small chronic lacunar infarct in the right PICA territory (series 6, image 5).  Visible internal auditory structures appear normal. Trace mastoid fluid. Mild paranasal sinus mucosal thickening. The patient is intubated. Trace fluid in the oral cavity. Postoperative changes to the globes. Negative scalp soft tissues.  Hardware susceptibility artifact in the cervical spine. Chronic spinal Stenosis with mass effect on the spinal cord at C2-C3 (series 3, image 13).  MRA NECK FINDINGS  Precontrast time-of-flight imaging. A antegrade flow in both carotid and vertebral arteries in the neck and to the skullbase.  Post-contrast neck MRA images. Three vessel arch configuration. No great vessel origin stenosis.  Negative right CCA proximal to the right carotid bifurcation. At the right ICA origin there is irregularity and stenosis of up to 60 % with respect to the distal vessel (series 1302, image 84 and series 11304, image 5). The cervical right ICA is patent and otherwise appears negative to the skullbase.  Negative left CCA. Irregularity and stenosis at the left ICA origin also measuring up to 60 % with respect to the distal vessel and best seen on series 1302, image 71. The left ICA remains patent and is otherwise  negative to the skullbase.  No proximal subclavian artery stenosis. Normal left vertebral artery origin. Mild if any stenosis at the right vertebral artery origin. The left vertebral artery is mildly dominant throughout.  MRA HEAD FINDINGS  Antegrade flow in the posterior circulation. Dominant distal left vertebral artery. Normal right PICA origin. Dominant appearing left AICA with a patent origin. Patent vertebrobasilar junction. No basilar stenosis. Normal SCA and PCA origins, fetal type on the right. Both posterior communicating arteries are present. Bilateral PCA branches are within normal limits.  Antegrade flow in both ICA siphons. No siphon stenosis. Ophthalmic and posterior communicating artery origins are within normal limits.  Small probable atherosclerotic pseudo lesion of the left ICA siphon in the superior hypophyseal region (series 505, image 11). Patent carotid termini. Normal MCA and ACA origins.  Anterior communicating artery and visualized ACA branches are within normal limits. Left MCA M1 segment and bifurcation are within normal limits. No left MCA branch occlusion identified. Right MCA M1 segment and bifurcation are within normal limits. No major right MCA branch occlusion identified.  IMPRESSION: 1. 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 2. Scattered small left MCA and occasional right SCA acute infarcts. 3. Bilateral ICA origin stenosis of up to 60% in the neck. No emergent large vessel occlusion. No intracranial stenosis or MCA branch occlusion identified. 4. Chronic spinal stenosis with spinal cord mass effect at C2-C3. New upper cervical fusion hardware.   Electronically Signed   By: Genevie Ann M.D.   On: 09/08/2015 10:20   Mr Jeri Cos RU Contrast  09/08/2015   CLINICAL DATA:  81 year old male with left side weakness following cervical spine and lumbar surgery.  Initial encounter.  EXAM: MRI HEAD WITHOUT CONTRAST AND WITH CONTRAST  MRA HEAD WITHOUT CONTRAST  MRA NECK WITHOUT AND WITH CONTRAST  TECHNIQUE: Multiplanar, multiecho pulse sequences of the brain and surrounding structures were obtained without intravenous contrast. Angiographic images of the Circle of Willis were obtained using MRA technique without intravenous contrast. Angiographic images of the neck were obtained using MRA technique without and with intravenous contrast. Carotid stenosis measurements (when applicable) are obtained utilizing NASCET criteria, using the distal internal carotid diameter as the denominator.  CONTRAST:  93m MULTIHANCE GADOBENATE DIMEGLUMINE 529 MG/ML IV SOLN  COMPARISON:  Head CT without contrast 09/06/2015. Preoperative Cervical spine MRI 08/12/2015  FINDINGS: MRI HEAD FINDINGS  Confluent restricted diffusion throughout most of the right hemisphere. Near complete involvement of the right MCA territory. Right MCA/PCA watershed involvement extending to the lateral occipital pole. Scattered involvement elsewhere in the right PCA territory. Some of the right temporal lobe also is spared.  Scattered small areas of restricted diffusion also in the left MCA territory, an the right superior cerebellar artery territory.  Major intracranial vascular flow voids are preserved, see MRA findings below.  Confluent cytotoxic edema in the right hemisphere. Areas of petechial hemorrhage in the anterior and posterior right MCA territories. Mass effect on the right lateral ventricle. Mild leftward midline shift of 3 mm. No ventriculomegaly. Basilar cisterns are patent. No extra-axial or intraventricular hemorrhage. Following contrast, no significant post ischemic enhancement.  Minimal other nonspecific cerebral white matter T2 and FLAIR hyperintensity. There is a small chronic lacunar infarct in the right PICA territory (series 6, image 5).  Visible internal auditory structures appear normal. Trace  mastoid fluid. Mild paranasal sinus mucosal thickening. The patient is intubated. Trace fluid in the oral cavity. Postoperative changes to the globes. Negative scalp soft tissues.  Hardware susceptibility artifact in the cervical spine. Chronic spinal Stenosis with mass effect on the spinal cord at C2-C3 (series 3, image 13).  MRA NECK FINDINGS  Precontrast time-of-flight imaging. A antegrade flow in both carotid and vertebral arteries in the neck and to the skullbase.  Post-contrast neck MRA images. Three vessel arch configuration. No great vessel origin stenosis.  Negative right CCA proximal to the right carotid bifurcation. At the right ICA origin there is irregularity and stenosis of up to 60 % with respect to the distal vessel (series 1302,  image 84 and series 11304, image 5). The cervical right ICA is patent and otherwise appears negative to the skullbase.  Negative left CCA. Irregularity and stenosis at the left ICA origin also measuring up to 60 % with respect to the distal vessel and best seen on series 1302, image 71. The left ICA remains patent and is otherwise negative to the skullbase.  No proximal subclavian artery stenosis. Normal left vertebral artery origin. Mild if any stenosis at the right vertebral artery origin. The left vertebral artery is mildly dominant throughout.  MRA HEAD FINDINGS  Antegrade flow in the posterior circulation. Dominant distal left vertebral artery. Normal right PICA origin. Dominant appearing left AICA with a patent origin. Patent vertebrobasilar junction. No basilar stenosis. Normal SCA and PCA origins, fetal type on the right. Both posterior communicating arteries are present. Bilateral PCA branches are within normal limits.  Antegrade flow in both ICA siphons. No siphon stenosis. Ophthalmic and posterior communicating artery origins are within normal limits. Small probable atherosclerotic pseudo lesion of the left ICA siphon in the superior hypophyseal region (series  505, image 11). Patent carotid termini. Normal MCA and ACA origins.  Anterior communicating artery and visualized ACA branches are within normal limits. Left MCA M1 segment and bifurcation are within normal limits. No left MCA branch occlusion identified. Right MCA M1 segment and bifurcation are within normal limits. No major right MCA branch occlusion identified.  IMPRESSION: 1. 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 2. Scattered small left MCA and occasional right SCA acute infarcts. 3. Bilateral ICA origin stenosis of up to 60% in the neck. No emergent large vessel occlusion. No intracranial stenosis or MCA branch occlusion identified. 4. Chronic spinal stenosis with spinal cord mass effect at C2-C3. New upper cervical fusion hardware.   Electronically Signed   By: Genevie Ann M.D.   On: 09/08/2015 10:20   Ir Gastrostomy Tube Mod Sed  09/24/2015   CLINICAL DATA:  Cerebral infarction and need for gastrostomy tube for long-term nutrition.  EXAM: PERCUTANEOUS GASTROSTOMY TUBE PLACEMENT  ANESTHESIA/SEDATION: 1.0 mg IV Versed; 50 mcg IV Fentanyl.  Total Moderate Sedation Time  20 minutes.  CONTRAST:  1m OMNIPAQUE IOHEXOL 300 MG/ML  SOLN  MEDICATIONS: 1 g IV vancomycin. Vancomycin was given within two hours of incision. Vancomycin was given due to an antibiotic allergy.  FLUOROSCOPY TIME:  7 minutes and 6 seconds.  PROCEDURE: The procedure, risks, benefits, and alternatives were explained to the patient's family. Questions regarding the procedure were encouraged and answered. The patient's grandson understands and consented to the procedure.  The evening prior to the procedure, thin liquid barium was administered through an indwelling post pyloric nasal feeding tube in order to opacify the colon. A 5-French catheter was advanced through the the patient's mouth under fluoroscopy into the  esophagus and to the level of the stomach. This catheter was used to insufflate the stomach with air under fluoroscopy.  The abdominal wall was prepped with Betadine in a sterile fashion, and a sterile drape was applied covering the operative field. A sterile gown and sterile gloves were used for the procedure. Local anesthesia was provided with 1% Lidocaine.  A skin incision was made in the upper abdominal wall. Under fluoroscopy, an 18 gauge trocar needle was advanced into the stomach. Contrast injection was performed to confirm intraluminal position of the needle tip. A single T tack  was then deployed in the lumen of the stomach. This was brought up to tension at the skin surface.  Over a guidewire, a 9-French sheath was advanced into the lumen of the stomach. The wire was left in place as a safety wire. A loop snare device from a percutaneous gastrostomy kit was then advanced into the stomach.  A floppy guide wire was advanced through the orogastric catheter under fluoroscopy in the stomach. The loop snare advanced through the percutaneous gastric access was used to snare the guide wire. This allowed withdrawal of the loop snare out of the patient's mouth by retraction of the orogastric catheter and wire.  A 20-French bumper retention gastrostomy tube was looped around the snare device. It was then pulled back through the patient's mouth. The retention bumper was brought up to the anterior gastric wall. The T tack suture was cut at the skin. The exiting gastrostomy tube was cut to appropriate length and a feeding adapter applied. The catheter was injected with contrast material to confirm position and a fluoroscopic spot image saved. The tube was then flushed with saline. A dressing was applied over the gastrostomy exit site.  COMPLICATIONS: None.  FINDINGS: Initial fluoroscopy demonstrates adequate opacification of the colon by ingested barium in order to prevent colonic injury during the procedure. The stomach  distended well with air allowing safe placement of the gastrostomy tube. After placement, the tip of the gastrostomy tube lies in the body of the stomach.  IMPRESSION: Percutaneous gastrostomy with placement of a 20-French bumper retention tube in the body of the stomach. This tube can be used for percutaneous feeds beginning in 24 hours after placement.   Electronically Signed   By: Aletta Edouard M.D.   On: 09/24/2015 11:20   Dg Lumbar Spine 1 View  09/06/2015   CLINICAL DATA:  Laminectomy.  EXAM: LUMBAR SPINE - 1 VIEW  COMPARISON:  None.  FINDINGS: Metallic patch that lumbar vertebra are numbered with the lowest lumbar shaped vertebra as L5. This may be a transitional vertebra. 11 mm anterolisthesis of L4 on L5 noted. Metallic markers noted posteriorly at the L4-L5 level  IMPRESSION: Metallic marker noted posteriorly at the L4-L5 level.   Electronically Signed   By: Marcello Moores  Register   On: 09/06/2015 13:28   Dg Chest Port 1 View  09/13/2015   CLINICAL DATA:  81 year old male with respiratory failure. Subsequent encounter.  EXAM: PORTABLE CHEST 1 VIEW  COMPARISON:  09/11/2015.  FINDINGS: Poor inspiration with consolidation lung bases greater on left. This may represent atelectasis or infiltrate and without significant change.  Central pulmonary vascular prominence.  Prominent osteophyte mid thoracic vertebra, better evaluated on 08/12/2015 MR.  Mild cardiomegaly.  No gross pneumothorax.  IMPRESSION: Poor inspiration with consolidation lung bases greater on left. This may represent atelectasis or infiltrate and without significant change.  Central pulmonary vascular prominence.  Prominent osteophyte mid thoracic vertebra, better evaluated on 08/12/2015 MR.  Mild cardiomegaly.   Electronically Signed   By: Genia Del M.D.   On: 09/13/2015 07:59   Dg Chest Port 1 View  09/12/2015   CLINICAL DATA:  Fever today.  EXAM: PORTABLE CHEST 1 VIEW  COMPARISON:  09/09/2015.  FINDINGS: Normal sized heart. Decreased  patchy and linear density at the right lung base. Stable patchy density at the left lung base. Thoracic spine degenerative changes.  IMPRESSION: 1. Persistent patchy density at the left lung base, suspicious for pneumonia. 2. Decreased atelectasis at the right lung base with residual  patchy pneumonia or atelectasis   Electronically Signed   By: Claudie Revering M.D.   On: 09/12/2015 03:04   Dg Chest Port 1 View  09/09/2015   CLINICAL DATA:  Acute respiratory failure  EXAM: PORTABLE CHEST - 1 VIEW  COMPARISON:  09/08/2015  FINDINGS: Low lung volumes are present, causing crowding of the pulmonary vasculature. Airspace opacities at the left lung base and left infrahilar region, slightly worsened. Minimal right basilar airspace opacity medially.  Thoracic spondylosis. Borderline enlargement of the cardiopericardial silhouette.  Endotracheal and nasogastric tubes have been removed.  IMPRESSION: 1. Bibasilar airspace opacities, left greater than right, mildly worsened. 2. Borderline enlargement of the cardiopericardial silhouette.   Electronically Signed   By: Van Clines M.D.   On: 09/09/2015 08:08   Dg Chest Port 1 View  09/08/2015   CLINICAL DATA:  Acute respiratory failure portable chest x-ray of 09/07/2015  EXAM: PORTABLE CHEST - 1 VIEW  COMPARISON:  None.  FINDINGS: There is persistent basilar atelectasis left-greater-than-right. A small left effusion cannot be excluded. The right lung is grossly clear. Endotracheal tube and NG tube remain, unchanged in position.  IMPRESSION: Persistent left basilar atelectasis.  Stable mild cardiomegaly.   Electronically Signed   By: Ivar Drape M.D.   On: 09/08/2015 08:39   Portable Chest Xray  09/07/2015   CLINICAL DATA:  Respiratory failure, ventilatory support  EXAM: PORTABLE CHEST - 1 VIEW  COMPARISON:  09/06/2015  FINDINGS: Endotracheal tube 7.8 cm above the carina. NG tube enters the stomach with the tip not visualized. Increased left basilar opacity/atelectasis.  Right lung remains clear. No enlarging effusion or pneumothorax. Degenerative changes of the spine. Atherosclerosis of the aorta.  IMPRESSION: Stable support apparatus.  Increased left basilar atelectasis   Electronically Signed   By: Jerilynn Mages.  Shick M.D.   On: 09/07/2015 08:20   Dg Chest Port 1 View  09/06/2015   CLINICAL DATA:  Intubation orogastric tube placement  EXAM: PORTABLE CHEST - 1 VIEW  COMPARISON:  08/01/2010  FINDINGS: Endotracheal tube is 3.9 cm above the carina. Orogastric tube crosses the gastroesophageal junction with the. Mild left base opacity suggesting small effusion. Heart size and vascular pattern normal. Right lung clear. Mild atelectasis left lung base.  IMPRESSION: Lines and tubes as described above.   Electronically Signed   By: Skipper Cliche M.D.   On: 09/06/2015 16:45   Dg Abd Portable 1v  09/24/2015   CLINICAL DATA:  Assess the patient for barium prior to gastrostomy tube placement.  EXAM: PORTABLE ABDOMEN - 1 VIEW  COMPARISON:  None.  FINDINGS: There is dense barium throughout the colon. No significant barium remains in the stomach. The feeding tube tip projects over the proximal jejunum. There are a few mildly distended gas-filled small bowel loops evident. There are degenerative changes of the lumbar spine.  IMPRESSION: No significant contrast remains in the stomach or small bowel. There is dense barium in the colon.   Electronically Signed   By: David  Martinique M.D.   On: 09/24/2015 07:48   Dg Abd Portable 1v  09/13/2015   CLINICAL DATA:  Nasogastric tube placement.  EXAM: PORTABLE ABDOMEN - 1 VIEW  COMPARISON:  09/10/2015 and chest x-ray today.  FINDINGS: There has been placement of a enteric tube with tip over the stomach in the left upper quadrant. Bowel gas pattern is nonobstructive. Persistent left base opacification. Moderate degenerative change of the spine.  IMPRESSION: Nonobstructive bowel gas pattern.  Enteric feeding tube with tip over  the stomach in the left upper  quadrant.   Electronically Signed   By: Marin Olp M.D.   On: 09/13/2015 09:58   Dg Addison Bailey G Tube Plc W/fl-no Rad  09/15/2015   CLINICAL DATA:    NASO G TUBE PLACEMENT WITH FLUORO  Fluoroscopy was utilized by the requesting physician.  No radiographic  interpretation.    Dg Naso G Tube Plc W/fl-no Rad  09/10/2015   CLINICAL DATA:    NASO G TUBE PLACEMENT WITH FLUORO  Fluoroscopy was utilized by the requesting physician.  No radiographic  interpretation.    Dg Swallowing Func-speech Pathology  09/17/2015    Objective Swallowing Evaluation:    Patient Details  Name: [name] MRN: [id number] Date of Birth: [birth year]  Today's Date: 09/17/2015 Time: SLP Start Time (ACUTE ONLY): 1030-SLP Stop Time (ACUTE ONLY): 1115 SLP Time Calculation (min) (ACUTE ONLY): 45 min  Past Medical History:  Past Medical History  Diagnosis Date  . Hypertension   . Diabetes mellitus without complication   . DDD (degenerative disc disease)   . Vertigo   . High cholesterol   . Peripheral vascular disease   . Pneumonia   . History of kidney stones   . Hx of gallstones   . GERD (gastroesophageal reflux disease)   . Headache   . Wears dentures    Past Surgical History:  Past Surgical History  Procedure Laterality Date  . Cholecystectomy    . Skin graft      Left leg  . Vein surgery    . Diagnostic laparoscopy    . Multiple tooth extractions    . Vascular surgery    . Eye surgery Bilateral   . Anterior cervical decomp/discectomy fusion N/A 09/06/2015    Procedure: Anterior Cervical Decompression Fusion Cervical two,Cervical  three,Cervical four-,Cervical five ;  Surgeon: Earnie Larsson, MD;  Location:  [facility];  Service: Neurosurgery;  Laterality: N/A;  . Lumbar laminectomy/decompression microdiscectomy N/A 09/06/2015    Procedure: Lumbar three-four  decompresssive laminectomy  ;  Surgeon:  Earnie Larsson, MD;  Location: [facility];  Service: Neurosurgery;   Laterality: N/A;   HPI:  Other Pertinent Information: IVA POSTEN is an 81  y.o. male with a  past medical history significant for HTN, DM, hypercholesterolemia, PAD,  GERD, who underwent C2-3 and C4-5 anterior cervical discectomy with  interbody fusion as well as L3-4 decompressive laminectomy with bilateral  L3 and L4 foraminotomies on 9/19, and few hours postoperatively was noted  to have significant left hemiparesis that prompted CT brain which  demonstrated findings concerning for acute ischemia in the right MCA  territory. Pt remained on the vent post-operatively 9/19-9/21.  No Data Recorded  Assessment / Plan / Recommendation CHL IP CLINICAL IMPRESSIONS 09/17/2015  Therapy Diagnosis Severe cervical esophageal phase dysphagia;Severe  pharyngeal phase dysphagia  Clinical Impression Pt demonstrates minimal improvement in function since  prior MBS. Standing secretions have improved and edema has decreased, but  presence of cervical hardware at C4/5 on top of large bony protrusion  impedes epiglottic deflection and restricts opening of the cervical  esophagus. Thin and nectar thick liquids are significantly aspirated with  insufficient ability to expectorate. Puree textures and honey thick  liquids with large teaspoon boluses are heavy enough to trigger better  opeing, but still significant oropharyngeal residual places pt at high  risk of aspiration. With approval from Dr. Annette Stable the pts hard collar was  removed to attempt a head turn/chin tuck, but  pts movement was still  restricted and no benefit was evidenced. If pt and family choose to accept  risk of aspiration, a puree/and honey thick diet could be initiated and  trialed. If the pt wishes for more aggressive measures, then he should  remain NPO. Would not expect any more dramatic improvement in short term  and long term alternate nutrition would be needed, though risk of  aspiration of secretions will still be high.       CHL IP TREATMENT RECOMMENDATION 09/17/2015  Treatment Recommendations Therapy as outlined in treatment plan below      CHL IP DIET RECOMMENDATION 09/17/2015  SLP Diet Recommendations Dysphagia 1 (Puree);Honey  Liquid Administration via (None)  Medication Administration Crushed with puree  Compensations Multiple dry swallows after each bite/sip  Postural Changes and/or Swallow Maneuvers (None)     CHL IP OTHER RECOMMENDATIONS 09/17/2015  Recommended Consults (None)  Oral Care Recommendations Oral care before and after PO  Other Recommendations (None)     CHL IP FOLLOW UP RECOMMENDATIONS 09/16/2015  Follow up Recommendations Inpatient Rehab     CHL IP FREQUENCY AND DURATION 09/17/2015  Speech Therapy Frequency (ACUTE ONLY) min 2x/week  Treatment Duration 2 weeks     Pertinent Vitals/Pain NA    SLP Swallow Goals No flowsheet data found.  No flowsheet data found.    CHL IP REASON FOR REFERRAL 09/17/2015  Reason for Referral Objectively evaluate swallowing function     CHL IP ORAL PHASE 09/17/2015  Lips (None)  Tongue (None)  Mucous membranes (None)  Nutritional status (None)  Other (None)  Oxygen therapy (None)  Oral Phase WFL  Oral - Pudding Teaspoon (None)  Oral - Pudding Cup (None)  Oral - Honey Teaspoon (None)  Oral - Honey Cup (None)  Oral - Honey Syringe (None)  Oral - Nectar Teaspoon (None)  Oral - Nectar Cup (None)  Oral - Nectar Straw (None)  Oral - Nectar Syringe (None)  Oral - Ice Chips (None)  Oral - Thin Teaspoon (None)  Oral - Thin Cup (None)  Oral - Thin Straw (None)  Oral - Thin Syringe (None)  Oral - Puree (None)  Oral - Mechanical Soft (None)  Oral - Regular (None)  Oral - Multi-consistency (None)  Oral - Pill (None)  Oral Phase - Comment (None)      CHL IP PHARYNGEAL PHASE 09/17/2015  Pharyngeal Phase Impaired  Pharyngeal - Pudding Teaspoon (None)  Penetration/Aspiration details (pudding teaspoon) (None)  Pharyngeal - Pudding Cup (None)  Penetration/Aspiration details (pudding cup) (None)  Pharyngeal - Honey Teaspoon (None)  Penetration/Aspiration details (honey teaspoon) (None)  Pharyngeal - Honey Cup (None)   Penetration/Aspiration details (honey cup) (None)  Pharyngeal - Honey Syringe (None)  Penetration/Aspiration details (honey syringe) (None)  Pharyngeal - Nectar Teaspoon (None)  Penetration/Aspiration details (nectar teaspoon) (None)  Pharyngeal - Nectar Cup (None)  Penetration/Aspiration details (nectar cup) (None)  Pharyngeal - Nectar Straw (None)  Penetration/Aspiration details (nectar straw) (None)  Pharyngeal - Nectar Syringe (None)  Penetration/Aspiration details (nectar syringe) (None)  Pharyngeal - Ice Chips (None)  Penetration/Aspiration details (ice chips) (None)  Pharyngeal - Thin Teaspoon (None)  Penetration/Aspiration details (thin teaspoon) (None)  Pharyngeal - Thin Cup (None)  Penetration/Aspiration details (thin cup) (None)  Pharyngeal - Thin Straw (None)  Penetration/Aspiration details (thin straw) (None)  Pharyngeal - Thin Syringe (None)  Penetration/Aspiration details (thin syringe') (None)  Pharyngeal - Puree (None)  Penetration/Aspiration details (puree) (None)  Pharyngeal - Mechanical Soft (None)  Penetration/Aspiration details (mechanical soft) (  None)  Pharyngeal - Regular (None)  Penetration/Aspiration details (regular) (None)  Pharyngeal - Multi-consistency (None)  Penetration/Aspiration details (multi-consistency) (None)  Pharyngeal - Pill (None)  Penetration/Aspiration details (pill) (None)  Pharyngeal Comment (None)      CHL IP CERVICAL ESOPHAGEAL PHASE 09/17/2015  Cervical Esophageal Phase (No Data)  Pudding Teaspoon (None)  Pudding Cup (None)  Honey Teaspoon (None)  Honey Cup (None)  Honey Straw (None)  Nectar Teaspoon (None)  Nectar Cup (None)  Nectar Straw (None)  Nectar Sippy Cup (None)  Thin Teaspoon (None)  Thin Cup (None)  Thin Straw (None)  Thin Sippy Cup (None)  Cervical Esophageal Comment (None)    No flowsheet data found.        Herbie Baltimore, MA CCC-SLP [phone]  Lynann Beaver 09/17/2015, 1:14 PM    Dg C-arm 1-60 Min  09/06/2015   CLINICAL DATA:  ACDF C2-3 and  C4-5  EXAM: DG C-ARM 61-120 MIN; DG CERVICAL SPINE - 1 VIEW  COMPARISON:  None.  FLUOROSCOPY TIME:  10 seconds  One image  FINDINGS: Single lateral intraoperative fluoroscopic image is provided of the cervical spine. Anterior cervical fusion with interbody spacer device at C2-3 and C4-5.  IMPRESSION: ACDF C2-3 and C4-5.   Electronically Signed   By: Kathreen Devoid   On: 09/06/2015 12:43    Oren Binet, MD  [facility] Pager:[phone]  If 7PM-7AM, please contact night-coverage [email or web address] Password [email or web address] 09/26/2015, 10:25 AM   LOS: 20 days

## 2015-09-27 LAB — GLUCOSE, CAPILLARY
GLUCOSE-CAPILLARY: 166 mg/dL — AB (ref 65–99)
GLUCOSE-CAPILLARY: 154 mg/dL — AB (ref 65–99)
Glucose-Capillary: 230 mg/dL — ABNORMAL HIGH (ref 65–99)
Glucose-Capillary: 254 mg/dL — ABNORMAL HIGH (ref 65–99)

## 2015-09-27 MED ORDER — PANTOPRAZOLE SODIUM 40 MG PO PACK
40.0000 mg | PACK | Freq: Every day | ORAL | Status: DC
  Filled 2015-09-27: qty 20

## 2015-09-27 MED ORDER — GLUCERNA 1.2 CAL PO LIQD: 1000.0000 mL | ORAL | Status: AC

## 2015-09-27 MED ORDER — PANTOPRAZOLE SODIUM 40 MG PO PACK: 40.0000 mg | PACK | Freq: Every day | ORAL | Status: AC

## 2015-09-27 MED ORDER — GLUCERNA 1.2 CAL PO LIQD
1000.0000 mL | ORAL | Status: DC
  Filled 2015-09-27 (×3): qty 1000

## 2015-09-27 MED ORDER — INSULIN ASPART 100 UNIT/ML ~~LOC~~ SOLN: SUBCUTANEOUS | Status: AC

## 2015-09-27 MED ORDER — PRO-STAT SUGAR FREE PO LIQD: 30.0000 mL | Freq: Two times a day (BID) | ORAL | Status: AC

## 2015-09-27 MED ORDER — JEVITY 1.2 CAL PO LIQD: 1000.0000 mL | ORAL | Status: AC

## 2015-09-27 MED ORDER — METOPROLOL TARTRATE 25 MG/10 ML ORAL SUSPENSION: 100.0000 mg | Freq: Two times a day (BID) | ORAL | Status: AC

## 2015-09-27 MED ORDER — APIXABAN 5 MG PO TABS: 5.0000 mg | ORAL_TABLET | Freq: Two times a day (BID) | ORAL | Status: AC

## 2015-09-27 NOTE — Care Management Note (Signed)
Case Management Note  Patient Details  Name: [name] MRN: [id number] Date of Birth: 19-Jan-1930  Subjective/Objective:                    Action/Plan: Plan is to discharge to SNF. CM will continue to follow for discharge needs.   Expected Discharge Date:                  Expected Discharge Plan:  Skilled Nursing Facility  In-House Referral:     Discharge planning Services  CM Consult  Post Acute Care Choice:    Choice offered to:     DME Arranged:    DME Agency:     HH Arranged:    HH Agency:     Status of Service:  Completed, signed off  Medicare Important Message Given:  Yes-fourth notification given Date Medicare IM Given:    Medicare IM give by:    Date Additional Medicare IM Given:    Additional Medicare Important Message give by:     If discussed at Long Length of Stay Meetings, dates discussed:    Additional Comments:  Kermit Balo, RN 09/27/2015, 9:37 AM

## 2015-09-27 NOTE — Progress Notes (Signed)
Pt discharged to skilled nursing facility. Left unit via stretcher at 1450. Called report to Perdita at [facility]. Faxing discharge summary to facility as it was not available at time of transport.

## 2015-09-27 NOTE — Care Management Important Message (Signed)
Important Message  Patient Details  Name: [name] MRN: [id number] Date of Birth: May 05, 1930   Medicare Important Message Given:  Yes-fourth notification given    [name] 09/27/2015, 2:04 PM

## 2015-09-27 NOTE — Clinical Social Work Placement (Signed)
   CLINICAL SOCIAL WORK PLACEMENT  NOTE  Date:  09/27/2015  Patient Details  Name: [name] MRN: [id number] Date of Birth: Feb 02, 1930  Clinical Social Work is seeking post-discharge placement for this patient at the Skilled  Nursing Facility level of care (*CSW will initial, date and re-position this form in  chart as items are completed):  Yes   Patient/family provided with [facility] Clinical Social Work Department's list of facilities offering this level of care within the geographic area requested by the patient (or if unable, by the patient's family).  Yes   Patient/family informed of their freedom to choose among providers that offer the needed level of care, that participate in Medicare, Medicaid or managed care program needed by the patient, have an available bed and are willing to accept the patient.  Yes   Patient/family informed of [facility]'s ownership interest in [facility] and [facility], as well as of the fact that they are under no obligation to receive care at these facilities.  PASRR submitted to EDS on 09/14/15     PASRR number received on 09/14/15     Existing PASRR number confirmed on       FL2 transmitted to all facilities in geographic area requested by pt/family on 09/14/15     FL2 transmitted to all facilities within larger geographic area on       Patient informed that his/her managed care company has contracts with or will negotiate with certain facilities, including the following:        Yes   Patient/family informed of bed offers received.  Patient chooses bed at  [facility] )     Physician recommends and patient chooses bed at      Patient to be transferred to  [facility] ) on 09/27/15.  Patient to be transferred to facility by  [facility] )     Patient family notified on 09/27/15 of transfer.  Name of family member notified:   (Pt's grandson, Shell Yandow via telephone )     PHYSICIAN Please sign FL2      Additional Comment:    _______________________________________________ Derenda Fennel, MSW, LCSWA [phone] 09/27/2015 10:03 AM

## 2015-09-27 NOTE — Clinical Social Work Note (Addendum)
Clinical Social Worker facilitated patient discharge including contacting patient family and facility to confirm patient discharge plans.  Clinical information faxed to facility and family agreeable with plan.  CSW arranged ambulance transport via [facility] to [facility].  RN to call report prior to discharge.  DC packet prepared and on chart for transport with number for report. Pt's grandson updated with transport time.   Clinical Social Worker will sign off for now as social work intervention is no longer needed. Please consult [place] again if new need arises.  Derenda Fennel, MSW, LCSWA [phone] 09/27/2015 2:09 PM

## 2015-09-27 NOTE — Progress Notes (Signed)
Nutrition Follow-up   INTERVENTION:  Change TF formula to Glucerna 1.2.  Provide Glucerna 1.2 @ 75 ml/hr via PEG to provide 2160 kcal, 108 grams of protein, and 1449 ml of water. (This will decrease carbohydrates from 283 grams daily to 202 grams daily)  When sodium and chloride are WNL, add 100 ml free water flushes every 4 hours to provide an additional 600 ml daily; total free water in TF regimen will provide 2049 ml.    NUTRITION DIAGNOSIS:   Inadequate oral intake related to inability to eat as evidenced by NPO status.  Ongoing  GOAL:   Patient will meet greater than or equal to 90% of their needs  Met  MONITOR:   TF tolerance, Labs, I & O's, Weight trends  REASON FOR ASSESSMENT:   Consult Enteral/tube feeding initiation and management  ASSESSMENT:   81 year old male with PMH DM, HTN, GERD who had a posterior approach laminectomy then post op patient was unresponsive with facial swelling and a rash so decision was made to leave patient intubated.  Pt had gastrostomy tube placed on 10/7. Tube feeds were re-started on 10/8 at goal rate. 0 ml residual last night per nursing notes. Current TF regimen only provides 1360 ml of free water; however, sodium and chloride are low.  Labs: elevated glucose ranging 138 to 254 mg/dL, low sodium, low chloride  Diet Order:  Diet NPO time specified  Skin:  Wound (see comment) (closed incision on neck and back)  Last BM:  10/9  Height:   Ht Readings from Last 1 Encounters:  09/13/15 _0  (1.854 m)    Weight:   Wt Readings from Last 1 Encounters:  09/27/15 182 lb 8.7 oz (82.8 kg)    Ideal Body Weight:  83.6 kg  BMI:  Body mass index is 24.09 kg/(m^2).  Estimated Nutritional Needs:   Kcal:  2000-2200  Protein:  100-115 grams  Fluid:  2-2.2 L/day  EDUCATION NEEDS:   No education needs identified at this time  [place], LDN Inpatient Clinical Dietitian Pager: [phone] After Hours Pager:  [phone]

## 2015-09-27 NOTE — Discharge Summary (Signed)
Physician Discharge Summary  Patient ID: BRYCEN BEAN MRN: [id number] DOB/AGE: March 21, 1930 81 y.o.  Admit date: 09/06/2015 Discharge date: 09/27/2015  Admission Diagnoses: cervical spinal stenosis  Discharge Diagnoses: Same Principal Problem:   Spinal stenosis in cervical region Active Problems:   Spinal stenosis, lumbar region, with neurogenic claudication   Acute right MCA stroke (HCC)   Cerebral embolism with cerebral infarction (HCC)   Carotid stenosis   Tobacco use disorder   HLD (hyperlipidemia)   Paroxysmal atrial fibrillation (HCC)   Hypertensive heart disease   Acute respiratory failure (HCC)   E-coli UTI   Hypokalemia   Bilateral pneumonia   Atrial fibrillation (HCC)   Acute left hemiparesis (HCC)   Goals of care, counseling/discussion   Palliative care encounter   Dysphagia   Discharged Condition: Stable  Hospital Course:  Mrs. TABITHA TUPPER is a 81 y.o. male initially admitted after elective ACDF and lumbar discectomy. Postop he suffered a right likely watershed-type stroke with left hemiplegia. He was in the ICU initially intubated. He had complication of Afib, aspiration pna, and UTI. He was treated with abx, and started on anticoagulation. He had dysphagia which did not improve and underwent PEG placement.  Treatments: Surgery - C23, C45 ACDF, L3-4 foramintomy  Discharge Exam: Blood pressure 131/69, pulse 113, temperature 99.9 F (37.7 C), temperature source Axillary, resp. rate 20, height  (1.854 m), weight 82.8 kg (182 lb 8.7 oz), SpO2 95 %. Awake, alert, oriented Speech fluent, appropriate CN grossly intact 5/5 BUE/BLE Wound c/d/i  Disposition: SNF      Discharge Instructions    Ambulatory referral to Neurology    Complete by:  As directed   Pt will follow up with Dr. Pearlean Brownie at [facility] in about 2 months. Thanks.            Medication List    STOP taking these medications        aspirin EC 81 MG tablet     folic acid 1 MG  tablet  Commonly known as:  FOLVITE     hydrochlorothiazide 25 MG tablet  Commonly known as:  HYDRODIURIL     oxyCODONE-acetaminophen 5-325 MG tablet  Commonly known as:  PERCOCET/ROXICET     propranolol 80 MG 24 hr capsule  Commonly known as:  INNOPRAN XL     telmisartan 80 MG tablet  Commonly known as:  MICARDIS      TAKE these medications        apixaban 5 MG Tabs tablet  Commonly known as:  ELIQUIS  Place 1 tablet (5 mg total) into feeding tube 2 (two) times daily.     feeding supplement (JEVITY 1.2 CAL) Liqd  Place 1,000 mLs into feeding tube continuous. 70 mL/hr, Continuous     feeding supplement (PRO-STAT SUGAR FREE 64) Liqd  Place 30 mLs into feeding tube 2 (two) times daily.     insulin aspart 100 UNIT/ML injection  Commonly known as:  novoLOG  0-15 Units, Subcutaneous, Every 4 hours (6 times per day), First dose on Mon 09/06/15 at 1600 Correction coverage: Moderate (average weight, post-op) CBG < 70: implement hypoglycemia protocol CBG 70 - 120: 0 units CBG 121 - 150: 2 units CBG 151 - 200: 3 units CBG 201 - 250: 5 units CBG 251 - 300: 8 units CBG 301 - 350: 11 units CBG 351 - 400: 15 units CBG > 400: call MD     metFORMIN 500 MG tablet  Commonly known as:  GLUCOPHAGE  Take 500 mg by mouth 2 (two) times daily.     methotrexate 2.5 MG tablet  Take 20 mg by mouth every Saturday. Takes every saturday     metoprolol tartrate 25 mg/10 mL Susp  Commonly known as:  LOPRESSOR  Take 40 mLs (100 mg total) by mouth 2 (two) times daily.     pantoprazole sodium 40 mg/20 mL Pack  Commonly known as:  PROTONIX  Place 20 mLs (40 mg total) into feeding tube daily.     pantoprazole sodium 40 mg/20 mL Pack  Commonly known as:  PROTONIX  Place 20 mLs (40 mg total) into feeding tube at bedtime.     rosuvastatin 10 MG tablet  Commonly known as:  CRESTOR  Take 10 mg by mouth daily.       Follow-up Information    Follow up with SETHI,PRAMOD, MD In 2 months.   Specialties:   Neurology, Radiology   Why:  stroke clinic   Contact information:   [address] [place] [address] [phone]       Follow up with Georga Hacking, MD. Schedule an appointment as soon as possible for a visit in 2 weeks.   Specialty:  Cardiology   Why:  for post hospital discharge follow up for Afib   Contact information:   [address] [place] [address] [phone]       Follow up with [facility].   Why:  Post hospital discharge-for evaluation and follow up of carotid stenosis   Contact information:   [address] [place] [address] [phone]      Follow up with Temple Pacini, MD In 4 weeks.   Specialty:  Neurosurgery   Contact information:   [address] [place] [address] [phone]       Signed: Lisbeth Renshaw, Salena Saner 09/27/2015, 2:04 PM

## 2015-09-27 NOTE — Progress Notes (Signed)
[facility] hospitalist consultation follow-up progress note  PATIENT DETAILS Name: [name] Age: 81 y.o. Sex: male Date of Birth: 1930-07-15 Admit Date: 09/06/2015 Admitting Physician Earnie Larsson, MD DTO:[email or web address], Betsy Coder, MD  Hospitalist service will sign off. Please reconsult prn  Recommendations from hospitalist service on discharge: Please discharge with following: Metoprolol 100 mg twice a day Eliquis 5 mg twice a day (does not need aspirin anymore) Crestor 10 mg daily Protonix 40 mg daily Metformin 500 mg twice a day Continue sliding scale insulin on discharge Okay to resume methotrexate at prior dose Please ensure patient has a follow-up with vascular surgery for carotid artery stenosis Would discontinue aspirin, HCTZ, propranolol, Micardis (home medications) on discharge  Brief narrative:  [name] is a 81 y.o. male with history of HTN, DM, hypercholesterolemia, PAD, GERD, who presented to [facility] on 9/19 for surgery to remedy progressive bilateral upper and lower extremity numbness from critical cervical and lumbar stenosis. Patient underwent anterior cervical decompression and lumbar decompression with laminectomy on 9/19. Postoperatively, patient remainrf on the ventilator in the intensive care unit. Unfortunately patient was noted to have dense left-sided hemiparesis, and also some concern for angioedema. Further workup demonstrated acute ischemia in the right MCA territory. Hospital course was further complicated by development of atrial fibrillation, presumed aspiration pneumonia and UTI. He unfortunately also developed significant dysphagia, requiring a panda tube. Patient was subsequently transferred to the medical surgical unit on 9/29, and [facility] hospitalists was consulted to manage patient's ongoing medical issues. Unfortunately-dysphagia did not improve-patient underwent PEG tube placement on 10/8. Plans are to discharge to SNF in the  next few days.  Significant events during this hospital stay: 9/19 >>to OR for C2-C3 and C4-5 anterior anterior cervical discectomy with interbody fusion, anterior instrumentation at C2-3 and C4-5, L3-4 decompressive laminectomy with bilateral L3 and l4 foraminotomies. Returned to ICU on vent. 9/19>>Noted to have left hemiparesis-CT head -Suggestive of rt MCA infarct. 9/19 >> 9/21 OETT  9/25>> afib with rvr cards consult 10/7>> G tube placement by IR  10/8>>started Eliquis  Subjective: No major complaints. Lying comfortably in bed. Answers questions appropriately. Still with dense left-sided hemiplegia.  Assessment/Plan: Principal Problem: Spinal stenosis in cervical/Lumbar region:s/p  C2-C3 and C4-5 anterior anterior cervical discectomy with interbody fusion, anterior instrumentation at C2-3 and C4-5, L3-4 decompressive laminectomy with bilateral L3 and l4 foraminotomies.Cervical collar remains in place. Neurosurgery following  Active Problems: Acute Large right MCA CVA with scattered right PCA as well as punctate left MCA and right SCA : Continues to have dense left hemiparesis and hemi-neglect. Echo showed EF around 60-65%, carotid Doppler showed Bilateral ICA origin stenosis of up to 60% in the neck. Given atrial fibrillation, will need to start NOAC's-neurology recommended to wait 2 weeks before initiating anticoagulation as significant risk for hemorrhagic transition. Underwent PEG tube placement on 10/7,will start Eliquis on 10/8 and discontinue ASA.Patient will need outpatient vascular surgery follow-up for carotid artery Doppler monitoring and CEA as indicated. Note LDL 41 (goal<70), A1c 6.1  Atrial fibrillation with RVR: Rate controlled continue metoprolol 100 mg BID and Eliquis. See above regarding anticoagulation. Cardiology following  Escherichia coli UTI: Afebrile, no leukocytosis. Completed Bactrim-on 10/4  Presumed aspiration pneumonia: Afebrile, no leukocytosis, completed  course of Levaquin  Oral thrush:Completed course of diflucan on 10/4   Dysphagia: Multifactorial-suspect predominantly secondary to cervical surgery/hardware position-with contributions critical illness, and possible CVA. Has had numerous  SLP evaluation including MBS-recommendations are to continue to remain nothing by mouth.After discussion with patient, family by this M.D., and the palliative care team-decision was made to place a PEG tube, which was subsequently placed on 10/7. Patient/family aware that patient will continue to be at aspiration risk with or without the PEG tube.   Hypokalemia: Resolved  Hyperkalemia:resolved  Dyslipidemia: Continue Crestor 10 mg on discharge--LDL 41 (goal<70)  Type 2 diabetes: CBGs stable, continue SSI on discharge. Resume metformin on discharge. A1c 6.1  Hypertension: Controlled-continue metoprolol on discharge. Follow and adjust accordingly  Bilateral carotid artery stenosis: See above. Follow-up with vascular surgery on discharge  Tobacco abuse disorder: Counseled  Disposition: Stable for discharge from hospitalist point of view  Hospitalist service will sign off.  Antimicrobial agents  See below  Anti-infectives    Start     Dose/Rate Route Frequency Ordered Stop   09/24/15 0930  vancomycin (VANCOCIN) IVPB 1000 mg/200 mL premix     1,000 mg 200 mL/hr over 60 Minutes Intravenous  Once 09/24/15 0925 09/24/15 1059   09/15/15 1015  fluconazole (DIFLUCAN) tablet 100 mg  Status:  Discontinued     100 mg Oral Daily 09/15/15 1007 09/15/15 1008   09/15/15 1015  fluconazole (DIFLUCAN) tablet 100 mg     100 mg Per Tube Daily 09/15/15 1008 09/23/15 0959   09/15/15 1000  levofloxacin (LEVAQUIN) IVPB 750 mg     750 mg 100 mL/hr over 90 Minutes Intravenous Every 24 hours 09/14/15 1157 09/18/15 1202   09/14/15 1300  sulfamethoxazole-trimethoprim (BACTRIM,SEPTRA) 200-40 MG/5ML suspension 20 mL     20 mL Per Tube Every 12 hours 09/14/15 1201 09/22/15  0959   09/12/15 1000  levofloxacin (LEVAQUIN) IVPB 500 mg  Status:  Discontinued     500 mg 100 mL/hr over 60 Minutes Intravenous Every 24 hours 09/12/15 0954 09/14/15 1157   09/10/15 1800  vancomycin (VANCOCIN) 1,250 mg in sodium chloride 0.9 % 250 mL IVPB  Status:  Discontinued     1,250 mg 166.7 mL/hr over 90 Minutes Intravenous Every 24 hours 09/09/15 2024 09/11/15 1122   09/06/15 1830  vancomycin (VANCOCIN) IVPB 1000 mg/200 mL premix  Status:  Discontinued     1,000 mg 200 mL/hr over 60 Minutes Intravenous Every 12 hours 09/06/15 1534 09/09/15 2024   09/06/15 0715  bacitracin 50,000 Units in sodium chloride irrigation 0.9 % 500 mL irrigation  Status:  Discontinued       As needed 09/06/15 0900 09/06/15 1300   09/06/15 0644  vancomycin (VANCOCIN) IVPB 1000 mg/200 mL premix     1,000 mg 200 mL/hr over 60 Minutes Intravenous On call to O.R. 09/06/15 9485 09/06/15 0852      DVT Prophylaxis: Eliquis  Code Status: Full code   Family Communication None at bedside  Procedures: 9/19 >>to OR for C2-C3 and C4-5 anterior anterior cervical discectomy with interbody fusion, anterior instrumentation at C2-3 and C4-5, L3-4 decompressive laminectomy with bilateral L3 and l4 foraminotomies. 9/19 >> 9/21 OETT  10/7>>peg tube placement  CONSULTS:  cardiology, pulmonary/intensive care and Palliative  Time spent 20 minutes-Greater than 50% of this time was spent in counseling, explanation of diagnosis, planning of further management, and coordination of care.  MEDICATIONS: Scheduled Meds: . antiseptic oral rinse  7 mL Mouth Rinse q12n4p  . apixaban  5 mg Per Tube BID  . chlorhexidine  15 mL Mouth Rinse BID  . feeding supplement (PRO-STAT SUGAR FREE 64)  30 mL Per Tube BID  .  folic acid  1 mg Oral Daily  . insulin aspart  0-15 Units Subcutaneous 6 times per day  . metoprolol tartrate  100 mg Oral BID  . pantoprazole (PROTONIX) IV  40 mg Intravenous QHS  . rosuvastatin  10 mg Oral Daily     Continuous Infusions: . feeding supplement (JEVITY 1.2 CAL) 1,000 mL (09/26/15 1839)   PRN Meds:.acetaminophen **OR** acetaminophen, cyclobenzaprine, iohexol, iohexol, iohexol, loperamide, menthol-cetylpyridinium **OR** phenol, metoprolol, midazolam, midazolam, ondansetron (ZOFRAN) IV    PHYSICAL EXAM: Vital signs in last 24 hours: Filed Vitals:   09/26/15 2140 09/27/15 0125 09/27/15 0613 09/27/15 0914  BP: 126/39 129/58 121/62 131/69  Pulse: 121 102 106 113  Temp: 97.7 F (36.5 C) 99.9 F (37.7 C) 98.9 F (37.2 C) 99.9 F (37.7 C)  TempSrc: Axillary Oral Oral Axillary  Resp: 19 18 17 20   Height:      Weight:   82.8 kg (182 lb 8.7 oz)   SpO2: 99% 93% 96% 95%    Weight change: 0 kg (0 lb) Filed Weights   09/25/15 0500 09/26/15 0544 09/27/15 0613  Weight: 80.9 kg (178 lb 5.6 oz) 82.8 kg (182 lb 8.7 oz) 82.8 kg (182 lb 8.7 oz)   Body mass index is 24.09 kg/(m^2).   Gen Exam: Awake and alert, cervical collar in place Chest: B/L Clear anteriorly CVS: S1 S2 Regular Abdomen: soft, BS +, non tender, non distended.  Extremities: no edema, lower extremities warm to touch. Neurologic: Dense left hemiparesis-with left hemi-neglect. Skin: No Rash.   Wounds: N/A.    Intake/Output from previous day: No intake or output data in the 24 hours ending 09/27/15 1010   LAB RESULTS: CBC  Recent Labs Lab 09/22/15 0602 09/26/15 0618  WBC 7.6 7.1  HGB 10.9* 10.5*  HCT 33.8* 32.0*  PLT 153 145*  MCV 93.4 93.8  MCH 30.1 30.8  MCHC 32.2 32.8  RDW 16.4* 16.5*    Chemistries   Recent Labs Lab 09/22/15 0602 09/23/15 0614 09/26/15 0618  NA 133* 133* 134*  K 5.3* 4.1 3.7  CL 101 99* 99*  CO2 26 28 29   GLUCOSE 138* 179* 163*  BUN 22* 23* 22*  CREATININE 0.80 0.84 0.67  CALCIUM 8.1* 7.9* 8.3*    CBG:  Recent Labs Lab 09/26/15 1621 09/26/15 1958 09/27/15 0120 09/27/15 0404 09/27/15 0752  GLUCAP 153* 168* 230* 166* 154*    GFR Estimated Creatinine Clearance:  79.1 mL/min (by C-G formula based on Cr of 0.67).  Coagulation profile  Recent Labs Lab 09/24/15 1210  INR 1.19    Cardiac Enzymes No results for input(s): CKMB, TROPONINI, MYOGLOBIN in the last 168 hours.  Invalid input(s): CK  Invalid input(s): POCBNP No results for input(s): DDIMER in the last 72 hours. No results for input(s): HGBA1C in the last 72 hours. No results for input(s): CHOL, HDL, LDLCALC, TRIG, CHOLHDL, LDLDIRECT in the last 72 hours. No results for input(s): TSH, T4TOTAL, T3FREE, THYROIDAB in the last 72 hours.  Invalid input(s): FREET3 No results for input(s): VITAMINB12, FOLATE, FERRITIN, TIBC, IRON, RETICCTPCT in the last 72 hours. No results for input(s): LIPASE, AMYLASE in the last 72 hours.  Urine Studies No results for input(s): UHGB, CRYS in the last 72 hours.  Invalid input(s): UACOL, UAPR, USPG, UPH, UTP, UGL, UKET, UBIL, UNIT, UROB, ULEU, UEPI, UWBC, URBC, UBAC, CAST, UCOM, BILUA  MICROBIOLOGY: No results found for this or any previous visit (from the past 240 hour(s)).  RADIOLOGY STUDIES/RESULTS: Dg Cervical Spine  1 View  09/06/2015   CLINICAL DATA:  ACDF C2-3 and C4-5  EXAM: DG C-ARM 61-120 MIN; DG CERVICAL SPINE - 1 VIEW  COMPARISON:  None.  FLUOROSCOPY TIME:  10 seconds  One image  FINDINGS: Single lateral intraoperative fluoroscopic image is provided of the cervical spine. Anterior cervical fusion with interbody spacer device at C2-3 and C4-5.  IMPRESSION: ACDF C2-3 and C4-5.   Electronically Signed   By: Kathreen Devoid   On: 09/06/2015 12:43   Dg Abd 1 View  09/15/2015   CLINICAL DATA:  Feeding tube placement  EXAM: ABDOMEN - 1 VIEW  COMPARISON:  09/13/2015  TECHNIQUE: Twelve French feeding tube placed under fluoroscopy by Baxter Flattery Dingus RT-R.  20 mL of Omnipaque 300 was utilized to confirm placement.  FLUOROSCOPY TIME:  5 minutes 12 seconds  FINDINGS: Contrast opacifies second third portions the duodenum as well as RIGHT proximal jejunal loop.   Feeding tube is placed with the tip at/beyond ligament of Treitz.  IMPRESSION: Placement of feeding tube with tip at/beyond ligament of Treitz.   Electronically Signed   By: Lavonia Dana M.D.   On: 09/15/2015 14:49   Dg Abd 1 View  09/10/2015   CLINICAL DATA:  Nasoenteric feeding tube placement by RT  EXAM: ABDOMEN - 1 VIEW  COMPARISON:  06/15/2006  FINDINGS: Feeding tube extends to the ligament of Treitz, confirmed with contrast injection.  IMPRESSION: Feeding tube placement to ligament of Treitz.   Electronically Signed   By: Lucrezia Europe M.D.   On: 09/10/2015 13:26   Ct Head Wo Contrast  09/06/2015   CLINICAL DATA:  Left-sided weakness following cervical surgery  EXAM: CT HEAD WITHOUT CONTRAST  TECHNIQUE: Contiguous axial images were obtained from the base of the skull through the vertex without intravenous contrast.  COMPARISON:  06/27/2013  FINDINGS: The bony calvarium is intact. No gross soft tissue abnormality is seen. Mild atrophic changes are noted. There is some generalized decreased attenuation identified in the distribution of the right middle cerebral artery suggestive of a early ischemic event. MRI may be helpful further evaluation. No other focal areas of ischemia or hemorrhage are noted.  IMPRESSION: Chronic atrophic changes.  Generalized decreased attenuation in the distribution of the right middle cerebral artery suggestive of early ischemia. MRI may be helpful for further evaluation.  These results were called by telephone at the time of interpretation on 09/06/2015 at 5:34 pm to [facility], the pts nurse, who verbally acknowledged these results.   Electronically Signed   By: Inez Catalina M.D.   On: 09/06/2015 17:37   Mr Virgel Paling Wo Contrast  09/08/2015   CLINICAL DATA:  81 year old male with left side weakness following cervical spine and lumbar surgery. Initial encounter.  EXAM: MRI HEAD WITHOUT CONTRAST AND WITH CONTRAST  MRA HEAD WITHOUT CONTRAST  MRA NECK WITHOUT AND WITH CONTRAST   TECHNIQUE: Multiplanar, multiecho pulse sequences of the brain and surrounding structures were obtained without intravenous contrast. Angiographic images of the Circle of Willis were obtained using MRA technique without intravenous contrast. Angiographic images of the neck were obtained using MRA technique without and with intravenous contrast. Carotid stenosis measurements (when applicable) are obtained utilizing NASCET criteria, using the distal internal carotid diameter as the denominator.  CONTRAST:  56m MULTIHANCE GADOBENATE DIMEGLUMINE 529 MG/ML IV SOLN  COMPARISON:  Head CT without contrast 09/06/2015. Preoperative Cervical spine MRI 08/12/2015  FINDINGS: MRI HEAD FINDINGS  Confluent restricted diffusion throughout most of the right hemisphere. Near complete involvement  of the right MCA territory. Right MCA/PCA watershed involvement extending to the lateral occipital pole. Scattered involvement elsewhere in the right PCA territory. Some of the right temporal lobe also is spared.  Scattered small areas of restricted diffusion also in the left MCA territory, an the right superior cerebellar artery territory.  Major intracranial vascular flow voids are preserved, see MRA findings below.  Confluent cytotoxic edema in the right hemisphere. Areas of petechial hemorrhage in the anterior and posterior right MCA territories. Mass effect on the right lateral ventricle. Mild leftward midline shift of 3 mm. No ventriculomegaly. Basilar cisterns are patent. No extra-axial or intraventricular hemorrhage. Following contrast, no significant post ischemic enhancement.  Minimal other nonspecific cerebral white matter T2 and FLAIR hyperintensity. There is a small chronic lacunar infarct in the right PICA territory (series 6, image 5).  Visible internal auditory structures appear normal. Trace mastoid fluid. Mild paranasal sinus mucosal thickening. The patient is intubated. Trace fluid in the oral cavity. Postoperative changes  to the globes. Negative scalp soft tissues.  Hardware susceptibility artifact in the cervical spine. Chronic spinal Stenosis with mass effect on the spinal cord at C2-C3 (series 3, image 13).  MRA NECK FINDINGS  Precontrast time-of-flight imaging. A antegrade flow in both carotid and vertebral arteries in the neck and to the skullbase.  Post-contrast neck MRA images. Three vessel arch configuration. No great vessel origin stenosis.  Negative right CCA proximal to the right carotid bifurcation. At the right ICA origin there is irregularity and stenosis of up to 60 % with respect to the distal vessel (series 1302, image 84 and series 11304, image 5). The cervical right ICA is patent and otherwise appears negative to the skullbase.  Negative left CCA. Irregularity and stenosis at the left ICA origin also measuring up to 60 % with respect to the distal vessel and best seen on series 1302, image 71. The left ICA remains patent and is otherwise negative to the skullbase.  No proximal subclavian artery stenosis. Normal left vertebral artery origin. Mild if any stenosis at the right vertebral artery origin. The left vertebral artery is mildly dominant throughout.  MRA HEAD FINDINGS  Antegrade flow in the posterior circulation. Dominant distal left vertebral artery. Normal right PICA origin. Dominant appearing left AICA with a patent origin. Patent vertebrobasilar junction. No basilar stenosis. Normal SCA and PCA origins, fetal type on the right. Both posterior communicating arteries are present. Bilateral PCA branches are within normal limits.  Antegrade flow in both ICA siphons. No siphon stenosis. Ophthalmic and posterior communicating artery origins are within normal limits. Small probable atherosclerotic pseudo lesion of the left ICA siphon in the superior hypophyseal region (series 505, image 11). Patent carotid termini. Normal MCA and ACA origins.  Anterior communicating artery and visualized ACA branches are within  normal limits. Left MCA M1 segment and bifurcation are within normal limits. No left MCA branch occlusion identified. Right MCA M1 segment and bifurcation are within normal limits. No major right MCA branch occlusion identified.  IMPRESSION: 1. 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 2. Scattered small left MCA and occasional right SCA acute infarcts. 3. Bilateral ICA origin stenosis of up to 60% in the neck. No emergent large vessel occlusion. No intracranial stenosis or MCA branch occlusion identified. 4. Chronic spinal stenosis with spinal cord mass effect at C2-C3. New upper cervical fusion hardware.   Electronically  Signed   By: Genevie Ann M.D.   On: 09/08/2015 10:20   Mr Angiogram Neck W Wo Contrast  09/08/2015   CLINICAL DATA:  81 year old male with left side weakness following cervical spine and lumbar surgery. Initial encounter.  EXAM: MRI HEAD WITHOUT CONTRAST AND WITH CONTRAST  MRA HEAD WITHOUT CONTRAST  MRA NECK WITHOUT AND WITH CONTRAST  TECHNIQUE: Multiplanar, multiecho pulse sequences of the brain and surrounding structures were obtained without intravenous contrast. Angiographic images of the Circle of Willis were obtained using MRA technique without intravenous contrast. Angiographic images of the neck were obtained using MRA technique without and with intravenous contrast. Carotid stenosis measurements (when applicable) are obtained utilizing NASCET criteria, using the distal internal carotid diameter as the denominator.  CONTRAST:  34m MULTIHANCE GADOBENATE DIMEGLUMINE 529 MG/ML IV SOLN  COMPARISON:  Head CT without contrast 09/06/2015. Preoperative Cervical spine MRI 08/12/2015  FINDINGS: MRI HEAD FINDINGS  Confluent restricted diffusion throughout most of the right hemisphere. Near complete involvement of the right MCA territory. Right MCA/PCA watershed involvement  extending to the lateral occipital pole. Scattered involvement elsewhere in the right PCA territory. Some of the right temporal lobe also is spared.  Scattered small areas of restricted diffusion also in the left MCA territory, an the right superior cerebellar artery territory.  Major intracranial vascular flow voids are preserved, see MRA findings below.  Confluent cytotoxic edema in the right hemisphere. Areas of petechial hemorrhage in the anterior and posterior right MCA territories. Mass effect on the right lateral ventricle. Mild leftward midline shift of 3 mm. No ventriculomegaly. Basilar cisterns are patent. No extra-axial or intraventricular hemorrhage. Following contrast, no significant post ischemic enhancement.  Minimal other nonspecific cerebral white matter T2 and FLAIR hyperintensity. There is a small chronic lacunar infarct in the right PICA territory (series 6, image 5).  Visible internal auditory structures appear normal. Trace mastoid fluid. Mild paranasal sinus mucosal thickening. The patient is intubated. Trace fluid in the oral cavity. Postoperative changes to the globes. Negative scalp soft tissues.  Hardware susceptibility artifact in the cervical spine. Chronic spinal Stenosis with mass effect on the spinal cord at C2-C3 (series 3, image 13).  MRA NECK FINDINGS  Precontrast time-of-flight imaging. A antegrade flow in both carotid and vertebral arteries in the neck and to the skullbase.  Post-contrast neck MRA images. Three vessel arch configuration. No great vessel origin stenosis.  Negative right CCA proximal to the right carotid bifurcation. At the right ICA origin there is irregularity and stenosis of up to 60 % with respect to the distal vessel (series 1302, image 84 and series 11304, image 5). The cervical right ICA is patent and otherwise appears negative to the skullbase.  Negative left CCA. Irregularity and stenosis at the left ICA origin also measuring up to 60 % with respect to the  distal vessel and best seen on series 1302, image 71. The left ICA remains patent and is otherwise negative to the skullbase.  No proximal subclavian artery stenosis. Normal left vertebral artery origin. Mild if any stenosis at the right vertebral artery origin. The left vertebral artery is mildly dominant throughout.  MRA HEAD FINDINGS  Antegrade flow in the posterior circulation. Dominant distal left vertebral artery. Normal right PICA origin. Dominant appearing left AICA with a patent origin. Patent vertebrobasilar junction. No basilar stenosis. Normal SCA and PCA origins, fetal type on the right. Both posterior communicating arteries are present. Bilateral PCA branches are within normal limits.  Antegrade flow in both  ICA siphons. No siphon stenosis. Ophthalmic and posterior communicating artery origins are within normal limits. Small probable atherosclerotic pseudo lesion of the left ICA siphon in the superior hypophyseal region (series 505, image 11). Patent carotid termini. Normal MCA and ACA origins.  Anterior communicating artery and visualized ACA branches are within normal limits. Left MCA M1 segment and bifurcation are within normal limits. No left MCA branch occlusion identified. Right MCA M1 segment and bifurcation are within normal limits. No major right MCA branch occlusion identified.  IMPRESSION: 1. 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 2. Scattered small left MCA and occasional right SCA acute infarcts. 3. Bilateral ICA origin stenosis of up to 60% in the neck. No emergent large vessel occlusion. No intracranial stenosis or MCA branch occlusion identified. 4. Chronic spinal stenosis with spinal cord mass effect at C2-C3. New upper cervical fusion hardware.   Electronically Signed   By: Genevie Ann M.D.   On: 09/08/2015 10:20   Mr Jeri Cos QZ Contrast  09/08/2015   CLINICAL  DATA:  81 year old male with left side weakness following cervical spine and lumbar surgery. Initial encounter.  EXAM: MRI HEAD WITHOUT CONTRAST AND WITH CONTRAST  MRA HEAD WITHOUT CONTRAST  MRA NECK WITHOUT AND WITH CONTRAST  TECHNIQUE: Multiplanar, multiecho pulse sequences of the brain and surrounding structures were obtained without intravenous contrast. Angiographic images of the Circle of Willis were obtained using MRA technique without intravenous contrast. Angiographic images of the neck were obtained using MRA technique without and with intravenous contrast. Carotid stenosis measurements (when applicable) are obtained utilizing NASCET criteria, using the distal internal carotid diameter as the denominator.  CONTRAST:  34m MULTIHANCE GADOBENATE DIMEGLUMINE 529 MG/ML IV SOLN  COMPARISON:  Head CT without contrast 09/06/2015. Preoperative Cervical spine MRI 08/12/2015  FINDINGS: MRI HEAD FINDINGS  Confluent restricted diffusion throughout most of the right hemisphere. Near complete involvement of the right MCA territory. Right MCA/PCA watershed involvement extending to the lateral occipital pole. Scattered involvement elsewhere in the right PCA territory. Some of the right temporal lobe also is spared.  Scattered small areas of restricted diffusion also in the left MCA territory, an the right superior cerebellar artery territory.  Major intracranial vascular flow voids are preserved, see MRA findings below.  Confluent cytotoxic edema in the right hemisphere. Areas of petechial hemorrhage in the anterior and posterior right MCA territories. Mass effect on the right lateral ventricle. Mild leftward midline shift of 3 mm. No ventriculomegaly. Basilar cisterns are patent. No extra-axial or intraventricular hemorrhage. Following contrast, no significant post ischemic enhancement.  Minimal other nonspecific cerebral white matter T2 and FLAIR hyperintensity. There is a small chronic lacunar infarct in the right PICA  territory (series 6, image 5).  Visible internal auditory structures appear normal. Trace mastoid fluid. Mild paranasal sinus mucosal thickening. The patient is intubated. Trace fluid in the oral cavity. Postoperative changes to the globes. Negative scalp soft tissues.  Hardware susceptibility artifact in the cervical spine. Chronic spinal Stenosis with mass effect on the spinal cord at C2-C3 (series 3, image 13).  MRA NECK FINDINGS  Precontrast time-of-flight imaging. A antegrade flow in both carotid and vertebral arteries in the neck and to the skullbase.  Post-contrast neck MRA images. Three vessel arch configuration. No great vessel origin stenosis.  Negative right CCA proximal to the right carotid bifurcation. At the right ICA origin there is irregularity  and stenosis of up to 60 % with respect to the distal vessel (series 1302, image 84 and series 11304, image 5). The cervical right ICA is patent and otherwise appears negative to the skullbase.  Negative left CCA. Irregularity and stenosis at the left ICA origin also measuring up to 60 % with respect to the distal vessel and best seen on series 1302, image 71. The left ICA remains patent and is otherwise negative to the skullbase.  No proximal subclavian artery stenosis. Normal left vertebral artery origin. Mild if any stenosis at the right vertebral artery origin. The left vertebral artery is mildly dominant throughout.  MRA HEAD FINDINGS  Antegrade flow in the posterior circulation. Dominant distal left vertebral artery. Normal right PICA origin. Dominant appearing left AICA with a patent origin. Patent vertebrobasilar junction. No basilar stenosis. Normal SCA and PCA origins, fetal type on the right. Both posterior communicating arteries are present. Bilateral PCA branches are within normal limits.  Antegrade flow in both ICA siphons. No siphon stenosis. Ophthalmic and posterior communicating artery origins are within normal limits. Small probable  atherosclerotic pseudo lesion of the left ICA siphon in the superior hypophyseal region (series 505, image 11). Patent carotid termini. Normal MCA and ACA origins.  Anterior communicating artery and visualized ACA branches are within normal limits. Left MCA M1 segment and bifurcation are within normal limits. No left MCA branch occlusion identified. Right MCA M1 segment and bifurcation are within normal limits. No major right MCA branch occlusion identified.  IMPRESSION: 1. 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 2. Scattered small left MCA and occasional right SCA acute infarcts. 3. Bilateral ICA origin stenosis of up to 60% in the neck. No emergent large vessel occlusion. No intracranial stenosis or MCA branch occlusion identified. 4. Chronic spinal stenosis with spinal cord mass effect at C2-C3. New upper cervical fusion hardware.   Electronically Signed   By: Genevie Ann M.D.   On: 09/08/2015 10:20   Ir Gastrostomy Tube Mod Sed  09/24/2015   CLINICAL DATA:  Cerebral infarction and need for gastrostomy tube for long-term nutrition.  EXAM: PERCUTANEOUS GASTROSTOMY TUBE PLACEMENT  ANESTHESIA/SEDATION: 1.0 mg IV Versed; 50 mcg IV Fentanyl.  Total Moderate Sedation Time  20 minutes.  CONTRAST:  48m OMNIPAQUE IOHEXOL 300 MG/ML  SOLN  MEDICATIONS: 1 g IV vancomycin. Vancomycin was given within two hours of incision. Vancomycin was given due to an antibiotic allergy.  FLUOROSCOPY TIME:  7 minutes and 6 seconds.  PROCEDURE: The procedure, risks, benefits, and alternatives were explained to the patient's family. Questions regarding the procedure were encouraged and answered. The patient's grandson understands and consented to the procedure.  The evening prior to the procedure, thin liquid barium was administered through an indwelling post pyloric nasal feeding tube in order to opacify the colon. A  5-French catheter was advanced through the the patient's mouth under fluoroscopy into the esophagus and to the level of the stomach. This catheter was used to insufflate the stomach with air under fluoroscopy.  The abdominal wall was prepped with Betadine in a sterile fashion, and a sterile drape was applied covering the operative field. A sterile gown and sterile gloves were used for the procedure. Local anesthesia was provided with 1% Lidocaine.  A skin incision was made in the upper abdominal wall. Under fluoroscopy, an 18 gauge trocar needle was advanced into the stomach. Contrast  injection was performed to confirm intraluminal position of the needle tip. A single T tack was then deployed in the lumen of the stomach. This was brought up to tension at the skin surface.  Over a guidewire, a 9-French sheath was advanced into the lumen of the stomach. The wire was left in place as a safety wire. A loop snare device from a percutaneous gastrostomy kit was then advanced into the stomach.  A floppy guide wire was advanced through the orogastric catheter under fluoroscopy in the stomach. The loop snare advanced through the percutaneous gastric access was used to snare the guide wire. This allowed withdrawal of the loop snare out of the patient's mouth by retraction of the orogastric catheter and wire.  A 20-French bumper retention gastrostomy tube was looped around the snare device. It was then pulled back through the patient's mouth. The retention bumper was brought up to the anterior gastric wall. The T tack suture was cut at the skin. The exiting gastrostomy tube was cut to appropriate length and a feeding adapter applied. The catheter was injected with contrast material to confirm position and a fluoroscopic spot image saved. The tube was then flushed with saline. A dressing was applied over the gastrostomy exit site.  COMPLICATIONS: None.  FINDINGS: Initial fluoroscopy demonstrates adequate opacification of the  colon by ingested barium in order to prevent colonic injury during the procedure. The stomach distended well with air allowing safe placement of the gastrostomy tube. After placement, the tip of the gastrostomy tube lies in the body of the stomach.  IMPRESSION: Percutaneous gastrostomy with placement of a 20-French bumper retention tube in the body of the stomach. This tube can be used for percutaneous feeds beginning in 24 hours after placement.   Electronically Signed   By: Aletta Edouard M.D.   On: 09/24/2015 11:20   Dg Lumbar Spine 1 View  09/06/2015   CLINICAL DATA:  Laminectomy.  EXAM: LUMBAR SPINE - 1 VIEW  COMPARISON:  None.  FINDINGS: Metallic patch that lumbar vertebra are numbered with the lowest lumbar shaped vertebra as L5. This may be a transitional vertebra. 11 mm anterolisthesis of L4 on L5 noted. Metallic markers noted posteriorly at the L4-L5 level  IMPRESSION: Metallic marker noted posteriorly at the L4-L5 level.   Electronically Signed   By: Marcello Moores  Register   On: 09/06/2015 13:28   Dg Chest Port 1 View  09/13/2015   CLINICAL DATA:  81 year old male with respiratory failure. Subsequent encounter.  EXAM: PORTABLE CHEST 1 VIEW  COMPARISON:  09/11/2015.  FINDINGS: Poor inspiration with consolidation lung bases greater on left. This may represent atelectasis or infiltrate and without significant change.  Central pulmonary vascular prominence.  Prominent osteophyte mid thoracic vertebra, better evaluated on 08/12/2015 MR.  Mild cardiomegaly.  No gross pneumothorax.  IMPRESSION: Poor inspiration with consolidation lung bases greater on left. This may represent atelectasis or infiltrate and without significant change.  Central pulmonary vascular prominence.  Prominent osteophyte mid thoracic vertebra, better evaluated on 08/12/2015 MR.  Mild cardiomegaly.   Electronically Signed   By: Genia Del M.D.   On: 09/13/2015 07:59   Dg Chest Port 1 View  09/12/2015   CLINICAL DATA:  Fever today.   EXAM: PORTABLE CHEST 1 VIEW  COMPARISON:  09/09/2015.  FINDINGS: Normal sized heart. Decreased patchy and linear density at the right lung base. Stable patchy density at the left lung base. Thoracic spine degenerative changes.  IMPRESSION: 1. Persistent patchy density at the left  lung base, suspicious for pneumonia. 2. Decreased atelectasis at the right lung base with residual patchy pneumonia or atelectasis   Electronically Signed   By: Claudie Revering M.D.   On: 09/12/2015 03:04   Dg Chest Port 1 View  09/09/2015   CLINICAL DATA:  Acute respiratory failure  EXAM: PORTABLE CHEST - 1 VIEW  COMPARISON:  09/08/2015  FINDINGS: Low lung volumes are present, causing crowding of the pulmonary vasculature. Airspace opacities at the left lung base and left infrahilar region, slightly worsened. Minimal right basilar airspace opacity medially.  Thoracic spondylosis. Borderline enlargement of the cardiopericardial silhouette.  Endotracheal and nasogastric tubes have been removed.  IMPRESSION: 1. Bibasilar airspace opacities, left greater than right, mildly worsened. 2. Borderline enlargement of the cardiopericardial silhouette.   Electronically Signed   By: Van Clines M.D.   On: 09/09/2015 08:08   Dg Chest Port 1 View  09/08/2015   CLINICAL DATA:  Acute respiratory failure portable chest x-ray of 09/07/2015  EXAM: PORTABLE CHEST - 1 VIEW  COMPARISON:  None.  FINDINGS: There is persistent basilar atelectasis left-greater-than-right. A small left effusion cannot be excluded. The right lung is grossly clear. Endotracheal tube and NG tube remain, unchanged in position.  IMPRESSION: Persistent left basilar atelectasis.  Stable mild cardiomegaly.   Electronically Signed   By: Ivar Drape M.D.   On: 09/08/2015 08:39   Portable Chest Xray  09/07/2015   CLINICAL DATA:  Respiratory failure, ventilatory support  EXAM: PORTABLE CHEST - 1 VIEW  COMPARISON:  09/06/2015  FINDINGS: Endotracheal tube 7.8 cm above the carina. NG  tube enters the stomach with the tip not visualized. Increased left basilar opacity/atelectasis. Right lung remains clear. No enlarging effusion or pneumothorax. Degenerative changes of the spine. Atherosclerosis of the aorta.  IMPRESSION: Stable support apparatus.  Increased left basilar atelectasis   Electronically Signed   By: Jerilynn Mages.  Shick M.D.   On: 09/07/2015 08:20   Dg Chest Port 1 View  09/06/2015   CLINICAL DATA:  Intubation orogastric tube placement  EXAM: PORTABLE CHEST - 1 VIEW  COMPARISON:  08/01/2010  FINDINGS: Endotracheal tube is 3.9 cm above the carina. Orogastric tube crosses the gastroesophageal junction with the. Mild left base opacity suggesting small effusion. Heart size and vascular pattern normal. Right lung clear. Mild atelectasis left lung base.  IMPRESSION: Lines and tubes as described above.   Electronically Signed   By: Skipper Cliche M.D.   On: 09/06/2015 16:45   Dg Abd Portable 1v  09/24/2015   CLINICAL DATA:  Assess the patient for barium prior to gastrostomy tube placement.  EXAM: PORTABLE ABDOMEN - 1 VIEW  COMPARISON:  None.  FINDINGS: There is dense barium throughout the colon. No significant barium remains in the stomach. The feeding tube tip projects over the proximal jejunum. There are a few mildly distended gas-filled small bowel loops evident. There are degenerative changes of the lumbar spine.  IMPRESSION: No significant contrast remains in the stomach or small bowel. There is dense barium in the colon.   Electronically Signed   By: David  Martinique M.D.   On: 09/24/2015 07:48   Dg Abd Portable 1v  09/13/2015   CLINICAL DATA:  Nasogastric tube placement.  EXAM: PORTABLE ABDOMEN - 1 VIEW  COMPARISON:  09/10/2015 and chest x-ray today.  FINDINGS: There has been placement of a enteric tube with tip over the stomach in the left upper quadrant. Bowel gas pattern is nonobstructive. Persistent left base opacification. Moderate degenerative change of  the spine.  IMPRESSION:  Nonobstructive bowel gas pattern.  Enteric feeding tube with tip over the stomach in the left upper quadrant.   Electronically Signed   By: Marin Olp M.D.   On: 09/13/2015 09:58   Dg Addison Bailey G Tube Plc W/fl-no Rad  09/15/2015   CLINICAL DATA:    NASO G TUBE PLACEMENT WITH FLUORO  Fluoroscopy was utilized by the requesting physician.  No radiographic  interpretation.    Dg Naso G Tube Plc W/fl-no Rad  09/10/2015   CLINICAL DATA:    NASO G TUBE PLACEMENT WITH FLUORO  Fluoroscopy was utilized by the requesting physician.  No radiographic  interpretation.    Dg Swallowing Func-speech Pathology  09/17/2015    Objective Swallowing Evaluation:    Patient Details  Name: [name] MRN: [id number] Date of Birth: November 15, 1930  Today's Date: 09/17/2015 Time: SLP Start Time (ACUTE ONLY): 1030-SLP Stop Time (ACUTE ONLY): 1115 SLP Time Calculation (min) (ACUTE ONLY): 45 min  Past Medical History:  Past Medical History  Diagnosis Date  . Hypertension   . Diabetes mellitus without complication   . DDD (degenerative disc disease)   . Vertigo   . High cholesterol   . Peripheral vascular disease   . Pneumonia   . History of kidney stones   . Hx of gallstones   . GERD (gastroesophageal reflux disease)   . Headache   . Wears dentures    Past Surgical History:  Past Surgical History  Procedure Laterality Date  . Cholecystectomy    . Skin graft      Left leg  . Vein surgery    . Diagnostic laparoscopy    . Multiple tooth extractions    . Vascular surgery    . Eye surgery Bilateral   . Anterior cervical decomp/discectomy fusion N/A 09/06/2015    Procedure: Anterior Cervical Decompression Fusion Cervical two,Cervical  three,Cervical four-,Cervical five ;  Surgeon: Earnie Larsson, MD;  Location:  [facility];  Service: Neurosurgery;  Laterality: N/A;  . Lumbar laminectomy/decompression microdiscectomy N/A 09/06/2015    Procedure: Lumbar three-four  decompresssive laminectomy  ;  Surgeon:  Earnie Larsson, MD;  Location: [facility];   Service: Neurosurgery;   Laterality: N/A;   HPI:  Other Pertinent Information: JOSHA WEEKLEY is an 81 y.o. male with a  past medical history significant for HTN, DM, hypercholesterolemia, PAD,  GERD, who underwent C2-3 and C4-5 anterior cervical discectomy with  interbody fusion as well as L3-4 decompressive laminectomy with bilateral  L3 and L4 foraminotomies on 9/19, and few hours postoperatively was noted  to have significant left hemiparesis that prompted CT brain which  demonstrated findings concerning for acute ischemia in the right MCA  territory. Pt remained on the vent post-operatively 9/19-9/21.  No Data Recorded  Assessment / Plan / Recommendation CHL IP CLINICAL IMPRESSIONS 09/17/2015  Therapy Diagnosis Severe cervical esophageal phase dysphagia;Severe  pharyngeal phase dysphagia  Clinical Impression Pt demonstrates minimal improvement in function since  prior MBS. Standing secretions have improved and edema has decreased, but  presence of cervical hardware at C4/5 on top of large bony protrusion  impedes epiglottic deflection and restricts opening of the cervical  esophagus. Thin and nectar thick liquids are significantly aspirated with  insufficient ability to expectorate. Puree textures and honey thick  liquids with large teaspoon boluses are heavy enough to trigger better  opeing, but still significant oropharyngeal residual places pt at high  risk of aspiration. With approval from Dr.  Pool the pts hard collar was  removed to attempt a head turn/chin tuck, but pts movement was still  restricted and no benefit was evidenced. If pt and family choose to accept  risk of aspiration, a puree/and honey thick diet could be initiated and  trialed. If the pt wishes for more aggressive measures, then he should  remain NPO. Would not expect any more dramatic improvement in short term  and long term alternate nutrition would be needed, though risk of  aspiration of secretions will still be high.       CHL IP  TREATMENT RECOMMENDATION 09/17/2015  Treatment Recommendations Therapy as outlined in treatment plan below     CHL IP DIET RECOMMENDATION 09/17/2015  SLP Diet Recommendations Dysphagia 1 (Puree);Honey  Liquid Administration via (None)  Medication Administration Crushed with puree  Compensations Multiple dry swallows after each bite/sip  Postural Changes and/or Swallow Maneuvers (None)     CHL IP OTHER RECOMMENDATIONS 09/17/2015  Recommended Consults (None)  Oral Care Recommendations Oral care before and after PO  Other Recommendations (None)     CHL IP FOLLOW UP RECOMMENDATIONS 09/16/2015  Follow up Recommendations Inpatient Rehab     CHL IP FREQUENCY AND DURATION 09/17/2015  Speech Therapy Frequency (ACUTE ONLY) min 2x/week  Treatment Duration 2 weeks     Pertinent Vitals/Pain NA    SLP Swallow Goals No flowsheet data found.  No flowsheet data found.    CHL IP REASON FOR REFERRAL 09/17/2015  Reason for Referral Objectively evaluate swallowing function     CHL IP ORAL PHASE 09/17/2015  Lips (None)  Tongue (None)  Mucous membranes (None)  Nutritional status (None)  Other (None)  Oxygen therapy (None)  Oral Phase WFL  Oral - Pudding Teaspoon (None)  Oral - Pudding Cup (None)  Oral - Honey Teaspoon (None)  Oral - Honey Cup (None)  Oral - Honey Syringe (None)  Oral - Nectar Teaspoon (None)  Oral - Nectar Cup (None)  Oral - Nectar Straw (None)  Oral - Nectar Syringe (None)  Oral - Ice Chips (None)  Oral - Thin Teaspoon (None)  Oral - Thin Cup (None)  Oral - Thin Straw (None)  Oral - Thin Syringe (None)  Oral - Puree (None)  Oral - Mechanical Soft (None)  Oral - Regular (None)  Oral - Multi-consistency (None)  Oral - Pill (None)  Oral Phase - Comment (None)      CHL IP PHARYNGEAL PHASE 09/17/2015  Pharyngeal Phase Impaired  Pharyngeal - Pudding Teaspoon (None)  Penetration/Aspiration details (pudding teaspoon) (None)  Pharyngeal - Pudding Cup (None)  Penetration/Aspiration details (pudding cup) (None)  Pharyngeal - Honey Teaspoon  (None)  Penetration/Aspiration details (honey teaspoon) (None)  Pharyngeal - Honey Cup (None)  Penetration/Aspiration details (honey cup) (None)  Pharyngeal - Honey Syringe (None)  Penetration/Aspiration details (honey syringe) (None)  Pharyngeal - Nectar Teaspoon (None)  Penetration/Aspiration details (nectar teaspoon) (None)  Pharyngeal - Nectar Cup (None)  Penetration/Aspiration details (nectar cup) (None)  Pharyngeal - Nectar Straw (None)  Penetration/Aspiration details (nectar straw) (None)  Pharyngeal - Nectar Syringe (None)  Penetration/Aspiration details (nectar syringe) (None)  Pharyngeal - Ice Chips (None)  Penetration/Aspiration details (ice chips) (None)  Pharyngeal - Thin Teaspoon (None)  Penetration/Aspiration details (thin teaspoon) (None)  Pharyngeal - Thin Cup (None)  Penetration/Aspiration details (thin cup) (None)  Pharyngeal - Thin Straw (None)  Penetration/Aspiration details (thin straw) (None)  Pharyngeal - Thin Syringe (None)  Penetration/Aspiration details (thin syringe') (None)  Pharyngeal - Puree (None)  Penetration/Aspiration details (puree) (None)  Pharyngeal - Mechanical Soft (None)  Penetration/Aspiration details (mechanical soft) (None)  Pharyngeal - Regular (None)  Penetration/Aspiration details (regular) (None)  Pharyngeal - Multi-consistency (None)  Penetration/Aspiration details (multi-consistency) (None)  Pharyngeal - Pill (None)  Penetration/Aspiration details (pill) (None)  Pharyngeal Comment (None)      CHL IP CERVICAL ESOPHAGEAL PHASE 09/17/2015  Cervical Esophageal Phase (No Data)  Pudding Teaspoon (None)  Pudding Cup (None)  Honey Teaspoon (None)  Honey Cup (None)  Honey Straw (None)  Nectar Teaspoon (None)  Nectar Cup (None)  Nectar Straw (None)  Nectar Sippy Cup (None)  Thin Teaspoon (None)  Thin Cup (None)  Thin Straw (None)  Thin Sippy Cup (None)  Cervical Esophageal Comment (None)    No flowsheet data found.        Herbie Baltimore, MA CCC-SLP [phone]  Lynann Beaver 09/17/2015, 1:14 PM    Dg C-arm 1-60 Min  09/06/2015   CLINICAL DATA:  ACDF C2-3 and C4-5  EXAM: DG C-ARM 61-120 MIN; DG CERVICAL SPINE - 1 VIEW  COMPARISON:  None.  FLUOROSCOPY TIME:  10 seconds  One image  FINDINGS: Single lateral intraoperative fluoroscopic image is provided of the cervical spine. Anterior cervical fusion with interbody spacer device at C2-3 and C4-5.  IMPRESSION: ACDF C2-3 and C4-5.   Electronically Signed   By: Kathreen Devoid   On: 09/06/2015 12:43    Oren Binet, MD  [facility] Pager:[phone]  If 7PM-7AM, please contact night-coverage [email or web address] Password [email or web address] 09/27/2015, 10:10 AM   LOS: 21 days

## 2015-10-19 ENCOUNTER — Encounter: Payer: Self-pay | Admitting: Vascular Surgery

## 2015-10-21 ENCOUNTER — Ambulatory Visit: Payer: Medicare Other | Admitting: Vascular Surgery

## 2015-11-08 ENCOUNTER — Ambulatory Visit: Payer: Medicare Other | Admitting: Neurology

## 2015-11-18 DEATH — deceased

## 2016-07-07 IMAGING — CR DG CHEST 1V PORT
1 series · 1 of 1 positions shown · non-contrast
Comparison: 09/11/2015.

CLINICAL DATA: 85-year-old male with respiratory failure.
Subsequent encounter.

EXAM:
PORTABLE CHEST 1 VIEW

[AP]
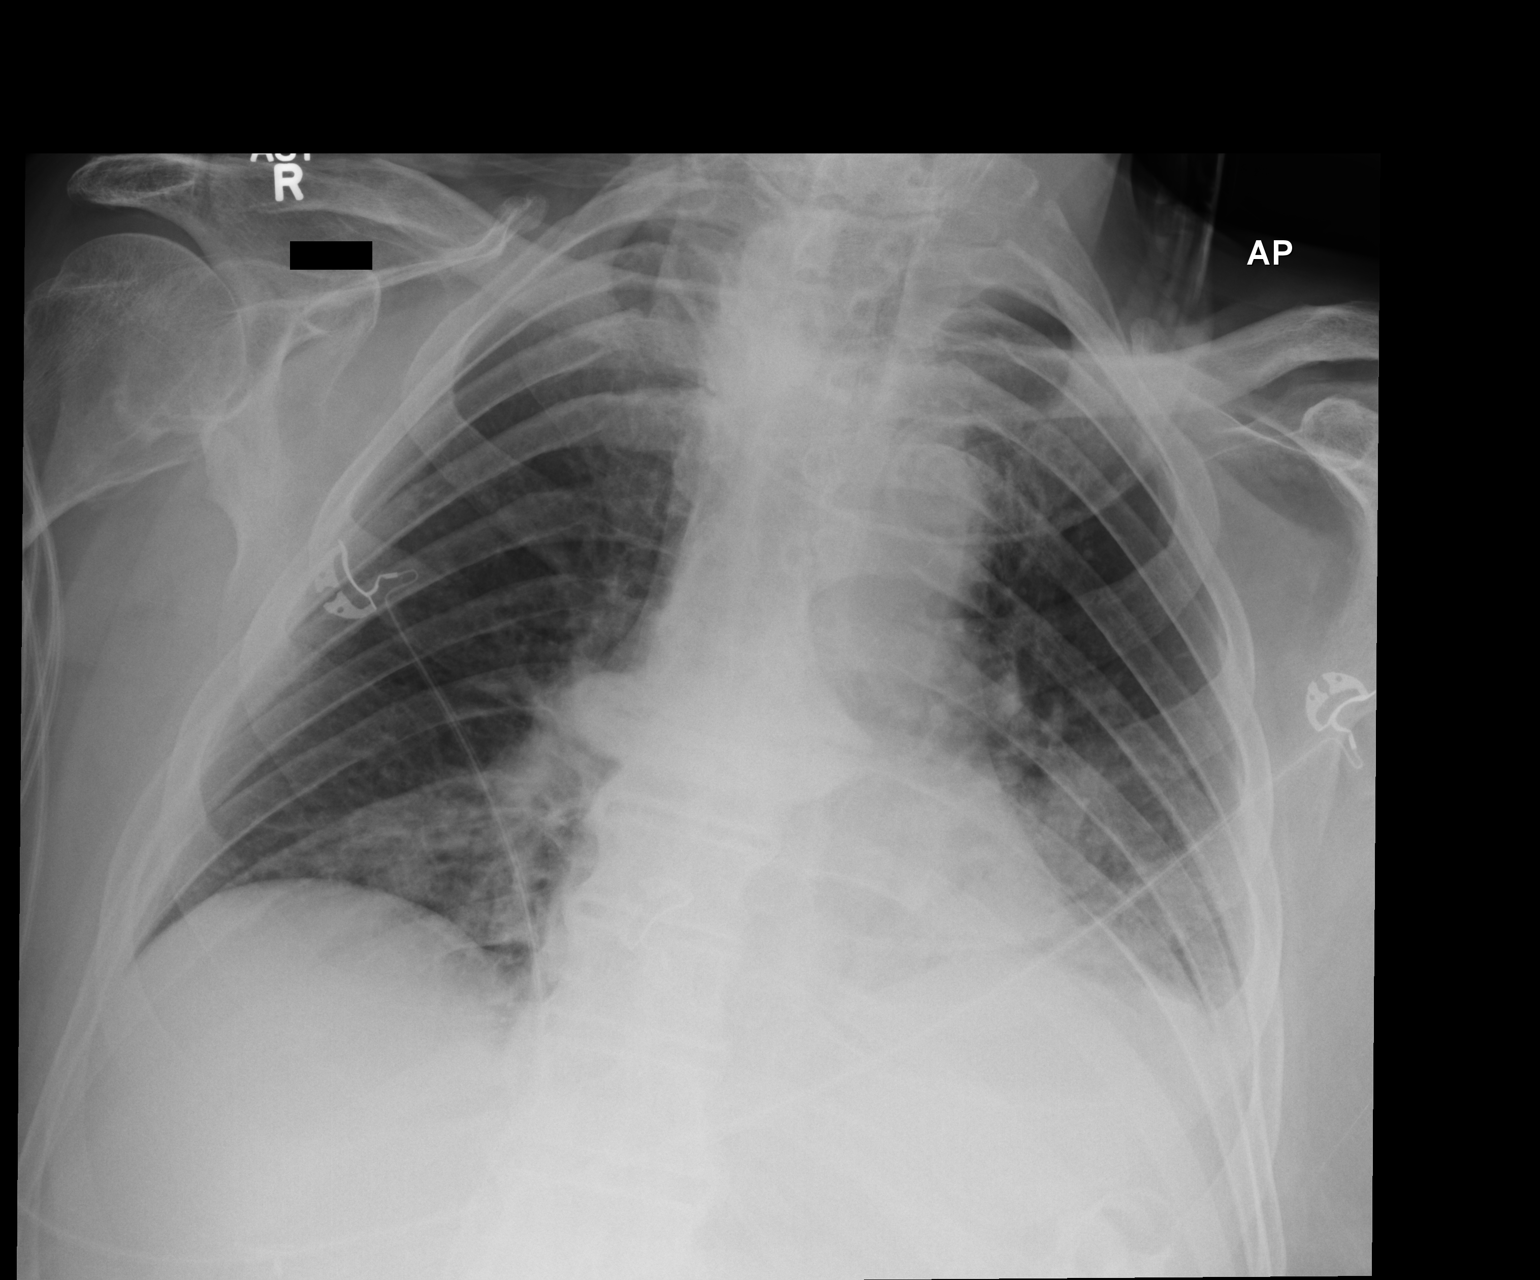

[1 of 1 positions shown; findings below may reference images not displayed]

FINDINGS: Poor inspiration with consolidation lung bases greater on left. This
may represent atelectasis or infiltrate and without significant
change.

Central pulmonary vascular prominence.

Prominent osteophyte mid thoracic vertebra, better evaluated on
08/12/2015 MR.

Mild cardiomegaly.

No gross pneumothorax.
IMPRESSION: Poor inspiration with consolidation lung bases greater on left. This
may represent atelectasis or infiltrate and without significant
change.

Central pulmonary vascular prominence.

Prominent osteophyte mid thoracic vertebra, better evaluated on
08/12/2015 MR.

Mild cardiomegaly.

## 2016-07-09 IMAGING — RF DG ABDOMEN 1V
1 series · 1 of 1 positions shown · IV contrast (omnipaque)
Comparison: 09/13/2015

CLINICAL DATA: Feeding tube placement

EXAM:
ABDOMEN - 1 VIEW
TECHNIQUE: Twelve French feeding tube placed under fluoroscopy by Ralstan Shockley
LUTHANDO.
20 mL of Omnipaque 300 was utilized to confirm placement.
FLUOROSCOPY TIME:  5 minutes 12 seconds

[Series 2: cp_standard · 0.28mm/px · 1 of 1 slices shown]
[im 1/1]
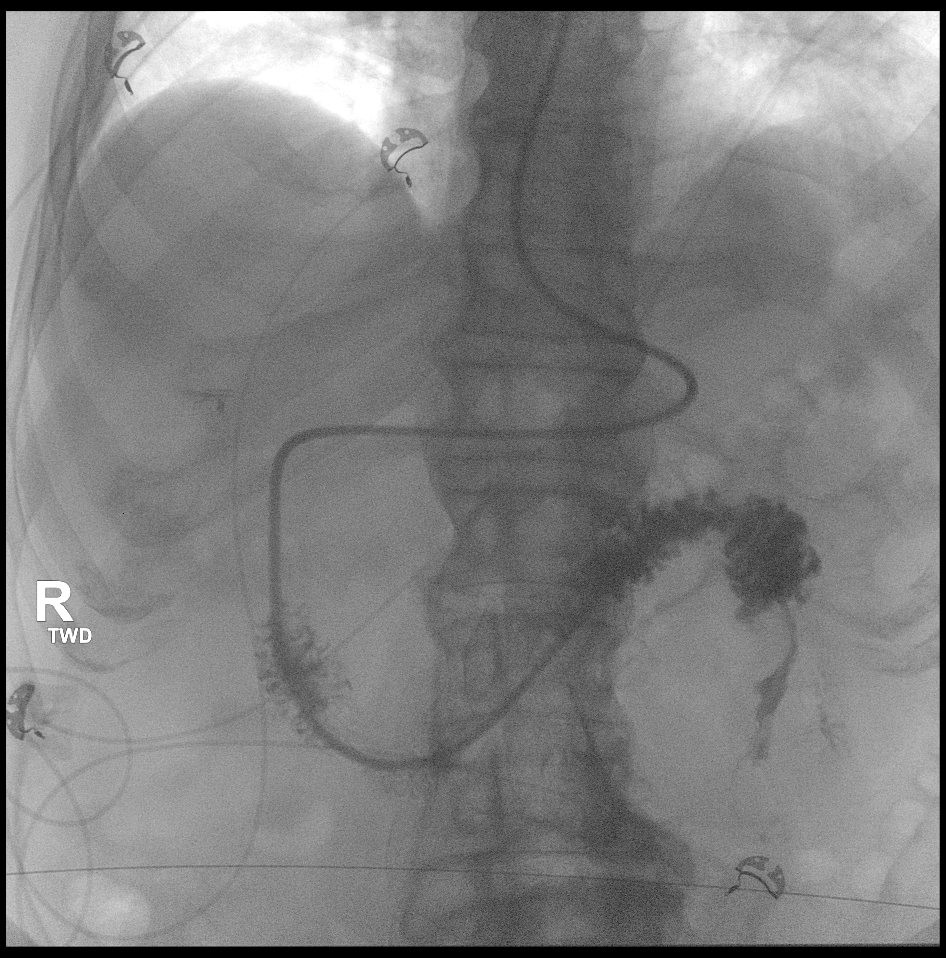

[1 of 1 positions shown; findings below may reference images not displayed]

FINDINGS: Contrast opacifies second third portions the duodenum as well as
RIGHT proximal jejunal loop.

Feeding tube is placed with the tip at/beyond ligament of Treitz.
IMPRESSION: Placement of feeding tube with tip at/beyond ligament of Treitz.
